# Patient Record
Sex: Female | Born: 1950 | Race: White | Hispanic: No | Marital: Married | Smoking: Former smoker
Health system: Southern US, Community
[De-identification: ages and names within clinical notes are randomized; demographics above are authoritative.]

## PROBLEM LIST (undated history)

## (undated) DIAGNOSIS — R32 Unspecified urinary incontinence: Secondary | ICD-10-CM

## (undated) DIAGNOSIS — E785 Hyperlipidemia, unspecified: Secondary | ICD-10-CM

## (undated) DIAGNOSIS — K5790 Diverticulosis of intestine, part unspecified, without perforation or abscess without bleeding: Secondary | ICD-10-CM

## (undated) DIAGNOSIS — M1990 Unspecified osteoarthritis, unspecified site: Secondary | ICD-10-CM

## (undated) DIAGNOSIS — K759 Inflammatory liver disease, unspecified: Secondary | ICD-10-CM

## (undated) DIAGNOSIS — J301 Allergic rhinitis due to pollen: Secondary | ICD-10-CM

## (undated) DIAGNOSIS — T8859XA Other complications of anesthesia, initial encounter: Secondary | ICD-10-CM

## (undated) DIAGNOSIS — E119 Type 2 diabetes mellitus without complications: Secondary | ICD-10-CM

## (undated) DIAGNOSIS — Z8371 Family history of colonic polyps: Secondary | ICD-10-CM

## (undated) DIAGNOSIS — T7840XA Allergy, unspecified, initial encounter: Secondary | ICD-10-CM

## (undated) DIAGNOSIS — B019 Varicella without complication: Secondary | ICD-10-CM

## (undated) DIAGNOSIS — G43909 Migraine, unspecified, not intractable, without status migrainosus: Secondary | ICD-10-CM

## (undated) DIAGNOSIS — K2270 Barrett's esophagus without dysplasia: Secondary | ICD-10-CM

## (undated) DIAGNOSIS — K5792 Diverticulitis of intestine, part unspecified, without perforation or abscess without bleeding: Secondary | ICD-10-CM

## (undated) DIAGNOSIS — H269 Unspecified cataract: Secondary | ICD-10-CM

## (undated) DIAGNOSIS — K743 Primary biliary cirrhosis: Secondary | ICD-10-CM

## (undated) DIAGNOSIS — Z83719 Family history of colon polyps, unspecified: Secondary | ICD-10-CM

## (undated) DIAGNOSIS — F419 Anxiety disorder, unspecified: Secondary | ICD-10-CM

## (undated) DIAGNOSIS — T4145XA Adverse effect of unspecified anesthetic, initial encounter: Secondary | ICD-10-CM

## (undated) DIAGNOSIS — K219 Gastro-esophageal reflux disease without esophagitis: Secondary | ICD-10-CM

## (undated) HISTORY — DX: Hyperlipidemia, unspecified: E78.5

## (undated) HISTORY — DX: Unspecified urinary incontinence: R32

## (undated) HISTORY — DX: Barrett's esophagus without dysplasia: K22.70

## (undated) HISTORY — DX: Primary biliary cirrhosis: K74.3

## (undated) HISTORY — DX: Family history of colonic polyps: Z83.71

## (undated) HISTORY — PX: VAGINAL HYSTERECTOMY: SUR661

## (undated) HISTORY — DX: Unspecified osteoarthritis, unspecified site: M19.90

## (undated) HISTORY — DX: Varicella without complication: B01.9

## (undated) HISTORY — DX: Family history of colon polyps, unspecified: Z83.719

## (undated) HISTORY — PX: JOINT REPLACEMENT: SHX530

## (undated) HISTORY — DX: Inflammatory liver disease, unspecified: K75.9

## (undated) HISTORY — DX: Diverticulitis of intestine, part unspecified, without perforation or abscess without bleeding: K57.92

## (undated) HISTORY — DX: Migraine, unspecified, not intractable, without status migrainosus: G43.909

## (undated) HISTORY — PX: ROTATOR CUFF REPAIR: SHX139

## (undated) HISTORY — PX: TUBAL LIGATION: SHX77

## (undated) HISTORY — DX: Allergic rhinitis due to pollen: J30.1

## (undated) HISTORY — PX: BLEPHAROPLASTY: SUR158

## (undated) HISTORY — DX: Diverticulosis of intestine, part unspecified, without perforation or abscess without bleeding: K57.90

## (undated) HISTORY — PX: CATARACT EXTRACTION: SUR2

## (undated) HISTORY — DX: Anxiety disorder, unspecified: F41.9

## (undated) HISTORY — DX: Unspecified cataract: H26.9

## (undated) HISTORY — PX: BLADDER SUSPENSION: SHX72

## (undated) HISTORY — DX: Type 2 diabetes mellitus without complications: E11.9

## (undated) HISTORY — DX: Allergy, unspecified, initial encounter: T78.40XA

## (undated) HISTORY — PX: ABDOMINAL HYSTERECTOMY: SHX81

## (undated) HISTORY — PX: TONSILLECTOMY: SUR1361

## (undated) HISTORY — PX: FACIAL COSMETIC SURGERY: SHX629

## (undated) HISTORY — PX: CARPAL TUNNEL RELEASE: SHX101

## (undated) HISTORY — DX: Gastro-esophageal reflux disease without esophagitis: K21.9

## (undated) HISTORY — PX: EYE SURGERY: SHX253

## (undated) HISTORY — PX: REDUCTION MAMMAPLASTY: SUR839

## (undated) HISTORY — PX: KNEE ARTHROSCOPY: SUR90

---
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
## 1982-07-10 HISTORY — PX: BREAST SURGERY: SHX581

## 1997-12-22 ENCOUNTER — Encounter: Admission: RE | Admit: 1997-12-22 | Discharge: 1997-12-22 | Payer: Self-pay | Admitting: Sports Medicine

## 1998-03-22 ENCOUNTER — Ambulatory Visit (HOSPITAL_COMMUNITY): Admission: RE | Admit: 1998-03-22 | Discharge: 1998-03-22 | Payer: Self-pay | Admitting: Gynecology

## 1998-11-22 ENCOUNTER — Ambulatory Visit (HOSPITAL_COMMUNITY): Admission: RE | Admit: 1998-11-22 | Discharge: 1998-11-22 | Payer: Self-pay | Admitting: Specialist

## 1998-11-30 ENCOUNTER — Other Ambulatory Visit: Admission: RE | Admit: 1998-11-30 | Discharge: 1998-11-30 | Payer: Self-pay | Admitting: Specialist

## 1999-01-17 ENCOUNTER — Other Ambulatory Visit: Admission: RE | Admit: 1999-01-17 | Discharge: 1999-01-17 | Payer: Self-pay | Admitting: Gynecology

## 2000-02-03 ENCOUNTER — Other Ambulatory Visit: Admission: RE | Admit: 2000-02-03 | Discharge: 2000-02-03 | Payer: Self-pay | Admitting: Gynecology

## 2000-02-16 ENCOUNTER — Ambulatory Visit (HOSPITAL_COMMUNITY): Admission: RE | Admit: 2000-02-16 | Discharge: 2000-02-16 | Payer: Self-pay | Admitting: Gynecology

## 2000-02-16 ENCOUNTER — Encounter: Payer: Self-pay | Admitting: Gynecology

## 2000-09-19 ENCOUNTER — Encounter: Admission: RE | Admit: 2000-09-19 | Discharge: 2000-09-19 | Payer: Self-pay | Admitting: Internal Medicine

## 2000-09-19 ENCOUNTER — Encounter: Payer: Self-pay | Admitting: Internal Medicine

## 2001-02-04 ENCOUNTER — Other Ambulatory Visit: Admission: RE | Admit: 2001-02-04 | Discharge: 2001-02-04 | Payer: Self-pay | Admitting: Gynecology

## 2001-02-18 ENCOUNTER — Encounter: Payer: Self-pay | Admitting: Gynecology

## 2001-02-18 ENCOUNTER — Ambulatory Visit (HOSPITAL_COMMUNITY): Admission: RE | Admit: 2001-02-18 | Discharge: 2001-02-18 | Payer: Self-pay | Admitting: Gynecology

## 2001-07-09 ENCOUNTER — Ambulatory Visit (HOSPITAL_BASED_OUTPATIENT_CLINIC_OR_DEPARTMENT_OTHER): Admission: RE | Admit: 2001-07-09 | Discharge: 2001-07-09 | Payer: Self-pay | Admitting: Orthopedic Surgery

## 2002-02-05 ENCOUNTER — Other Ambulatory Visit: Admission: RE | Admit: 2002-02-05 | Discharge: 2002-02-05 | Payer: Self-pay | Admitting: Obstetrics and Gynecology

## 2002-02-20 ENCOUNTER — Encounter: Payer: Self-pay | Admitting: Obstetrics and Gynecology

## 2002-02-20 ENCOUNTER — Ambulatory Visit (HOSPITAL_COMMUNITY): Admission: RE | Admit: 2002-02-20 | Discharge: 2002-02-20 | Payer: Self-pay | Admitting: Obstetrics and Gynecology

## 2003-02-11 ENCOUNTER — Other Ambulatory Visit: Admission: RE | Admit: 2003-02-11 | Discharge: 2003-02-11 | Payer: Self-pay | Admitting: Obstetrics and Gynecology

## 2004-02-22 ENCOUNTER — Other Ambulatory Visit: Admission: RE | Admit: 2004-02-22 | Discharge: 2004-02-22 | Payer: Self-pay | Admitting: Obstetrics and Gynecology

## 2004-02-23 ENCOUNTER — Encounter: Admission: RE | Admit: 2004-02-23 | Discharge: 2004-02-23 | Payer: Self-pay | Admitting: Obstetrics and Gynecology

## 2005-03-14 ENCOUNTER — Encounter: Admission: RE | Admit: 2005-03-14 | Discharge: 2005-03-14 | Payer: Self-pay | Admitting: Obstetrics and Gynecology

## 2005-04-04 ENCOUNTER — Other Ambulatory Visit: Admission: RE | Admit: 2005-04-04 | Discharge: 2005-04-04 | Payer: Self-pay | Admitting: Obstetrics and Gynecology

## 2005-07-06 ENCOUNTER — Ambulatory Visit (HOSPITAL_COMMUNITY): Admission: RE | Admit: 2005-07-06 | Discharge: 2005-07-06 | Payer: Self-pay | Admitting: Orthopaedic Surgery

## 2005-07-20 ENCOUNTER — Ambulatory Visit (HOSPITAL_BASED_OUTPATIENT_CLINIC_OR_DEPARTMENT_OTHER): Admission: RE | Admit: 2005-07-20 | Discharge: 2005-07-20 | Payer: Self-pay | Admitting: Orthopaedic Surgery

## 2005-08-14 ENCOUNTER — Encounter: Admission: RE | Admit: 2005-08-14 | Discharge: 2005-11-12 | Payer: Self-pay | Admitting: Orthopaedic Surgery

## 2007-01-27 ENCOUNTER — Emergency Department (HOSPITAL_COMMUNITY): Admission: EM | Admit: 2007-01-27 | Discharge: 2007-01-27 | Payer: Self-pay | Admitting: Family Medicine

## 2007-10-16 ENCOUNTER — Ambulatory Visit (HOSPITAL_COMMUNITY): Admission: RE | Admit: 2007-10-16 | Discharge: 2007-10-16 | Payer: Self-pay | Admitting: Obstetrics and Gynecology

## 2008-07-06 ENCOUNTER — Ambulatory Visit (HOSPITAL_COMMUNITY): Admission: RE | Admit: 2008-07-06 | Discharge: 2008-07-07 | Payer: Self-pay | Admitting: Obstetrics and Gynecology

## 2008-07-06 ENCOUNTER — Encounter (INDEPENDENT_AMBULATORY_CARE_PROVIDER_SITE_OTHER): Payer: Self-pay | Admitting: Obstetrics and Gynecology

## 2008-12-25 ENCOUNTER — Ambulatory Visit (HOSPITAL_COMMUNITY): Admission: RE | Admit: 2008-12-25 | Discharge: 2008-12-25 | Payer: Self-pay | Admitting: Obstetrics and Gynecology

## 2009-07-27 ENCOUNTER — Ambulatory Visit: Payer: Self-pay | Admitting: Family Medicine

## 2009-12-13 ENCOUNTER — Encounter (INDEPENDENT_AMBULATORY_CARE_PROVIDER_SITE_OTHER): Payer: Self-pay | Admitting: *Deleted

## 2010-01-13 ENCOUNTER — Encounter (INDEPENDENT_AMBULATORY_CARE_PROVIDER_SITE_OTHER): Payer: Self-pay | Admitting: *Deleted

## 2010-01-17 ENCOUNTER — Ambulatory Visit: Payer: Self-pay | Admitting: Internal Medicine

## 2010-01-31 ENCOUNTER — Ambulatory Visit: Payer: Self-pay | Admitting: Internal Medicine

## 2010-02-01 ENCOUNTER — Encounter: Payer: Self-pay | Admitting: Internal Medicine

## 2010-08-09 NOTE — Procedures (Signed)
Summary: Colonoscopy  Patient: Natasha Franco Note: All result statuses are Final unless otherwise noted.  Tests: (1) Colonoscopy (COL)   COL Colonoscopy           DONE     [facility]     [address]     [place], [place]  [address]           COLONOSCOPY PROCEDURE REPORT           PATIENT:  [name], [name]  MR#:  [id number]     BIRTHDATE:  08-11-1950, 58 yrs. old  GENDER:  female     ENDOSCOPIST:  Hedwig Morton. Juanda Chance, MD     REF. BY:  R. Robley Fries, M.D.     PROCEDURE DATE:  01/31/2010     PROCEDURE:  Colonoscopy 37106     ASA CLASS:  Class I     INDICATIONS:  Routine Risk Screening     MEDICATIONS:   Versed 7 mg, Fentanyl 75 mcg           DESCRIPTION OF PROCEDURE:   After the risks benefits and     alternatives of the procedure were thoroughly explained, informed     consent was obtained.  Digital rectal exam was performed and     revealed no rectal masses.   The LB CF-[id number] endoscope     was introduced through the anus and advanced to the cecum, which     was identified by both the appendix and ileocecal valve, without     limitations.  The quality of the prep was good, using MiraLax.     The instrument was then slowly withdrawn as the colon was fully     examined.     <<PROCEDUREIMAGES>>           FINDINGS:  Moderate diverticulosis was found in the sigmoid colon     (see image1). large folds and tortuous lumen,  Two polyps were     found in the sigmoid colon. at 20 cm diminutive 2 mm polyps The     polyps were removed using cold biopsy forceps (see image6).     Internal hemorrhoids were found (see image7).  This was otherwise a     normal examination of the colon (see image5, image4, image3, and     image2).   Retroflexed views in the rectum revealed no     abnormalities.    The scope was then withdrawn from the patient     and the procedure completed.           COMPLICATIONS:  None     ENDOSCOPIC IMPRESSION:     1) Moderate diverticulosis in the sigmoid  colon     2) Two polyps in the sigmoid colon     3) Internal hemorrhoids     4) Otherwise normal examination     RECOMMENDATIONS:     1) Await pathology results     2) High fiber diet.     REPEAT EXAM:  In 10 year(s) for.           ______________________________     Hedwig Morton. Juanda Chance, MD           CC:           n.     eSIGNED:   Hedwig Morton. Mustaf Antonacci at 01/31/2010 11:26 AM           Orlando Penner, [id number]  Note: An exclamation mark (!) indicates a result that  was not dispersed into the flowsheet. Document Creation Date: 01/31/2010 11:26 AM _______________________________________________________________________  (1) Order result status: Final Collection or observation date-time: 01/31/2010 11:18 Requested date-time:  Receipt date-time:  Reported date-time:  Referring Physician:   Ordering Physician: Lina Sar [phone]) Specimen Source:  Source: Launa Grill Order Number: [id number] Lab site:   Appended Document: Colonoscopy     Procedures Next Due Date:    Colonoscopy: 02/2020

## 2010-08-09 NOTE — Letter (Signed)
Summary: [facility] Instructions  [facility]  [address] [place], [place] [address]   Phone: [phone]  Fax: [phone]       Natasha Franco    23-May-1951    MRN: [id number]       Procedure Day /Date:  01/31/10  Monday     Arrival Time:  9:30am     Procedure Time: 10:30am     Location of Procedure:                    _ x_  [facility] (4th Floor)    PREPARATION FOR COLONOSCOPY WITH MIRALAX  Starting 5 days prior to your procedure _ 7/20/11_ do not eat nuts, seeds, popcorn, corn, beans, peas,  salads, or any raw vegetables.  Do not take any fiber supplements (e.g. Metamucil, Citrucel, and Benefiber). ____________________________________________________________________________________________________   THE DAY BEFORE YOUR PROCEDURE         DATE:  01/30/10  DAY: SUNDAY  1   Drink clear liquids the entire day-NO SOLID FOOD  2   Do not drink anything colored red or purple.  Avoid juices with pulp.  No orange juice.  3   Drink at least 64 oz. (8 glasses) of fluid/clear liquids during the day to prevent dehydration and help the prep work efficiently.  CLEAR LIQUIDS INCLUDE: Water Jello Ice Popsicles Tea (sugar ok, no milk/cream) Powdered fruit flavored drinks Coffee (sugar ok, no milk/cream) Gatorade Juice: apple, white grape, white cranberry  Lemonade Clear bullion, consomm, broth Carbonated beverages (any kind) Strained chicken noodle soup Hard Candy  4   Mix the entire bottle of Miralax with 64 oz. of Gatorade/Powerade in the morning and put in the refrigerator to chill.  5   At 3:00 pm take 2 Dulcolax/Bisacodyl tablets.  6   At 4:30 pm take one Reglan/Metoclopramide tablet.  7  Starting at 5:00 pm drink one 8 oz glass of the Miralax mixture every 15-20 minutes until you have finished drinking the entire 64 oz.  You should finish drinking prep around 7:30 or 8:00 pm.  8   If you are nauseated, you may take the 2nd Reglan/Metoclopramide  tablet at 6:30 pm.        9    At 8:00 pm take 2 more DULCOLAX/Bisacodyl tablets.     THE DAY OF YOUR PROCEDURE      DATE:   01/31/10  DAY:  Monday  You may drink clear liquids until  8:30am   (2 HOURS BEFORE PROCEDURE).   MEDICATION INSTRUCTIONS  Unless otherwise instructed, you should take regular prescription medications with a small sip of water as early as possible the morning of your procedure.         OTHER INSTRUCTIONS  You will need a responsible adult at least 59 years of age to accompany you and drive you home.   This person must remain in the waiting room during your procedure.  Wear loose fitting clothing that is easily removed.  Leave jewelry and other valuables at home.  However, you may wish to bring a book to read or an iPod/MP3 player to listen to music as you wait for your procedure to start.  Remove all body piercing jewelry and leave at home.  Total time from sign-in until discharge is approximately 2-3 hours.  You should go home directly after your procedure and rest.  You can resume normal activities the day after your procedure.  The day of your procedure you  should not:   Drive   Make legal decisions   Operate machinery   Drink alcohol   Return to work  You will receive specific instructions about eating, activities and medications before you leave.   The above instructions have been reviewed and explained to me by   Wyona Almas RN  January 17, 2010 9:57 AM     I fully understand and can verbalize these instructions _____________________________ Date _______

## 2010-08-09 NOTE — Assessment & Plan Note (Signed)
Summary: WGT MGT CLASS WITH DR Xyla Leisner/[facility]   

## 2010-08-09 NOTE — Letter (Signed)
Summary: Patient Notice- Polyp Results  [facility]  [address] [place], [place] [address]   Phone: [phone]  Fax: [phone]        February 01, 2010 MRN: [id number]    [facility] [address] [place], [place]  [address]    Dear Ms. Canizales,  I am pleased to inform you that the colon polyp(s) removed during your recent colonoscopy was (were) found to be benign (no cancer detected) upon pathologic examination.The polyp was hyperplastic, ( not precancerous)  I recommend you have a repeat colonoscopy examination in 10_ years to look for recurrent polyps, as having colon polyps increases your risk for having recurrent polyps or even colon cancer in the future.  Should you develop new or worsening symptoms of abdominal pain, bowel habit changes or bleeding from the rectum or bowels, please schedule an evaluation with either your primary care physician or with me.  Additional information/recommendations:  _x_ No further action with gastroenterology is needed at this time. Please      follow-up with your primary care physician for your other healthcare      needs.  __ Please call [phone] to schedule a return visit to review your      situation.  __ Please keep your follow-up visit as already scheduled.  __ Continue treatment plan as outlined the day of your exam.  Please call us if you are having persistent problems or have questions about your condition that have not been fully answered at this time.  Sincerely,  Hart Carwin MD  This letter has been electronically signed by your physician.  Appended Document: Patient Notice- Polyp Results letter mailed

## 2010-08-09 NOTE — Miscellaneous (Signed)
Summary: [facility] Previsit/prep  Clinical Lists Changes  Medications: Added new medication of DULCOLAX 5 MG  TBEC (BISACODYL) Day before procedure take 2 at 3pm and 2 at 8pm. - Signed Added new medication of METOCLOPRAMIDE HCL 10 MG  TABS (METOCLOPRAMIDE HCL) As per prep instructions. - Signed Added new medication of MIRALAX   POWD (POLYETHYLENE GLYCOL 3350) As per prep  instructions. - Signed Rx of DULCOLAX 5 MG  TBEC (BISACODYL) Day before procedure take 2 at 3pm and 2 at 8pm.;  #4 x 0;  Signed;  Entered by: Wyona Almas RN;  Authorized by: Hart Carwin MD;  Method used: Electronically to [facility] [address] [place], [place]  [address], Ph: [phone] or [phone], Fax: [phone] Rx of METOCLOPRAMIDE HCL 10 MG  TABS (METOCLOPRAMIDE HCL) As per prep instructions.;  #2 x 0;  Signed;  Entered by: Wyona Almas RN;  Authorized by: Hart Carwin MD;  Method used: Electronically to [facility] [address] [place], [place]  [address], Ph: [phone] or [phone], Fax: [phone] Rx of MIRALAX   POWD (POLYETHYLENE GLYCOL 3350) As per prep  instructions.;  #255gm x 0;  Signed;  Entered by: Wyona Almas RN;  Authorized by: Hart Carwin MD;  Method used: Electronically to [facility] [address], [place], [place]  [address], Ph: [phone] or [phone], Fax: [phone] Allergies: Added new allergy or adverse reaction of WELLBUTRIN Added new allergy or adverse reaction of * TETANUS Added new allergy or adverse reaction of * CODEINE (ALONE) Observations: Added new observation of NKA: F (01/17/2010 9:13)    Prescriptions: MIRALAX   POWD (POLYETHYLENE GLYCOL 3350) As per prep  instructions.  #255gm x 0   Entered by:   Wyona Almas RN   Authorized by:   Hart Carwin MD   Signed by:   Wyona Almas RN on 01/17/2010  Method used:   Electronically to        [facility]* (retail)       [address] [place], [place]  [address]       Ph: [phone] or [phone]       Fax: [phone]   RxID:   [id number] METOCLOPRAMIDE HCL 10 MG  TABS (METOCLOPRAMIDE HCL) As per prep instructions.  #2 x 0   Entered by:   Wyona Almas RN   Authorized by:   Hart Carwin MD   Signed by:   Wyona Almas RN on 01/17/2010   Method used:   Electronically to        [facility]* (retail)       [address] [place], [place]  [address]       Ph: [phone] or [phone]       Fax: [phone]   RxID:   [id number] DULCOLAX 5 MG  TBEC (BISACODYL) Day before procedure take 2 at 3pm and 2 at 8pm.  #4 x 0   Entered by:   Wyona Almas RN   Authorized by:   Hart Carwin MD   Signed by:   Wyona Almas RN on 01/17/2010   Method used:  Electronically to        [facility]* (retail)       [address] [place], [place]  [address]       Ph: [phone] or [phone]       Fax: [phone]   RxID:   [id number]

## 2010-08-09 NOTE — Letter (Signed)
Summary: Previsit letter  [facility]  [address] [place], [place] [address]   Phone: [phone]  Fax: [phone]       12/13/2009 MRN: [id number]  Natasha Franco [address] [place], [place]  [address]  Dear [name],  Welcome to the Gastroenterology Division at [facility].    You are scheduled to see a nurse for your pre-procedure visit on 01/17/2010 at 9:30AM on the 3rd floor at [facility], [address]  We ask that you try to arrive at our office 15 minutes prior to your appointment time to allow for check-in.  Your nurse visit will consist of discussing your medical and surgical history, your immediate family medical history, and your medications.    Please bring a complete list of all your medications or, if you prefer, bring the medication bottles and we will list them.  We will need to be aware of both prescribed and over the counter drugs.  We will need to know exact dosage information as well.  If you are on blood thinners (Coumadin, Plavix, Aggrenox, Ticlid, etc.) please call our office today/prior to your appointment, as we need to consult with your physician about holding your medication.   Please be prepared to read and sign documents such as consent forms, a financial agreement, and acknowledgement forms.  If necessary, and with your consent, a friend or relative is welcome to sit-in on the nurse visit with you.  Please bring your insurance card so that we may make a copy of it.  If your insurance requires a referral to see a specialist, please bring your referral form from your primary care physician.  No co-pay is required for this nurse visit.     If you cannot keep your appointment, please call [phone] to cancel or reschedule prior to your appointment date.  This allows [place] the opportunity to schedule an appointment for another patient in need of care.    Thank you for choosing [facility] for your medical  needs.  We appreciate the opportunity to care for you.  Please visit [place] at our website  to learn more about our practice.                     Sincerely.                                                                                                                   The Gastroenterology Division

## 2010-09-24 LAB — GLUCOSE, CAPILLARY: Glucose-Capillary: 94 mg/dL (ref 70–99)

## 2010-10-17 LAB — CBC
HCT: 38 % (ref 36.0–46.0)
MCHC: 34.4 g/dL (ref 30.0–36.0)
Platelets: 312 10*3/uL (ref 150–400)
RBC: 4.49 MIL/uL (ref 3.87–5.11)

## 2010-11-22 NOTE — Discharge Summary (Signed)
Natasha Franco, Natasha Franco                  ACCOUNT NO.:  [id number]   MEDICAL RECORD NO.:  [id number]          PATIENT TYPE:  OIB   LOCATION:  9318                          FACILITY:  [facility]   PHYSICIAN:  Guy Sandifer. Henderson Cloud, M.D. DATE OF BIRTH:  1951/02/22   DATE OF ADMISSION:  07/06/2008  DATE OF DISCHARGE:  07/07/2008                               DISCHARGE SUMMARY   ADMITTING DIAGNOSES:  Stress urinary continence and pelvic relaxation.   DISCHARGE DIAGNOSES:  Stress urinary continence and pelvic relaxation.   PROCEDURE:  On July 06, 2008, laparoscopically-assisted vaginal  hysterectomy, bilateral salpingo-oophorectomy, anterior colporrhaphy,  colpopexy, insertion of mesh, mid urethral sling, and cystoscopy.   REASON FOR ADMISSION:  This patient is a 60 year old married white  female, G3, P2, with increasingly symptomatic incontinence and pelvic  relaxation.  Details are dictated in the history and physical.  She was  admitted for surgical management.   HOSPITAL COURSE:  The patient was admitted to the hospital to undergo  the above procedure.  Estimated blood loss is 300 mL.  On the evening of  surgery, she is not using any pain medicine.  She feels good.  She is  ambulating in the hallways.  She is tolerating a regular diet, but not  yet passing flatus.  Vital signs are stable, and urine output is clear.  On the day of discharge, vaginal packing has been removed.  She is  tolerating regular diet, passing flatus, and ambulating.  She is voiding  well with residual urine volumes of 7 and 37 mL, respectively.  Vital  signs are stable.  She is afebrile.  Hemoglobin is 10.1.  Pathology is  pending.   CONDITION ON DISCHARGE:  Good.   DIET:  Regular as tolerated.   ACTIVITY:  No lifting, no operation of automobiles, no vaginal entry.   She is to call the office for problems including but not limited to  heavy bleeding, temperature of 101 degrees, persistent nausea, vomiting,  or  increasing pain.   MEDICATIONS:  1. Percocet 5/325 mg #30 one to two p.o. q.6 h. p.r.n.  2. Ibuprofen 600 mg q.6 h. p.r.n.  3. Multivitamin daily.  4. Stool softener daily.   Followup is in the office in 2 weeks.  The patient also had  hemorrhoidectomy with Dr. Daphine Deutscher and will follow up with Dr. Daphine Deutscher as  directed.      Guy Sandifer Henderson Cloud, M.D.  Electronically Signed     JET/MEDQ  D:  07/07/2008  T:  07/07/2008  Job:  [id number]   cc:   [place] Daphine Deutscher, MD  [address], [address]  [place]  [place] [address]

## 2010-11-22 NOTE — Op Note (Signed)
NAMEROXIE, KREEGER                  ACCOUNT NO.:  [id number]   MEDICAL RECORD NO.:  [id number]          PATIENT TYPE:  AMB   LOCATION:  SDC                           F[facility]ILITY:  [facility]   PHYSICIAN:  Guy Sandifer. Henderson Cloud, M.D. DATE OF BIRTH:  04/21/1951   DATE OF PROCEDURE:  12/25/2008  DATE OF DISCHARGE:                               OPERATIVE REPORT   PREOPERATIVE DIAGNOSIS:  Erosion of mid-urethral tape.   POSTOPERATIVE DIAGNOSIS:  Erosion of mid-urethral tape.   PROCEDURE:  Repair of erosion of mid-urethral tape.   SURGEON:  Guy Sandifer. Henderson Cloud, MD   ANESTHESIA:  MAC.   ESTIMATED BLOOD LOSS:  Drops.   INDICATIONS AND CONSENTS:  The patient is a 60 year old married white  female, G3, P2, status post LAVH, BSO, A and P repair with a pole graft  and mid-urethral sling.  She has an erosion of the mid-urethral sling.  Details dictated in history and physical.  Repair of this has been  discussed.  Potential risks and complications have been discussed  preoperatively including but limited to infection, organ damage,  bleeding requiring transfusion of blood products, recurrent erosion,  recurrent stress incontinence, dyspareunia, and pelvic pain.  All  questions have been answered and consent was signed on the chart.   PROCEDURE:  The patient was taken to the operating room where she was  identified, placed in dorsal supine position and given intravenous  sedation.  She was then placed in dorsal lithotomy position where she  was gently prepped, bladder straight catheterized and draped in sterile  fashion.  Time-out undertaken.  Careful inspection revealed  approximately a 2 mm margin of the anterior edge of the mid-urethral  sling to be eroded through for a width of approximately 6 mm.  This was  resected in a simple fashion with Adson pickups and Metzenbaum scissors  which immediately removed all the visible mesh.  Careful digital  inspection failed to identify any further graft.  None could  be palpated  protruding through the mucosa.  The surrounding edges of the mucosa were  injected with approximately 2 mL of 1% plain Xylocaine.  The edge of the  vaginal mucosa along the portion where the erosion had taken place was  released with a scalpel in a superficial fashion that just goes through  the mucosa.  This well cleared the urethra.  These edges were then  reapproximated in a running fashion with a 2-0 Monocryl suture.  Good  hemostasis was noted.  All counts were correct.  The patient was  awakened, taken to recovery room in stable condition.      Guy Sandifer Henderson Cloud, M.D.  Electronically Signed    JET/MEDQ  D:  12/25/2008  T:  12/25/2008  Job:  [id number]

## 2010-11-22 NOTE — Op Note (Signed)
Natasha Franco, Natasha Franco                  ACCOUNT NO.:  [id number]   MEDICAL RECORD NO.:  [id number]          PATIENT TYPE:  OIB   LOCATION:  9318                          FACILITY:  [facility]   PHYSICIAN:  Guy Sandifer. Henderson Cloud, M.D. DATE OF BIRTH:  11-19-1950   DATE OF PROCEDURE:  DATE OF DISCHARGE:                               OPERATIVE REPORT   PREOPERATIVE DIAGNOSES:  Stress urinary continence and pelvic  relaxation.   POSTOPERATIVE DIAGNOSES:  Stress urinary continence and pelvic  relaxation.   PROCEDURE:  Laparoscopically-assisted vaginal hysterectomy, bilateral  salpingo-oophorectomy, anterior colporrhaphy, colpopexy, insertion of  mesh, mid urethral sling (Solyx), and cystoscopy.   SURGEON:  Guy Sandifer. Henderson Cloud, MD   ANESTHESIA:  General with endotracheal intubation.   ASSISTANT:  Duke Salvia. Marcelle Overlie, MD   SPECIMENS:  Uterus, bilateral tubes and ovaries to Pathology.   ESTIMATED BLOOD LOSS:  300 mL.   INDICATIONS AND CONSENT:  This patient is a 60 year old married white  female, G3, P2, postmenopausal with increasingly symptomatic pelvic  relaxation and stress urinary continence.  Details are dictated in the  history and physical.  Preoperatively, laparoscopically-assisted vaginal  hysterectomy, bilateral salpingo-oophorectomy, anterior and posterior  repair with probable insertion of mesh, mid urethral sling was  discussed.  Potential risks and complications have been discussed  preoperatively including, but not limited to infection, organ damage,  bleeding requiring transfusion of blood products with HIV and hepatitis  acquisition, DVT, PE, pneumonia, fistula formation, postoperative pain,  dyspareunia, vaginal narrowing, need for dilators, success and failure  rates of the sling, delayed healing, erosion, possible need to return to  the OR, possible prolonged catheterization, self catheterization,  recurrent stress incontinence, pudendal pain.  All questions have been  answered  and consent was signed on the chart.   FINDINGS:  Upper abdomen is grossly normal.  Uterus is 6 weeks in size.  Tubes and ovaries were normal bilaterally status post ligation.  Anterior and posterior cul-de-sacs were normal.   PROCEDURE:  The patient was taken to the operating room where she was  identified, placed in dorsal supine position, and general anesthesia was  induced via endotracheal intubation.  She was then placed in the dorsal  lithotomy position where she was prepped abdominally and vaginally.  Bladder straight catheterized.  Hulka tenaculum was placed.  The uterus  was then manipulated.  She was draped in a sterile fashion.  The  infraumbilical and suprapubic areas were injected in the midline with  0.5% plain Marcaine.  A small infraumbilical incision was made, and a  disposable Veress needle was placed on the first attempt without  difficulty.  Placement was verified with a drop test.  Two liters of gas  were then insufflated under low pressure with good tympany in the right  upper quadrant.  Veress needle was removed.  A 10/11 Xcel bladeless  disposable trocar sleeve was placed using direct visualization with the  diagnostic laparoscope.  After placement, the operative laparoscope was  placed.  A small suprapubic incision was made, and a 5-mm Xcel bladeless  disposable trocar sleeve was placed under  direct visualization without  difficulty.  The above findings were noted.  Course of the ureters were  seen to be well clear of surgery.  Then using the EnSeal bipolar cautery  cutting product, the right infundibulopelvic ligament was taken down,  taken across the mesosalpinx across the round ligament down the  vesicouterine peritoneum.  A similar procedure was carried out on the  left side.  Good hemostasis was maintained.  Vesicouterine peritoneum  was taken down cephalolaterally.  Suprapubic trocar sleeve was removed.  Instruments were removed, and attention was turned to  the pelvis.  Posterior cul-de-sac was entered sharply, and the cervix circumscribed  with unipolar cautery.  Mucosa was advanced sharply and bluntly.  Then  using the Gyrus bipolar cautery instrument, uterosacral ligaments were  taken down bilaterally.  Anterior cul-de-sac was then entered without  difficulty.  Progressive bites of the bladder pillars, cardinal  ligaments, uterine vessels were taken bilaterally.  Fundus was delivered  posteriorly.  Remaining pedicles were taken down.  The specimen was  delivered.  Uterosacral ligaments were then plicated, vaginal cuff  bilaterally with separate sutures of 0 Monocryl.  All suture will be 0  Monocryl unless otherwise designated.  The uterosacral ligaments were  then plicated in the midline with a third suture.  Cuff was closed with  figure-of-eights.  The anterior vaginal mucosa was then injected with 1%  Marcaine with 1:200,000 epinephrine.  A transverse vaginal incision was  made approximately halfway up on the anterior vaginal wall.  The mucosa  was dissected sharply and bluntly from the bladder.  Blunt dissection  was carried out bilaterally to the sacrospinous ligaments bilaterally.  Then using the Uphold polypropylene mesh graft, the arms were placed  through the sacrospinous ligaments bilaterally using the Capio needle  driver.  This was placed at least 1 fingerbreadth medial to the spines  bilaterally.  The arms were withdrawn bilaterally.  The midline of the  mesh was anchored to the posterior part of the vaginal cuff on its  superior edge with 0 Vicryl suture.  The graft was then seated so that  the midline was well positioned.  The vaginal cuff was elevated, but  there was no excessive tension on the arms.  The graft was lying flat  with no bunching or rolling.  The sheath was then removed intact from  the arms bilaterally as well as the sutures removed intact with the  sheath.  Excess length of the arms were trimmed bilaterally.   The cuff  was then closed with running locking 2-0 Monocryl suture.  Foley  catheter was then placed in the bladder and drained.  Catheter was left  in place.  After injecting the suburethral vaginal mucosa with the same  solution as above, a small linear incision was made below the course of  the urethra.  Dissection was carried out bilaterally with Strully  scissors to the level of the urogenital diaphragm bilaterally.  Then  using the Solyx, polypropylene mesh, mid urethral sling, the right side  of the graft was placed through the urogenital diaphragm.  Then, placed  on the left as well, and when proper tension was noted, the driver was  released out.  The graft was seen to be lying flat with no bunching or  rolling.  It was just touching the urethra, but there was no tension on  the graft.  The mucosa was closed with running locking 2-0 Monocryl  suture.  Foley catheter was then removed, and  a 70-degree cystoscope was  used to perform cystoscopy.  A 360-degree inspection reveals no evidence  of foreign body, no evidence of bladder perforation.  A good puff of  urine was noted from the ureters bilaterally.  The cystoscope was  removed.  Foley catheters were placed and left to straight drain.  A 1-  inch vaginal packing with estrogen cream was then placed in the vagina.  Attention was then turned to the abdomen.  Pneumoperitoneum was  recreated.  The laparoscope was then reinserted, and the suprapubic  trocar sleeve was reinserted under direct visualization.  Careful  inspection and irrigation reveals excellent hemostasis all around.  Excess fluid was removed.  Inspection under reduced pneumoperitoneum  also revealed good hemostasis.  Pneumoperitoneum was reduced.  Suprapubic trocar sleeves were removed.  Skin incisions were closed with  interrupted subcuticular 2-0 Vicryl suture.  Dermabond was placed on all  incisions.  At this point, all counts were correct.  The patient was   stable, and Dr. Daphine Deutscher entered the room to perform hemorrhoidectomy.      Guy Sandifer Henderson Cloud, M.D.  Electronically Signed     JET/MEDQ  D:  07/06/2008  T:  07/06/2008  Job:  [id number]

## 2010-11-22 NOTE — Op Note (Signed)
Natasha Franco, Natasha Franco                  ACCOUNT NO.:  [id number]   MEDICAL RECORD NO.:  [id number]          PATIENT TYPE:  OIB   LOCATION:  9318                          FACILITY:  [facility]   PHYSICIAN:  Matthew B. Daphine Deutscher, MD  DATE OF BIRTH:  05-17-1951   DATE OF PROCEDURE:  DATE OF DISCHARGE:                               OPERATIVE REPORT   PREOPERATIVE DIAGNOSIS:  Large posterior external hemorrhoid.   PROCEDURE:  External hemorrhoidectomy and removal of excrescence.   SURGEON:  Thornton Park. Daphine Deutscher, MD   DESCRIPTION OF PROCEDURES:  At the end of Dr. Huel Coventry case, the  perianal region had been prepped with Betadine and was still draped.  There was a large probably 2-cm skin mass which appeared to be as an  external hemorrhoid, but with some probable warty excrescences on them  that I held out and retracted infiltrating the base of this and excised  it at the base cutting through normal skin.  This was sent in toto to  Pathology.  The remaining portion was cauterized, then closed with  running 3-0 chromic.  No bleeding was noted.  Some bacitracin ointment  was massaged into the incision.  The patient was awakened and then taken  to the recovery room.      Thornton Park Daphine Deutscher, MD  Electronically Signed     MBM/MEDQ  D:  07/06/2008  T:  07/06/2008  Job:  [id number]   cc:   Guy Sandifer. Henderson Cloud, M.D.  Fax: [phone]

## 2010-11-22 NOTE — H&P (Signed)
NAMEESTEPHANIE, HUBBS                  ACCOUNT NO.:  [id number]   MEDICAL RECORD NO.:  [id number]          PATIENT TYPE:  AMB   LOCATION:  SDC                           [facility]ILITY[facility]  [facility]   PHYSICIAN:  Guy Sandifer. Henderson Cloud, M.D. DATE OF BIRTH:  11/11/1950   DATE OF ADMISSION:  DATE OF DISCHARGE:                              HISTORY & PHYSICAL   CHIEF COMPLAINT:  Vaginal erosion.   HISTORY OF PRESENT ILLNESS:  This patient is a 60 year old married white  female G3, P2 status post LAVH, BSO, A and P repair with an uphold graft  and a mid urethral sling.  Postoperatively, she has felt well.  She has  no urinary incontinence.  However, there is a very small edge of the mid  urethral sling, which has eroded through the vaginal mucosa.  She has  been treated with vaginal estrogen cream, which is improved somewhat,  but not completely eliminated.  After discussion of options, she  presented for revision of this erosion.  Potential risks and  complications, potential for recurrence and painful intercourse has been  reviewed preoperatively.   PAST MEDICAL HISTORY:  1. History of migraine.  2. Reflux.   PAST SURGICAL HISTORY:  LAVH, BSO, A and P repair with grafts and TOT as  above, tonsillectomy, hysteroscopy, D and C, breast reduction, left knee  arthroscopy, right rotator cuff repair, right carpal tunnel repair, cyst  of thumb removed.   OBSTETRICAL HISTORY:  Cesarean section x2.   SOCIAL HISTORY:  The patient stopped smoking, has 3 drinks a week,  denies drug abuse.   FAMILY HISTORY:  Positive for heart disease, respiratory disease,  neurological disease, and diabetes.   MEDICATIONS:  Omnaris one spray daily, Chantix daily, vaginal estrogen  cream, Vivelle-Dot 0.05 mg, Topamax daily, ranitidine daily, loratadine  daily.   ALLERGIES:  WELLBUTRIN and TETANUS IMMUNIZATION.   REVIEW OF SYSTEMS:  NEURO:  Headache as above.  CARDIAC:  Denies chest  pain.  PULMONARY:  Denies shortness of  breath.   PHYSICAL EXAMINATION:  VITAL SIGNS:  Height 5 feet 2-1/2 inch, weight  196 pounds, blood pressure 110/70.  LUNGS:  Clear to auscultation.  HEART:  Regular rate and rhythm.  ABDOMEN:  Soft, nontender without masses.  PELVIC:  As above.  EXTREMITIES:  Grossly within normal limits.  NEUROLOGICAL:  Grossly within normal limits.   ASSESSMENT:  Vaginal erosion status post TOT.   PLAN:  Revision of vaginal erosions.      Guy Sandifer Henderson Cloud, M.D.  Electronically Signed     JET/MEDQ  D:  12/08/2008  T:  12/09/2008  Job:  [id number]

## 2010-11-22 NOTE — H&P (Signed)
Natasha Franco, Natasha Franco                  ACCOUNT NO.:  [id number]   MEDICAL RECORD NO.:  [id number]          PATIENT TYPE:  AMB   LOCATION:                                [facility]   PHYSICIAN:  Guy Sandifer. Henderson Cloud, M.D. DATE OF BIRTH:  [birth year]   DATE OF ADMISSION:  07/06/2008  DATE OF DISCHARGE:                              HISTORY & PHYSICAL   CHIEF COMPLAINT:  Pelvic relaxation symptomatic and urinary  incontinence.   HISTORY OF PRESENT ILLNESS:  The patient is a 60 year old married white  female G3, P2 who is postmenopausal who has increasingly symptomatic  leaking of urine with coughing and sneezing.  Urodynamic studies were  consistent with stress urinary continence.  She also has increasingly  symptomatic pelvic relaxation.  After discussion of options, she is  being admitted for laparoscopically-assisted vaginal hysterectomy,  bilateral salpingo-oophorectomy, anterior repair with probable grafts,  mid urethral sling, posterior vaginal repair.  Potential risks and  complications have been discussed preoperatively.   PAST MEDICAL HISTORY:  1. History of migraine headache.  2. Reflux.   PAST SURGICAL HISTORY:  Tonsillectomy, hysteroscopy, D&C, breast  reduction, left knee arthroscopy, right rotator cuff repair, right  carpal tunnel repair, cyst of thumb removed.   OBSTETRICAL HISTORY:  Cesarean section x2.   SOCIAL HISTORY:  Smokes half pack of cigarettes a day, three drinks a  week.  Denies drug abuse.   FAMILY HISTORY:  Positive for heart disease, respiratory disease,  neurologic disease, and diabetes.   MEDICATIONS:  Topamax, Claritin, Zomig p.r.n., and vitamins.   ALLERGIES:  1. WELLBUTRIN leading to itching  2. TETANUS, had fever at age 38 with tetanus immunization.  3. CODEINE, nausea, vomiting.  4. Vicodin and Percocet are okay   REVIEW OF SYSTEMS:  NEUROLOGIC:  Headache as above.  CARDIAC:  Denies  chest pain.  PULMONARY:  Denies shortness of breath.   PHYSICAL EXAMINATION:  VITAL SIGNS:  Height 5 feet 2-1/2 inches, weight  187 pounds, blood pressure 120/70.  HEENT: Without thyromegaly.  LUNGS:  Clear to auscultation.  HEART:  Regular rate and rhythm.  BACK:  No CVA tenderness.  BREASTS:  No mass, traction, discharge, status post reduction  bilaterally.  PELVIC:  Vagina and cervix without lesion.  There is a second-degree  cystocele.  Uterus is normal in size and mobile.  Adnexa nontender  without masses.  RECTOVAGINAL:  Adequate rectal sphincter tone and a rectocele.  EXTREMITIES:  Grossly within normal limits.  NEUROLOGICAL:  Grossly within normal limits.   ASSESSMENT:  1. Stress urinary continence.  2. Symptomatic pelvic relaxation.   PLAN:  Laparoscopically-assisted vaginal hysterectomy, bilateral  salpingo-oophorectomy, anterior repair with probable graft mid urethral  sling posterior repair.      Guy Sandifer Henderson Cloud, M.D.  Electronically Signed     JET/MEDQ  D:  06/25/2008  T:  06/26/2008  Job:  [id number]

## 2010-11-25 NOTE — Op Note (Signed)
Natasha Franco, Natasha Franco                  ACCOUNT NO.:  [id number]   MEDICAL RECORD NO.:  [id number]          PATIENT TYPE:  AMB   LOCATION:  DSC                          FACILITY:  [facility]   PHYSICIAN:  Lubertha Basque. Dalldorf, M.D.DATE OF BIRTH:  [birth year]   DATE OF PROCEDURE:  07/20/2005  DATE OF DISCHARGE:                                 OPERATIVE REPORT   PREOP DIAGNOSIS:  1.  Right shoulder impingement.  2.  Right shoulder AC degeneration.  3.  Right shoulder massive rotator cuff tear.   POSTOPERATIVE DIAGNOSIS:  1.  Right shoulder impingement.  2.  Right shoulder AC degeneration.  3.  Right shoulder massive rotator cuff tear.   PROCEDURES:  1.  Right shoulder arthroscopic acromioplasty.  2.  Right shoulder arthroscopic acromioclavicular resection.  3.  Right shoulder arthroscopic debridement.   ANESTHESIA:  General en bloc.   ATTENDING SURGEON:  Lubertha Basque. Jerl Santos, M.D.   ASSISTANT:  Lindwood Qua, P.A.   INDICATIONS FOR PROCEDURE:  The patient is a 60 year old woman with a long  history of right dominant shoulder pain. This has become acutely worse over  recent months. She is able to bring her arm up overhead, but weakens and has  pain in that position. She has difficulty, resting. She did respond in a  transient fashion to an injection and noticed improved strength and  decreased pain, thereafter. An MRI scan has confirmed a massive rotator cuff  tear which is significantly retracted. There is some edema within the muscle  belly as well. She has pain which limits her ability to rest and use her arm  up overhead and out to the side. She is offered an arthroscopy. Informed  operative consent was obtained after discussion of the possible  complications of, reaction to anesthesia and infection.   DESCRIPTION OF PROCEDURE:  The patient was taken to the operating suite  where general anesthetic was applied without difficulty. She was also given  a block in the preanesthesia area.  She was positioned in beach-chair  position and prepped and draped in normal sterile fashion. After the  administration of IV Kefzol an arthroscopy of the right shoulder was  performed through a total of three portals. Glenohumeral joint showed no  degenerative change; and the biceps tendon was intact throughout. She had a  massive rotator cuff tear which was retracted past the glenoid. She had a  prominent subacromial morphology dressed with an acromioplasty back to a  flat surface. This was done with a bur in the lateral position; followed by  transfer of the bur to posterior position. She also had a prominent spur at  the distal clavicle and narrowing of the [facility] joint.  A formal AC  decompression was done through the anterior portal with a bur. I then tried  to mobilize her rotator cuff into a position for repair. This was fairly  poor tissue and did not mobilize well. I used the shaver and probe to free  up some adhesions. With the arthroscopic grabber I was able to bring the  cuff tissue to perhaps the  top of the humeral head, but never anywhere close  to the greater tuberosity. It was felt best to debride this tissue out of  harm's way. The debridement was taken back, just slightly further over the  glenoid. The shoulder was thoroughly irrigated. Simple sutures of nylon were  used to loosely reapproximate the portals, followed by Adaptic, and a dry  gauze dressing with tape. Estimated blood loss and intraoperative fluids can  be obtained from anesthesia records.   DISPOSITION:  The patient was extubated in the operating room and taken to  recovery in stable condition. The plans were for her to go home in the same  day and to followup in the office in less than a week. I will contact her by  phone tonight.      Lubertha Basque Jerl Santos, M.D.  Electronically Signed     PGD/MEDQ  D:  07/20/2005  T:  07/21/2005  Job:  [id number]

## 2010-11-25 NOTE — H&P (Signed)
[facility]  Patient:    Natasha Franco, Natasha Franco                             MRN: [id number] Adm. Date:  02/17/00 Attending:  Marcial Pacas P. Fontaine, M.D.                         History and Physical  CHIEF COMPLAINT:              Pregnancy at term, with history of prior cesarean section, desires repeat cesarean section.  HISTORY OF PRESENT ILLNESS:   This 60 year old G 2, P 1 female at term gestation, with a history of prior cesarean section, low transverse cervical for a breech presentation, who desires a repeat cesarean section and tubal sterilization.  The risks, benefits, indications, and alternatives for a trial of labor, versus repeat cesarean section were discussed with her, and she elects for a repeat cesarean section and tubal sterilization.  PAST MEDICAL HISTORY:         None.  PAST SURGICAL HISTORY:        Cesarean section.  ALLERGIES:                    No known drug allergies.  CURRENT MEDICATIONS:          Vitamins.  REVIEW OF SYSTEMS:            Noncontributory.  PHYSICAL EXAMINATION:  HEENT:                        Normal.  LUNGS:                        Clear.  CARDIAC:                      A regular rate without rubs, murmurs, or gallops.  ABDOMEN:                      Gravid vertex fetus, positive fetal heart tones, consistent with term gestation.  PELVIC:                       Examination deferred.  ASSESSMENT:                   A 60 year old gravida 2, para 1 female with  a                               history of prior cesarean section for breech                               presentation, who was counseled for a trial                               of labor, versus repeat cesarean section, and                               elects for repeat cesarean section and tubal  sterilization.  The risks, benefits, indications, and alternatives were reviewed with her, to include the risks of bleeding,  transfusion, infection, prolonged antibiotics, wound complications, damage to internal organs, including bowel, bladder, ureters, vessels, and nerves, necessitating major exploratory reparative surgeries in the future.  Reparative surgeries including ostomy formation were all discussed and understood and accepted.  The patient desires a tubal sterilization.  The permanency of the procedure as well as the potential for failure was discussed with her, and she understands and accepts these possibilities, and would accept failure if it occurs.  The patients questions were answered to her satisfaction.  She is ready to proceed with surgery. DD:  02/16/00 TD:  02/16/00 Job: [id number]

## 2010-11-25 NOTE — Op Note (Signed)
[facility]  Patient:    Natasha Franco, Natasha Franco Visit Number: [id number] MRN: [id number]          Service Type: DSU Location: [facility] Attending Physician:  Ronne Binning Dictated by:   Nicki Reaper, M.D. Proc. Date: 07/09/01 Admit Date:  07/09/2001                             Operative Report  PREOPERATIVE DIAGNOSIS:  Carpal tunnel syndrome, right hand.  POSTOPERATIVE DIAGNOSIS:  Carpal tunnel syndrome, right hand.  PROCEDURE:  Decompression of the median nerve.  SURGEON:  Nicki Reaper, M.D.  ASSISTANT:  Joaquin Courts, R.N.  ANESTHESIA:  Forearm-based IV regional.  ANESTHESIOLOGIST:  Janetta Hora. Gelene Mink, M.D.  HISTORY:  The patient is a 60 year old female with a history of carpal tunnel syndrome, EMG/nerve conductions positive, which has not responded to conservative treatment.  DESCRIPTION OF PROCEDURE:  The patient was brought to the operating room, where a forearm-based IV regional anesthetic was carried out without difficulty.  She was prepped and draped using Betadine scrub and solution with the right arm free.  A longitudinal incision was made in the palm and carried down through subcutaneous tissue.  Bleeders were electrocauterized, palmar fascia was split, superficial palmar arch identified, the flexor tendon to the ring and little finger identified to the ulnar side of the median nerve.  The carpal retinaculum was incised with sharp dissection.  A right angle and Sewell retractor were placed between skin and forearm fascia.  The fascia was released for approximately 3 cm proximal to the wrist crease under direct vision.  No further lesions were identified.  The wound was irrigated.  The skin was closed with interrupted 5-0 nylon suture.  A sterile compressive dressing and splint were applied.  The patient tolerated the procedure well and was taken to the recovery room for observation in satisfactory condition. She is discharged home,  to return to the [facility] in one week on Vicodin and Keflex. Dictated by:   Nicki Reaper, M.D. Attending Physician:  Ronne Binning DD:  07/09/01 TD:  07/09/01 Job: [id number]

## 2011-04-14 LAB — COMPREHENSIVE METABOLIC PANEL
AST: 43 U/L — ABNORMAL HIGH (ref 0–37)
Alkaline Phosphatase: 92 U/L (ref 39–117)
CO2: 22 mEq/L (ref 19–32)
GFR calc Af Amer: >60 mL/min (ref >60)
Glucose, Bld: 131 mg/dL — ABNORMAL HIGH (ref 70–99)

## 2011-04-14 LAB — CBC
HCT: 30 % — ABNORMAL LOW (ref 36.0–46.0)
HCT: 38.5 % (ref 36.0–46.0)
MCV: 88.3 fL (ref 78.0–100.0)
Platelets: 285 10*3/uL (ref 150–400)
RBC: 3.37 MIL/uL — ABNORMAL LOW (ref 3.87–5.11)
RBC: 4.36 MIL/uL (ref 3.87–5.11)
RDW: 13.6 % (ref 11.5–15.5)

## 2012-07-10 HISTORY — PX: BREAST BIOPSY: SHX20

## 2012-11-26 DIAGNOSIS — J309 Allergic rhinitis, unspecified: Secondary | ICD-10-CM | POA: Insufficient documentation

## 2013-05-02 ENCOUNTER — Encounter (INDEPENDENT_AMBULATORY_CARE_PROVIDER_SITE_OTHER): Payer: Self-pay

## 2013-05-02 ENCOUNTER — Other Ambulatory Visit (INDEPENDENT_AMBULATORY_CARE_PROVIDER_SITE_OTHER): Payer: Self-pay

## 2013-05-02 ENCOUNTER — Telehealth (INDEPENDENT_AMBULATORY_CARE_PROVIDER_SITE_OTHER): Payer: Self-pay | Admitting: *Deleted

## 2013-05-02 ENCOUNTER — Encounter (INDEPENDENT_AMBULATORY_CARE_PROVIDER_SITE_OTHER): Payer: Self-pay | Admitting: Surgery

## 2013-05-02 ENCOUNTER — Ambulatory Visit (INDEPENDENT_AMBULATORY_CARE_PROVIDER_SITE_OTHER): Payer: BC Managed Care – PPO | Admitting: Surgery

## 2013-05-02 VITALS — BP 114/68 | HR 100 | Resp 16 | Ht 63.0 in | Wt 177.2 lb

## 2013-05-02 DIAGNOSIS — R921 Mammographic calcification found on diagnostic imaging of breast: Secondary | ICD-10-CM

## 2013-05-02 DIAGNOSIS — K745 Biliary cirrhosis, unspecified: Secondary | ICD-10-CM

## 2013-05-02 DIAGNOSIS — K743 Primary biliary cirrhosis: Secondary | ICD-10-CM

## 2013-05-02 DIAGNOSIS — E119 Type 2 diabetes mellitus without complications: Secondary | ICD-10-CM | POA: Insufficient documentation

## 2013-05-02 NOTE — Progress Notes (Signed)
Chief Complaint:  New breast microcalcifications in left breast-lower inner quadrant  History of Present Illness:  Natasha Franco is an 62 y.o. female who presents with her digital mammograms from [facility] in [place] with compression views showing micro calcifications in her left lower inner quadrant  that warrant further investigation.  She had reduction mammoplasties in 1984 by Dr. Louisa Second.  She denies any trauma or new palpable lesions in her breast.     Past Medical History  Diagnosis Date  . Diabetes mellitus without complication     Past Surgical History  Procedure Laterality Date  . Cesarean section    . Joint replacement    . Rotator cuff repair    . Carpal tunnel release    . Breast surgery Bilateral 1984    reduction mammoplasty    Current Outpatient Prescriptions  Medication Sig Dispense Refill  . escitalopram (LEXAPRO) 10 MG tablet Take 10 mg by mouth daily.      Marland Kitchen loratadine (CLARITIN) 10 MG tablet Take 10 mg by mouth daily.      . metFORMIN (GLUCOPHAGE) 500 MG tablet Take 500 mg by mouth 2 (two) times daily with a meal.      . niacin (SLO-NIACIN) 500 MG tablet Take 500 mg by mouth 2 (two) times daily with a meal.      . pantoprazole (PROTONIX) 40 MG tablet Take 40 mg by mouth daily.      . temazepam (RESTORIL) 30 MG capsule Take 30 mg by mouth at bedtime as needed for sleep.      Marland Kitchen topiramate (TOPAMAX) 100 MG tablet Take 100 mg by mouth 2 (two) times daily.      . ursodiol (ACTIGALL) 300 MG capsule Take 300 mg by mouth 2 (two) times daily.      Marland Kitchen aspirin 81 MG tablet Take 81 mg by mouth daily.       No current facility-administered medications for this visit.   Bupropion hcl and Tetanus toxoid Family History  Problem Relation Age of Onset  . COPD Father    Social History:   reports that she has quit smoking. She has never used smokeless tobacco. She reports that she does not drink alcohol or use illicit drugs.   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 rate 16, height 5\' 3"  (1.6 m), weight 177 lb 3.2 oz (80.377 kg). Body mass index is 31.4 kg/(m^2).  Gen:  WDWN WF NAD  Neurological: Alert and oriented to person, place, and time. Motor and sensory function is grossly intact  Head: Normocephalic and atraumatic.  Eyes: Conjunctivae are normal. Pupils are equal, round, and reactive to light. No scleral icterus.  Neck: Normal range of motion. Neck supple. No tracheal deviation or thyromegaly present.  Breasts:  Post bilateral reduction mammoplasties;  No nipple discharges;  No axillary adenopathy.  No palpable masses in either breast specially in the 8 oclock position of the left breast in the area of the microcalcifications Cardiovascular:  SR without murmurs or gallops.  No carotid bruits Respiratory: Effort normal.  No respiratory distress. No chest wall tenderness. Breath sounds normal.  No wheezes, rales or rhonchi.  Abdomen:  nontender GU: Musculoskeletal: Normal range of motion. Extremities are nontender. No cyanosis, edema or clubbing noted Lymphadenopathy: No cervical, preauricular, postauricular or axillary adenopathy is present Skin: Skin is warm and dry. No rash noted. No  diaphoresis. No erythema. No pallor. Pscyh: Normal mood and affect. Behavior is normal. Judgment and thought content normal.   LABORATORY RESULTS: No results found for this or any previous visit (from the past 48 hour(s)).  RADIOLOGY RESULTS: No results found.  Problem List: Patient Active Problem List   Diagnosis Date Noted  . Diabetes 05/02/2013  . 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  Refer to breast center for core biopsy.      Matt B.  Daphine Deutscher, MD, [facility] [phone] beeper [phone]  05/02/2013 10:02 AM

## 2013-05-02 NOTE — Telephone Encounter (Signed)
I called and spoke with pt to inform her of her appt at the breast center on 05/12/13 with an arrival time of 1:30pm.  I instructed pt to take her prior MM images to them prior to her appt to give the radiologist time to look them over.  Pt agreeable.

## 2013-05-02 NOTE — Patient Instructions (Signed)
Thanks for your patience.  If you need further assistance after leaving the office, please call our office and speak with a [facility] nurse.  [phone].  If you want to leave a message for Dr. Layth Cerezo, please call his office phone at [phone]. 

## 2013-05-12 ENCOUNTER — Other Ambulatory Visit (INDEPENDENT_AMBULATORY_CARE_PROVIDER_SITE_OTHER): Payer: Self-pay | Admitting: Surgery

## 2013-05-12 ENCOUNTER — Ambulatory Visit
Admission: RE | Admit: 2013-05-12 | Discharge: 2013-05-12 | Disposition: A | Discharge status: Home / Self Care | Payer: BC Managed Care – PPO | Source: Ambulatory Visit | Attending: Surgery | Admitting: Surgery

## 2013-05-12 DIAGNOSIS — R921 Mammographic calcification found on diagnostic imaging of breast: Secondary | ICD-10-CM

## 2015-04-27 ENCOUNTER — Encounter: Payer: Self-pay | Admitting: Internal Medicine

## 2016-09-01 ENCOUNTER — Ambulatory Visit (INDEPENDENT_AMBULATORY_CARE_PROVIDER_SITE_OTHER): Payer: BC Managed Care – PPO | Admitting: Pulmonary Disease

## 2016-09-01 ENCOUNTER — Encounter: Payer: Self-pay | Admitting: Pulmonary Disease

## 2016-09-01 DIAGNOSIS — F17210 Nicotine dependence, cigarettes, uncomplicated: Secondary | ICD-10-CM

## 2016-09-01 DIAGNOSIS — R918 Other nonspecific abnormal finding of lung field: Secondary | ICD-10-CM | POA: Diagnosis not present

## 2016-09-01 NOTE — Addendum Note (Signed)
Addended by: Len Blalock on: 09/01/2016 03:04 PM   Modules accepted: Orders

## 2016-09-01 NOTE — Patient Instructions (Signed)
We will arrange for a CT scan of the chest in 6 months to follow the pulmonary nodule If you have any symptoms of shortness of breath, cough, coughing up blood, chest pain, or weight loss that is unexplained please let me know prior.

## 2016-09-01 NOTE — Assessment & Plan Note (Signed)
She smoked one pack of cigarettes daily for 25 years and quit in 2007. Based on this she does not qualify for lung cancer screening.

## 2016-09-01 NOTE — Progress Notes (Signed)
Subjective:    Patient ID: Natasha Franco, female    DOB: [birth year], 66 y.o.   MRN: [id number]  HPI Chief Complaint  Patient presents with  . Advice Only    self referral for second opinion on lung nodule.    Natasha Franco is here to see me for evaluation of a pulmonary nodule.    She had a CT scan performed as a routine screening "heart score" CT performed of her heart.    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  She now lives in [place] and [place].  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  She used to smoke 1 ppd for 25 years.  She quit around 2007.  Never had a Diagnosis of a respiratory problem in the past. She currently denies any sort of chest pain, shortness of breath, weight loss, hemoptysis.  No personal history of caner.      Past Medical History:  Diagnosis Date  . Diabetes mellitus without complication ([place])   . Primary biliary cirrhosis    after taking Lipitor     Family History  Problem Relation Age of Onset  . COPD Father      Social History   Social History  . Marital status: Married    Spouse name: N/A  . Number of children: N/A  . Years of education: N/A   Occupational History  . Not on file.   Social History Main Topics  . Smoking status: Former Smoker    Packs/day: 1.00    Years: 25.00    Types: Cigarettes    Quit date: 09/01/2006  . Smokeless tobacco: Never Used  . Alcohol use No  . Drug use: No  . Sexual activity: Not on file   Other Topics Concern  . Not on file   Social History Narrative  . No narrative on file     Allergies  Allergen Reactions  . Bupropion Hcl     REACTION: Rash  . Statins      Outpatient Medications Prior to Visit  Medication Sig Dispense Refill  . aspirin 81 MG tablet Take 81 mg by mouth daily.    Marland Kitchen escitalopram (LEXAPRO) 10 MG tablet Take 10 mg by mouth daily.    . metFORMIN (GLUCOPHAGE) 500 MG tablet Take 500 mg by mouth 2 (two) times daily  with a meal.    . pantoprazole (PROTONIX) 40 MG tablet Take 40 mg by mouth daily.    Marland Kitchen topiramate (TOPAMAX) 100 MG tablet Take 100 mg by mouth 2 (two) times daily.    . ursodiol (ACTIGALL) 300 MG capsule Take 300 mg by mouth 3 (three) times daily.     Marland Kitchen loratadine (CLARITIN) 10 MG tablet Take 10 mg by mouth daily.    . niacin (SLO-NIACIN) 500 MG tablet Take 500 mg by mouth 2 (two) times daily with a meal.    . temazepam (RESTORIL) 30 MG capsule Take 30 mg by mouth at bedtime as needed for sleep.     No facility-administered medications prior to visit.       Review of Systems  Constitutional: Negative for fever and unexpected weight change.  HENT: Positive for sneezing. Negative for congestion, dental problem, ear pain, nosebleeds, postnasal drip, rhinorrhea, sinus pressure, sore throat and trouble swallowing.   Eyes: Negative for redness and itching.  Respiratory: Positive for shortness of breath. Negative for cough, chest tightness and wheezing.  Cardiovascular: Negative for palpitations and leg swelling.  Gastrointestinal: Negative for nausea and vomiting.  Genitourinary: Negative for dysuria.  Musculoskeletal: Negative for joint swelling.  Skin: Negative for rash.  Neurological: Negative for headaches.  Hematological: Does not bruise/bleed easily.  Psychiatric/Behavioral: Negative for dysphoric mood. The patient is not nervous/anxious.        Objective:   Physical Exam Vitals:   09/01/16 1420  BP: 124/66  Pulse: 76  SpO2: 95%  Weight: 198 lb (89.8 kg)  Height: 5\' 3"  (1.6 m)   RA  Gen: well appearing, no acute distress HENT: NCAT, OP clear, neck supple without masses Eyes: PERRL, EOMi Lymph: no cervical lymphadenopathy PULM: CTA B CV: RRR, no mgr, no JVD GI: BS+, soft, nontender, no hsm Derm: no rash or skin breakdown MSK: normal bulk and tone Neuro: A&Ox4, CN II-XII intact, strength 5/5 in all 4 extremities Psyche: normal mood and affect    08/2016 CT chest >  5x3  mmpulmonary nodule in the RML with small er 2-4 mm subpleural pulmonary nodules, images personally reviewed, agree     Assessment & Plan:  Multiple pulmonary nodules She has multiple small pulmonary nodule seen on the CT scan of her chest performed down in [place] earlier this week. I have personally reviewed the images which shows small round pulmonary nodules, the largest of which is 5 mm in size.  Given her smoking history it is reasonable to repeat his CT scan in 6 months.  We will follow these with serial imaging for a total of 2 years unless there is a change.  Follow-up 6 months  Cigarette smoker She smoked one pack of cigarettes daily for 25 years and quit in 2007. Based on this she does not qualify for lung cancer screening.    Current Outpatient Prescriptions:  .  aspirin 81 MG tablet, Take 81 mg by mouth daily., Disp: , Rfl:  .  Dulaglutide (TRULICITY) [id number] SOPN, Inject 0.75 mg into the skin every 7 (seven) days., Disp: , Rfl:  .  escitalopram (LEXAPRO) 10 MG tablet, Take 10 mg by mouth daily., Disp: , Rfl:  .  eszopiclone (LUNESTA) 2 MG TABS tablet, Take 2 mg by mouth at bedtime as needed for sleep. Take immediately before bedtime, Disp: , Rfl:  .  fluticasone (FLONASE) 50 MCG/ACT nasal spray, Place 2 sprays into both nostrils daily. Uses "aller-flo" generic fluticasone, Disp: , Rfl:  .  metFORMIN (GLUCOPHAGE) 500 MG tablet, Take 500 mg by mouth 2 (two) times daily with a meal., Disp: , Rfl:  .  pantoprazole (PROTONIX) 40 MG tablet, Take 40 mg by mouth daily., Disp: , Rfl:  .  topiramate (TOPAMAX) 100 MG tablet, Take 100 mg by mouth 2 (two) times daily., Disp: , Rfl:  .  ursodiol (ACTIGALL) 300 MG capsule, Take 300 mg by mouth 3 (three) times daily. , Disp: , Rfl:

## 2016-09-01 NOTE — Assessment & Plan Note (Signed)
She has multiple small pulmonary nodule seen on the CT scan of her chest performed down in [place] earlier this week. I have personally reviewed the images which shows small round pulmonary nodules, the largest of which is 5 mm in size.  Given her smoking history it is reasonable to repeat his CT scan in 6 months.  We will follow these with serial imaging for a total of 2 years unless there is a change.  Follow-up 6 months

## 2016-10-02 ENCOUNTER — Institutional Professional Consult (permissible substitution): Payer: BC Managed Care – PPO | Admitting: Pulmonary Disease

## 2017-01-12 ENCOUNTER — Ambulatory Visit
Admission: RE | Admit: 2017-01-12 | Discharge: 2017-01-12 | Disposition: A | Discharge status: Home / Self Care | Payer: Self-pay | Source: Ambulatory Visit | Attending: Pulmonary Disease | Admitting: Pulmonary Disease

## 2017-01-12 ENCOUNTER — Other Ambulatory Visit: Payer: Self-pay | Admitting: Pulmonary Disease

## 2017-01-12 DIAGNOSIS — R918 Other nonspecific abnormal finding of lung field: Secondary | ICD-10-CM

## 2017-03-05 ENCOUNTER — Other Ambulatory Visit: Payer: BC Managed Care – PPO

## 2017-03-06 ENCOUNTER — Ambulatory Visit (INDEPENDENT_AMBULATORY_CARE_PROVIDER_SITE_OTHER)
Admission: RE | Admit: 2017-03-06 | Discharge: 2017-03-06 | Disposition: A | Discharge status: Home / Self Care | Payer: BC Managed Care – PPO | Source: Ambulatory Visit | Attending: Pulmonary Disease | Admitting: Pulmonary Disease

## 2017-03-06 DIAGNOSIS — R918 Other nonspecific abnormal finding of lung field: Secondary | ICD-10-CM

## 2017-03-19 ENCOUNTER — Ambulatory Visit (INDEPENDENT_AMBULATORY_CARE_PROVIDER_SITE_OTHER): Payer: BC Managed Care – PPO | Admitting: Pulmonary Disease

## 2017-03-19 ENCOUNTER — Encounter: Payer: Self-pay | Admitting: Pulmonary Disease

## 2017-03-19 VITALS — BP 90/60 | HR 75 | Ht 63.0 in | Wt 193.6 lb

## 2017-03-19 DIAGNOSIS — R918 Other nonspecific abnormal finding of lung field: Secondary | ICD-10-CM | POA: Diagnosis not present

## 2017-03-19 DIAGNOSIS — K760 Fatty (change of) liver, not elsewhere classified: Secondary | ICD-10-CM

## 2017-03-19 NOTE — Progress Notes (Signed)
Subjective:    Patient ID: Natasha Franco, female    DOB: 08-12-1950, 66 y.o.   MRN: [id number]  Synopsis: Self-referral in February 2018 for pulmonary nodules, she previously smoked one pack of cigarettes daily for 25 years, quit in 2007.  HPI Chief Complaint  Patient presents with  . Follow-up    Pt at CT 03/06/17 prior to visit. Concerned about nodules finding in recent CT scan.   She has been feeling fine.  No recent weight loss or dyspnea.  No cough.  She is here for follow up.    Past Medical History:  Diagnosis Date  . Diabetes mellitus without complication ([place])   . Primary biliary cirrhosis ([facility])    after taking Lipitor     Review of Systems     Objective:   Physical Exam Vitals:   03/19/17 1410  BP: 90/60  Pulse: 75  SpO2: 97%  Weight: 193 lb 9.6 oz (87.8 kg)  Height: 5\' 3"  (1.6 m)    Gen: well appearing HENT: OP clear, TM's clear, neck supple PULM: CTA B, normal percussion CV: RRR, no mgr, trace edema GI: BS+, soft, nontender Derm: no cyanosis or rash Psyche: normal mood and affect   Chest imaging: 08/2016 CT chest > 5x3  mmpulmonary nodule in the RML with small er 2-4 mm subpleural pulmonary nodules, images personally reviewed, agree 02/2017 CT chest > Several scattered pulmonary nodules in both lungs, largest average diameter 5 mm in the right upper lobe, images independently reviewed. No change compared to the February 2018 CT. Images independently reviewed with the patient today in clinic.       Assessment & Plan:   Multiple pulmonary nodules  Fatty liver  Discussion:  This has been a stable interval for Natasha Franco. Her CT scan showed that the nodules have not changed. She has steady liver which we discussed at length today. I recommended lifestyle modification and follow-up with her gastroenterologist.  Plan: For your pulmonary nodule: We will get a CT scan in August 2019  For the fatty liver: I recommend that you talk to your  gastroenterologist about this  We will see you back in August 2019    Current Outpatient Prescriptions:  .  aspirin 81 MG tablet, Take 81 mg by mouth daily., Disp: , Rfl:  .  Azelastine-Fluticasone (DYMISTA) 137-50 MCG/ACT SUSP, Place 1 application into the nose 2 (two) times daily at 10 AM and 5 PM., Disp: , Rfl:  .  Citalopram Hydrobromide (CELEXA PO), Take 1 capsule by mouth daily., Disp: , Rfl:  .  Dulaglutide (TRULICITY) 4.17 EY/8.1KG SOPN, Inject 1.5 mg into the skin every 7 (seven) days. , Disp: , Rfl:  .  empagliflozin (JARDIANCE) 25 MG TABS tablet, Take 25 mg by mouth daily., Disp: , Rfl:  .  escitalopram (LEXAPRO) 10 MG tablet, Take 10 mg by mouth daily., Disp: , Rfl:  .  eszopiclone (LUNESTA) 2 MG TABS tablet, Take 2 mg by mouth at bedtime as needed for sleep. Take immediately before bedtime, Disp: , Rfl:  .  levocetirizine (XYZAL) 5 MG tablet, Take 5 mg by mouth every evening., Disp: , Rfl:  .  metFORMIN (GLUCOPHAGE) 500 MG tablet, Take 500 mg by mouth 2 (two) times daily with a meal., Disp: , Rfl:  .  montelukast (SINGULAIR) 10 MG tablet, Take 10 mg by mouth at bedtime., Disp: , Rfl:  .  pantoprazole (PROTONIX) 40 MG tablet, Take 40 mg by mouth daily., Disp: , Rfl:  .  topiramate (TOPAMAX) 100 MG tablet, Take 100 mg by mouth 2 (two) times daily., Disp: , Rfl:  .  ursodiol (ACTIGALL) 300 MG capsule, Take 300 mg by mouth 3 (three) times daily. , Disp: , Rfl:

## 2017-03-19 NOTE — Patient Instructions (Signed)
For your pulmonary nodule: We will get a CT scan in August 2019  For the fatty liver: I recommend that you talk to your gastroenterologist about this  We will see you back in August 2019

## 2017-03-19 NOTE — Addendum Note (Signed)
Addended by: Len Blalock on: 03/19/2017 02:37 PM   Modules accepted: Orders

## 2017-07-06 ENCOUNTER — Other Ambulatory Visit (HOSPITAL_COMMUNITY): Payer: Self-pay | Admitting: Ophthalmology

## 2017-07-06 ENCOUNTER — Ambulatory Visit (HOSPITAL_COMMUNITY)
Admission: RE | Admit: 2017-07-06 | Discharge: 2017-07-06 | Disposition: A | Discharge status: Home / Self Care | Payer: Medicare Other | Source: Ambulatory Visit | Attending: Ophthalmology | Admitting: Ophthalmology

## 2017-07-06 DIAGNOSIS — I7090 Unspecified atherosclerosis: Secondary | ICD-10-CM

## 2017-07-06 DIAGNOSIS — H349 Unspecified retinal vascular occlusion: Secondary | ICD-10-CM | POA: Diagnosis not present

## 2017-07-06 DIAGNOSIS — I6523 Occlusion and stenosis of bilateral carotid arteries: Secondary | ICD-10-CM | POA: Insufficient documentation

## 2017-07-06 NOTE — Progress Notes (Signed)
Bilateral carotid duplex completed. 1% to 39% ICA stenosis lower end of scale. Vertebral artery flow is antegrade. [facility], [place] 07/06/2017, 1:44 PM

## 2017-08-01 ENCOUNTER — Other Ambulatory Visit: Payer: Self-pay | Admitting: Medical

## 2017-08-01 DIAGNOSIS — Z139 Encounter for screening, unspecified: Secondary | ICD-10-CM

## 2017-08-21 ENCOUNTER — Ambulatory Visit
Admission: RE | Admit: 2017-08-21 | Discharge: 2017-08-21 | Disposition: A | Discharge status: Home / Self Care | Payer: 59 | Source: Ambulatory Visit | Attending: Medical | Admitting: Medical

## 2017-08-21 DIAGNOSIS — Z139 Encounter for screening, unspecified: Secondary | ICD-10-CM

## 2017-10-12 ENCOUNTER — Other Ambulatory Visit: Payer: 59

## 2018-02-12 ENCOUNTER — Ambulatory Visit (INDEPENDENT_AMBULATORY_CARE_PROVIDER_SITE_OTHER)
Admission: RE | Admit: 2018-02-12 | Discharge: 2018-02-12 | Disposition: A | Discharge status: Home / Self Care | Payer: Medicare Other | Source: Ambulatory Visit | Attending: Pulmonary Disease | Admitting: Pulmonary Disease

## 2018-02-12 DIAGNOSIS — R918 Other nonspecific abnormal finding of lung field: Secondary | ICD-10-CM | POA: Diagnosis not present

## 2018-02-27 ENCOUNTER — Ambulatory Visit: Payer: 59 | Admitting: Pulmonary Disease

## 2018-07-25 ENCOUNTER — Encounter: Payer: Self-pay | Admitting: Interventional Cardiology

## 2018-08-01 ENCOUNTER — Encounter: Payer: Self-pay | Admitting: Interventional Cardiology

## 2018-08-12 ENCOUNTER — Encounter: Payer: Self-pay | Admitting: Interventional Cardiology

## 2018-08-12 ENCOUNTER — Ambulatory Visit: Payer: Medicare Other | Admitting: Interventional Cardiology

## 2018-08-12 ENCOUNTER — Encounter: Payer: Self-pay | Admitting: Nurse Practitioner

## 2018-08-12 ENCOUNTER — Encounter (INDEPENDENT_AMBULATORY_CARE_PROVIDER_SITE_OTHER): Payer: Self-pay

## 2018-08-12 VITALS — BP 124/68 | HR 89 | Ht 63.0 in | Wt 190.8 lb

## 2018-08-12 DIAGNOSIS — E0822 Diabetes mellitus due to underlying condition with diabetic chronic kidney disease: Secondary | ICD-10-CM | POA: Diagnosis not present

## 2018-08-12 DIAGNOSIS — N181 Chronic kidney disease, stage 1: Secondary | ICD-10-CM

## 2018-08-12 DIAGNOSIS — E782 Mixed hyperlipidemia: Secondary | ICD-10-CM

## 2018-08-12 DIAGNOSIS — F17210 Nicotine dependence, cigarettes, uncomplicated: Secondary | ICD-10-CM

## 2018-08-12 DIAGNOSIS — R079 Chest pain, unspecified: Secondary | ICD-10-CM | POA: Diagnosis not present

## 2018-08-12 DIAGNOSIS — K743 Primary biliary cirrhosis: Secondary | ICD-10-CM

## 2018-08-12 NOTE — Progress Notes (Addendum)
Cardiology Office Note:    Date:  08/12/2018   ID:  Natasha Franco, DOB 12/05/1950, MRN [id number]  PCP:  Benard Halsted, PA-C  Cardiologist:  Sinclair Grooms, MD   Referring MD: No ref. provider found   Chief Complaint  Patient presents with  . Chest Pain    History of Present Illness:    Natasha Franco is a 68 y.o. female with a hx of diabetes mellitus, prior smoking history, hypertension, hyperlipidemia, who is self-referred because of recurring episodes of chest discomfort that awaken her from sleep.  The patient has had 3 episodes of chest pressure associated with bilateral jaw discomfort that awaken her from sleep.  This is happened on 3 separate occasions without any particular other associations.  Each episode is lasted less than 15 minutes.  There is no nausea, vomiting, diaphoresis, or dyspnea.  She has never had similar discomfort with physical activity.  She is not having racing heart/tachycardia.  She does have a history of reflux.  The current complaint is dissimilar to to prior reflux complaints  Past Medical History:  Diagnosis Date  . Arthritis   . Diabetes mellitus without complication ([place])   . GERD (gastroesophageal reflux disease)   . Migraines   . 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 ABDOMINAL HYSTERECTOMY    . BREAST BIOPSY Left 2014  . BREAST SURGERY Bilateral 1984   reduction mammoplasty  . CARPAL TUNNEL RELEASE    . CESAREAN SECTION    . JOINT REPLACEMENT    . REDUCTION MAMMAPLASTY Bilateral   . ROTATOR CUFF REPAIR    . TONSILLECTOMY      Current Medications: Current Meds  Medication Sig  . aspirin 81 MG tablet Take 81 mg by mouth daily.  . Azelastine-Fluticasone (DYMISTA) 137-50 MCG/ACT SUSP Place 1 application into the nose 2 (two) times daily at 10 AM and 5 PM.  . Dulaglutide (TRULICITY) 3.82 NK/5.3ZJ SOPN Inject 1.5 mg into the skin every 7 (seven) days.   .  empagliflozin (JARDIANCE) 25 MG TABS tablet Take 25 mg by mouth daily.  Marland Kitchen escitalopram (LEXAPRO) 10 MG tablet Take 10 mg by mouth daily.  Marland Kitchen levocetirizine (XYZAL) 5 MG tablet Take 5 mg by mouth every evening.  . metFORMIN (GLUCOPHAGE) 500 MG tablet Take 500 mg by mouth. 1 in the AM; 2 in the PM  . montelukast (SINGULAIR) 10 MG tablet Take 10 mg by mouth at bedtime.  . pantoprazole (PROTONIX) 40 MG tablet Take 40 mg by mouth daily.  Marland Kitchen topiramate (TOPAMAX) 100 MG tablet Take 100 mg by mouth 2 (two) times daily.  . ursodiol (ACTIGALL) 300 MG capsule Take 300 mg by mouth 3 (three) times daily.      Allergies:   Bupropion hcl; Invokana [canagliflozin]; Statins; and Zetia [ezetimibe]   Social History   Socioeconomic History  . Marital status: Married    Spouse name: Not on file  . Number of children: 2  . Years of education: Not on file  . Highest education level: Not on file  Occupational History  . Occupation: DOSHER  Social Needs  . Financial resource strain: Not on file  . Food insecurity:    Worry: Not on file    Inability: Not on file  . Transportation needs:    Medical: Not on file    Non-medical: Not on file  Tobacco Use  . Smoking  status: Former Smoker    Packs/day: 1.00    Years: 25.00    Pack years: 25.00    Types: Cigarettes    Last attempt to quit: 09/01/2006    Years since quitting: 11.9  . Smokeless tobacco: Never Used  Substance and Sexual Activity  . Alcohol use: No  . Drug use: No  . Sexual activity: Not on file  Lifestyle  . Physical activity:    Days per week: Not on file    Minutes per session: Not on file  . Stress: Not on file  Relationships  . Social connections:    Talks on phone: Not on file    Gets together: Not on file    Attends religious service: Not on file    Active member of club or organization: Not on file    Attends meetings of clubs or organizations: Not on file    Relationship status: Not on file  Other Topics Concern  . Not on  file  Social History Narrative  . Not on file     Family History: The patient's family history includes COPD in her father; Diabetes Mellitus II in her father.  ROS:   Please see the history of present illness.    Occasional abdominal pain, snoring, difficulty sleeping, history of primary biliary cirrhosis.  Prior history of significant elevation in liver enzymes on statin therapy.  Prior cardiac evaluation in 2019 in [facility] where calcium score was 0.  All other systems reviewed and are negative.  EKGs/Labs/Other Studies Reviewed:    The following studies were reviewed today:  Coronary calcium score 0 February 2018  LDL cholesterol 143 August 2019  Total cholesterol 205 and triglyceride 127 August 2019  Aortic atherosclerosis by CT scan performed 2019.  EKG:  EKG normal sinus rhythm, small inferior Q waves,  Recent Labs: No results found for requested labs within last 8760 hours.  Recent Lipid Panel No results found for: CHOL, TRIG, HDL, CHOLHDL, VLDL, LDLCALC, LDLDIRECT  Physical Exam:    VS:  BP 124/68   Pulse 89   Ht 5\' 3"  (1.6 m)   Wt 190 lb 12.8 oz (86.5 kg)   SpO2 96%   BMI 33.80 kg/m     Wt Readings from Last 3 Encounters:  08/12/18 190 lb 12.8 oz (86.5 kg)  03/19/17 193 lb 9.6 oz (87.8 kg)  09/01/16 198 lb (89.8 kg)     GEN: Obese. No acute distress HEENT: Normal NECK: No JVD. LYMPHATICS: No lymphadenopathy CARDIAC: RRR.  No murmur, no gallop, no edema VASCULAR: 2+ carotid and radial bilateral pulses, no bruits RESPIRATORY:  Clear to auscultation without rales, wheezing or rhonchi  ABDOMEN: Soft, non-tender, non-distended, No pulsatile mass, MUSCULOSKELETAL: No deformity  SKIN: Warm and dry NEUROLOGIC:  Alert and oriented x 3 PSYCHIATRIC:  Normal affect   ASSESSMENT:    1. Chest pain, unspecified type   2. Mixed hyperlipidemia   3. 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 Primary biliary cirrhosis ([place])   5. Cigarette smoker    PLAN:    In order of problems listed above:  1. Chest pressure with bilateral radiation to the neck awakening the patient from sleep, occurring on 3 separate occasions, each episode lasting less than 15 minutes, and with no exertional component.  Stress Myoview to rule out evidence of ischemia.  0 coronary calcium score noted in September  2018.  Sublingual nitroglycerin should be used if discomfort recurs.  Report affect. 2. Lipids are markedly elevated given risk profile (diabetes mellitus type 2) and needs therapy.  She had significant rise in hepatic enzymes with initiation of atorvastatin in the past and is negative about ever being placed on statin therapy again.  She will discuss lipid management agents with her gastroenterologist.  I have recommended that she ezetimibe. 3. Hemoglobin A1c target should be less than 7.  She is currently on strong cardioprotective therapy in the form of empagliflozin and dulaglutide. 4. This problem is being managed by a gastroenterologist.  We need input from gastroenterology before therapy other than as ezetimibe is started. 5. She continues to abstain from cigarette smoking.  Stress Myoview will be performed to rule out evidence of structural disease.  Contemplated coronary CT angiogram but decided against.  Further management will be based upon the exercise tolerance and perfusion imaging.  Needs statin or PCSK9 therapy to help lower lipids further.  PRN follow-up based on findings.   Medication Adjustments/Labs and Tests Ordered: Current medicines are reviewed at length with the patient today.  Concerns regarding medicines are outlined above.  Orders Placed This Encounter  Procedures  . Myocardial Perfusion Imaging  . EKG 12-Lead   No orders of the defined types were placed in this encounter.   Patient Instructions  Medication Instructions:    Your physician recommends that you continue on your current medications as directed. Please refer to the Current Medication list given to you today.  If you need a refill on your cardiac medications before your next appointment, please call your pharmacy.   Lab work: None Ordered    Testing/Procedures: Your physician has requested that you have an exercise stress myoview. For further information please visit [email or web address]. Please follow instruction sheet, as given.    Follow-Up: At [facility], you and your health needs are our priority.  As part of our continuing mission to provide you with exceptional heart care, we have created designated Provider Care Teams.  These Care Teams include your primary Cardiologist (physician) and Advanced Practice Providers (APPs -  Physician Assistants and Nurse Practitioners) who all work together to provide you with the care you need, when you need it. You will need a follow up appointment in  TBD.  You may see Sinclair Grooms, MD or one of the following Advanced Practice Providers on your designated Care Team:   Truitt Merle, NP Cecilie Kicks, NP . Kathyrn Drown, NP       Signed, Sinclair Grooms, MD  08/12/2018 5:59 PM    [place]

## 2018-08-12 NOTE — Patient Instructions (Signed)
Medication Instructions:  Your physician recommends that you continue on your current medications as directed. Please refer to the Current Medication list given to you today.  If you need a refill on your cardiac medications before your next appointment, please call your pharmacy.   Lab work: None Ordered    Testing/Procedures: Your physician has requested that you have an exercise stress myoview. For further information please visit [email or web address]. Please follow instruction sheet, as given.    Follow-Up: At [facility], you and your health needs are our priority.  As part of our continuing mission to provide you with exceptional heart care, we have created designated Provider Care Teams.  These Care Teams include your primary Cardiologist (physician) and Advanced Practice Providers (APPs -  Physician Assistants and Nurse Practitioners) who all work together to provide you with the care you need, when you need it. You will need a follow up appointment in  TBD.  You may see Sinclair Grooms, MD or one of the following Advanced Practice Providers on your designated Care Team:   Truitt Merle, NP Cecilie Kicks, NP . Kathyrn Drown, NP

## 2018-08-19 ENCOUNTER — Telehealth (HOSPITAL_COMMUNITY): Payer: Self-pay | Admitting: *Deleted

## 2018-08-19 NOTE — Telephone Encounter (Signed)
Patient given detailed instructions per Myocardial Perfusion Study Information Sheet for the test on 08/21/18. Patient notified to arrive 15 minutes early and that it is imperative to arrive on time for appointment to keep from having the test rescheduled.  If you need to cancel or reschedule your appointment, please call the office within 24 hours of your appointment. . Patient verbalized understanding. Kirstie Peri

## 2018-08-21 ENCOUNTER — Ambulatory Visit (HOSPITAL_COMMUNITY): Payer: Medicare Other | Attending: Internal Medicine

## 2018-08-21 DIAGNOSIS — F17210 Nicotine dependence, cigarettes, uncomplicated: Secondary | ICD-10-CM | POA: Insufficient documentation

## 2018-08-21 DIAGNOSIS — N181 Chronic kidney disease, stage 1: Secondary | ICD-10-CM | POA: Diagnosis not present

## 2018-08-21 DIAGNOSIS — E0822 Diabetes mellitus due to underlying condition with diabetic chronic kidney disease: Secondary | ICD-10-CM | POA: Diagnosis not present

## 2018-08-21 DIAGNOSIS — R079 Chest pain, unspecified: Secondary | ICD-10-CM | POA: Diagnosis not present

## 2018-08-21 LAB — MYOCARDIAL PERFUSION IMAGING
Estimated workload: 7 METS
Exercise duration (min): 6 min
LV sys vol: 16 mL
LVDIAVOL: 45 mL (ref 46–106)
MPHR: 152 {beats}/min
Peak HR: 157 {beats}/min
Percent HR: 101 %
RPE: 18
Rest HR: 76 {beats}/min
SDS: 3
SRS: 1
SSS: 4
TID: 0.76

## 2018-08-21 MED ORDER — TECHNETIUM TC 99M TETROFOSMIN IV KIT
32.3000 | PACK | Freq: Once | INTRAVENOUS | Status: AC | PRN
  Administered 2018-08-21: 32.3 via INTRAVENOUS
  Filled 2018-08-21: qty 33

## 2018-08-21 MED ORDER — TECHNETIUM TC 99M TETROFOSMIN IV KIT
10.4000 | PACK | Freq: Once | INTRAVENOUS | Status: AC | PRN
  Administered 2018-08-21: 10.4 via INTRAVENOUS
  Filled 2018-08-21: qty 11

## 2018-08-22 ENCOUNTER — Telehealth: Payer: Self-pay | Admitting: *Deleted

## 2018-08-22 DIAGNOSIS — E782 Mixed hyperlipidemia: Secondary | ICD-10-CM

## 2018-08-22 NOTE — Telephone Encounter (Signed)
Spoke with pt and went over results.  Pt in agreement with seeing the Lipid Clinic.  Scheduled pt to come in 09/16/2018.  Pt appreciative for call.

## 2018-08-22 NOTE — Telephone Encounter (Signed)
-----   Message from Belva Crome, MD sent at 08/22/2018  1:19 PM EST ----- Let the patient know that the stress nuclear study was low risk.  Remind her that the CT scan that she had done approximately 1 year ago had aortic atherosclerosis.  Given her risk factor profile, I would suggest that she be seen in our lipid clinic to consider alternative treatment strategies for LDL lowering than statin agents.  Otherwise no further work-up at this time.  Attempt to achieve 150 minutes of moderate activity per week.  Notify me if any recurrent episodes of chest discomfort. A copy will be sent to Gold[place]uss, Sharlette Dense, PA-C

## 2018-09-16 ENCOUNTER — Ambulatory Visit (INDEPENDENT_AMBULATORY_CARE_PROVIDER_SITE_OTHER): Payer: Medicare Other | Admitting: Pharmacist

## 2018-09-16 DIAGNOSIS — E782 Mixed hyperlipidemia: Secondary | ICD-10-CM | POA: Diagnosis not present

## 2018-09-16 NOTE — Progress Notes (Signed)
Patient ID: Natasha Franco                 DOB: [birth year]                    MRN: [id number]     HPI: Natasha Franco is a 68 y.o. female patient referred to lipid clinic by Dr. Tamala Julian. PMH is significant for diabetes mellitus, prior smoking history, hypertension, and hyperlipidemia. She had a bilateral carotid duplex performed (06/2017), 1-39% ICA, with 0 calcium score noted in September 2018 (per Dr. Thompson Caul note 2/3), and a low-risk stress test 08/2018.  She presents today in good spirits. She has a history of intolerance to atorvastatin (diagnosed with biliary cirrhosis, reports LFTs were 10x ULN), and ezetimibe (cough). 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 She reports a relatively health diet, admits to eating eggs/steak once a week. She stays active by walking 30 minutes most days of the week.   Current Medications: none Intolerances: atorvastatin (a couple years ago; elevations in LFTs, 10x UNL/primary biliary cirrhosis), ezetimibe (cough) Risk Factors: DM, 1-39% ICA LDL goal: < 100   Diet: She has been using Noom (1500 calories/day), is doing a 16 hours fast for [employer] (skipping breakfast), lunch is leftovers (jasmine rice, stew;cottage cheese and fruits; salad (cesar or balsalmic vinagerette; chicken (baked), fish/shrimp/salmon/grouper), loves all vegetables (steamed or baked); eats out a couple times a week- chicken or steak (once a week), broccoli or salad  [facility] [employer], [place]); eggs once a week  Exercise: walks 30 minutes 5x/week (restricted by left knee, surgery a few years ago)  Family History: The patient's family history includes COPD in her father; Diabetes Mellitus II in her father.  Social History: (-) tobacco, former smoker (quit 2008), (-) alcohol   Labs: 08/26/18: Tot 192, HDL 34, LDL 130, TG 140; ALT 52, AST 32, Alk Phos 106 02/13/18: Tot 206, HDL 38, LDL 143, TG 127; ALT 44, AST 35, Alk Phos  109 08/22/17: AST 25, ALT 44, Alk Phos 112  Past Medical History:  Diagnosis Date  . Arthritis   . Diabetes mellitus without complication ([place])   . GERD (gastroesophageal reflux disease)   . Migraines   . Primary biliary cirrhosis (HCC)    after taking Lipitor    Current Outpatient Medications on File Prior to Visit  Medication Sig Dispense Refill  . aspirin 81 MG tablet Take 81 mg by mouth daily.    . Azelastine-Fluticasone (DYMISTA) 137-50 MCG/ACT SUSP Place 1 application into the nose 2 (two) times daily at 10 AM and 5 PM.    . Dulaglutide (TRULICITY) 2.99 BZ/1.6RC SOPN Inject 1.5 mg into the skin every 7 (seven) days.     . empagliflozin (JARDIANCE) 25 MG TABS tablet Take 25 mg by mouth daily.    Marland Kitchen escitalopram (LEXAPRO) 10 MG tablet Take 10 mg by mouth daily.    Marland Kitchen levocetirizine (XYZAL) 5 MG tablet Take 5 mg by mouth every evening.    . metFORMIN (GLUCOPHAGE) 500 MG tablet Take 500 mg by mouth. 1 in the AM; 2 in the PM    . montelukast (SINGULAIR) 10 MG tablet Take 10 mg by mouth at bedtime.    . pantoprazole (PROTONIX) 40 MG tablet Take 40 mg by mouth daily.    Marland Kitchen topiramate (TOPAMAX) 100 MG tablet Take 100 mg by mouth 2 (two) times daily.    . ursodiol (ACTIGALL) 300  MG capsule Take 300 mg by mouth 3 (three) times daily.      No current facility-administered medications on file prior to visit.     Allergies  Allergen Reactions  . Bupropion Hcl     REACTION: Rash  . Invokana [Canagliflozin]     VAGINAL YEAST INFECTIONS  . Statins   . Zetia [Ezetimibe] Cough    Assessment/Plan:  1. Hyperlipidemia - Patient's LDL is above goal of < 100 for primary prevention (DM). She has a history of intolerance to atorvastatin (diagnosed with biliary cirrhosis, reports LFTs were 10x ULN), will request records from primary care office (in case needed in future). She also reports intolerance to ezetimibe, reporting cough that developed after she started and resolved after stopping. We  discussed potential future therapies, including reduced dose ezetimibe, lovastatin (would need to discuss with gastroenterologist) or bempedoic acid (once available). She does not wish to retrial a statin. She wishes to continue with lifestyle modifications. Encouraged her to decrease saturated fat/red meat/cheese intake eat more meals at home, and increase her physical activity. Encouraged her to start aquatic exercises at the [facility].  Will check lipid panel in 3 months and see in clinic at that time.   Claiborne Billings, PharmD PGY2 Cardiology Pharmacy Resident 09/16/2018 10:32 PM

## 2018-09-16 NOTE — Patient Instructions (Addendum)
It was nice to meet you today.  Continue working on your diet (decreasing saturated fats, cheese, red meats) and try to increase you activity as your knee allows. You could try water aerobics at the [facility] to take some pressure off your knee.  We will check your lipids in 3 months and see you the next day.

## 2018-09-26 ENCOUNTER — Other Ambulatory Visit: Payer: Self-pay | Admitting: Medical

## 2018-09-26 DIAGNOSIS — Z1231 Encounter for screening mammogram for malignant neoplasm of breast: Secondary | ICD-10-CM

## 2018-11-04 ENCOUNTER — Ambulatory Visit: Payer: Medicare Other

## 2018-11-26 ENCOUNTER — Telehealth (INDEPENDENT_AMBULATORY_CARE_PROVIDER_SITE_OTHER): Payer: Medicare Other | Admitting: Pharmacist

## 2018-11-26 DIAGNOSIS — E782 Mixed hyperlipidemia: Secondary | ICD-10-CM

## 2018-11-26 NOTE — Progress Notes (Signed)
Patient ID: Natasha Franco                 DOB: Sep 06, 1950                    MRN: [id number]     HPI: Natasha Franco is a 68 y.o. female patient referred to lipid clinic by Dr. Tamala Julian. PMH is significant for diabetes mellitus, prior smoking history, hypertension, and hyperlipidemia. She had a bilateral carotid duplex performed (06/2017), 1-39% ICA, with 0 calcium score noted in September 2018 (per Dr. Thompson Caul note 2/3), and a low-risk stress test 08/2018.  She has a history of intolerance to atorvastatin (diagnosed with biliary cirrhosis, reports LFTs were 10x ULN), and ezetimibe (cough). 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   Spoke with patient on 11/26/2018 via telehealth visit due to limiting in office visits because of the Covid-19 pandemic. Patient reports maintaining a relatively healthy diet. Patient also states she would like to continue making diet modifications to bring her LDL down to goal and is very hesitant to starting new therapies after being diagnosed with biliary cirrhosis.   Current Medications: none Intolerances: atorvastatin (a couple years ago; elevations in LFTs, 10x UNL/primary biliary cirrhosis), ezetimibe (cough) Risk Factors: DM, 1-39% ICA LDL goal: < 100   Diet: Reports she has been eating a much healthier diet and limiting red meat to 2x per month as well as limiting egg intake to once a month. Patient reports rarely eating breakfast. For lunch, she states she normally eats either [place], chicken or tuna with a side of vegetables. Dinner normally consists of chicken, fish, or pork with a side of vegetables.    Exercise: walks 30 minutes 5x/week (restricted by left knee, surgery a few years ago)  Family History: The patient's family history includes COPD in her father; Diabetes Mellitus II in her father.  Social History: (-) tobacco, former smoker (quit 2008), (-) alcohol   Labs: 08/26/18:  Tot 192, HDL 34, LDL 130, TG 140; ALT 52, AST 32, Alk Phos 106 02/13/18: Tot 206, HDL 38, LDL 143, TG 127; ALT 44, AST 35, Alk Phos 109 08/22/17: AST 25, ALT 44, Alk Phos 112  Past Medical History:  Diagnosis Date   Arthritis    Diabetes mellitus without complication (HCC)    GERD (gastroesophageal reflux disease)    Migraines    Primary biliary cirrhosis ([place])    after taking Lipitor    Current Outpatient Medications on File Prior to Visit  Medication Sig Dispense Refill   aspirin 81 MG tablet Take 81 mg by mouth daily.     Azelastine-Fluticasone (DYMISTA) 137-50 MCG/ACT SUSP Place 1 application into the nose 2 (two) times daily at 10 AM and 5 PM.     Dulaglutide (TRULICITY) 7.65 YY/5.0PT SOPN Inject 1.5 mg into the skin every 7 (seven) days.      empagliflozin (JARDIANCE) 25 MG TABS tablet Take 25 mg by mouth daily.     escitalopram (LEXAPRO) 10 MG tablet Take 10 mg by mouth daily.     levocetirizine (XYZAL) 5 MG tablet Take 5 mg by mouth every evening.     metFORMIN (GLUCOPHAGE) 500 MG tablet Take 500 mg by mouth. 1 in the AM; 2 in the PM     montelukast (SINGULAIR) 10 MG tablet Take 10 mg by mouth at bedtime.     pantoprazole (PROTONIX) 40 MG tablet Take 40  mg by mouth daily.     topiramate (TOPAMAX) 100 MG tablet Take 100 mg by mouth 2 (two) times daily.     ursodiol (ACTIGALL) 300 MG capsule Take 300 mg by mouth 3 (three) times daily.      No current facility-administered medications on file prior to visit.     Allergies  Allergen Reactions   Bupropion Hcl     REACTION: Rash   Invokana [Canagliflozin]     VAGINAL YEAST INFECTIONS   Statins    Zetia [Ezetimibe] Cough    Assessment/Plan:  1. Hyperlipidemia - Patient's LDL is above goal of < 100 for primary prevention (DM). She has a history of intolerance to atorvastatin (diagnosed with biliary cirrhosis, reports LFTs were 10x ULN). We discussed potential therapies, including statin therapy or bempedoic  acid. Patient is very resistant to starting any medications at this time for cholesterol management. Patient also refuses statin retrial. She wishes to continue with lifestyle modifications at this time and seems to be more open to considering therapy after next lipid panel. Educated patient that dietary changes alone may not be enough to bring her LDL down to goal, however, encouraged patient to continue positive diet changes. Will follow up with lipid panel on June 8th.   Gwenlyn Found, Sherian Rein D PGY1 Pharmacy Resident  Phone [phone] 11/26/2018   10:25 AM

## 2018-12-13 ENCOUNTER — Telehealth: Payer: Self-pay | Admitting: *Deleted

## 2018-12-13 NOTE — Telephone Encounter (Signed)
    COVID-19 Pre-Screening Questions:  . In the past 7 to 10 days have you had a cough,  shortness of breath, headache, congestion, fever (100 or greater) body aches, chills, sore throat, or sudden loss of taste or sense of smell? . Have you been around anyone with known Covid 19. . Have you been around anyone who is awaiting Covid 19 test results in the past 7 to 10 days? . Have you been around anyone who has been exposed to Covid 19, or has mentioned symptoms of Covid 19 within the past 7 to 10 days?  If you have any concerns/questions about symptoms patients report during screening (either on the phone or at threshold). Contact the provider seeing the patient or DOD for further guidance.  If neither are available contact a member of the leadership team.           Contacted patient via telephone call. No to call Covid 19 questions . Understands the importantce of a mask. KB  

## 2018-12-16 ENCOUNTER — Encounter (INDEPENDENT_AMBULATORY_CARE_PROVIDER_SITE_OTHER): Payer: Self-pay

## 2018-12-16 ENCOUNTER — Other Ambulatory Visit: Payer: Self-pay

## 2018-12-16 ENCOUNTER — Other Ambulatory Visit: Payer: Medicare Other | Admitting: *Deleted

## 2018-12-16 DIAGNOSIS — E782 Mixed hyperlipidemia: Secondary | ICD-10-CM

## 2018-12-16 LAB — LIPID PANEL
Chol/HDL Ratio: 5.3 ratio — ABNORMAL HIGH (ref 0.0–4.4)
Cholesterol, Total: 191 mg/dL (ref 100–199)
HDL: 36 mg/dL — ABNORMAL LOW (ref >39)
LDL Calculated: 130 mg/dL — ABNORMAL HIGH (ref 0–99)
Triglycerides: 123 mg/dL (ref 0–149)
VLDL Cholesterol Cal: 25 mg/dL (ref 5–40)

## 2018-12-16 LAB — HEPATIC FUNCTION PANEL
ALT: 31 IU/L (ref 0–32)
AST: 19 IU/L (ref 0–40)
Albumin: 4.2 g/dL (ref 3.8–4.8)
Alkaline Phosphatase: 102 IU/L (ref 39–117)
Bilirubin Total: 0.3 mg/dL (ref 0.0–1.2)
Bilirubin, Direct: 0.11 mg/dL (ref 0.00–0.40)
Total Protein: 7.1 g/dL (ref 6.0–8.5)

## 2018-12-17 ENCOUNTER — Telehealth: Payer: Self-pay | Admitting: Pharmacist

## 2018-12-17 ENCOUNTER — Ambulatory Visit: Payer: Medicare Other

## 2018-12-17 MED ORDER — EVOLOCUMAB 140 MG/ML ~~LOC~~ SOAJ: 1.0000 "pen " | SUBCUTANEOUS | 11 refills | Status: DC

## 2018-12-17 NOTE — Telephone Encounter (Signed)
Called pt to discuss lab results - LDL remains unchanged at 130 despite dietary improvements. Pt had LFT increase > 10x ULN on Lipitor and is intolerant to Zetia. Discussed PCSK9i with patient and she is agreeable to trying this if the copay is affordable. Will submit prior authorization and follow up with pt once copay information is known.

## 2018-12-17 NOTE — Telephone Encounter (Signed)
Prior authorization for Repatha has been approved through 06/18/19. Rx sent to pharmacy, copay $40/month. Pt is ok with this copay and will call once she starts on injections so that we can schedule follow up lab work.

## 2018-12-18 ENCOUNTER — Other Ambulatory Visit: Payer: Self-pay

## 2018-12-18 ENCOUNTER — Ambulatory Visit
Admission: RE | Admit: 2018-12-18 | Discharge: 2018-12-18 | Disposition: A | Discharge status: Home / Self Care | Payer: Medicare Other | Source: Ambulatory Visit | Attending: Medical | Admitting: Medical

## 2018-12-18 DIAGNOSIS — Z1231 Encounter for screening mammogram for malignant neoplasm of breast: Secondary | ICD-10-CM

## 2019-01-28 ENCOUNTER — Telehealth: Payer: Self-pay | Admitting: *Deleted

## 2019-01-28 NOTE — Telephone Encounter (Signed)
Dr. Tamala Julian   Could you comment on this patients ASA therapy? You last saw her 08/2018 for atypical chest pain. She has a hx of diabetes mellitus, prior smoking history, hypertension, hyperlipidemia who self referred to you due to recurring episodes of chest discomfort. She underwent a stress test 08/21/2018 that was low risk. She was recently started on Repatha per Lipid clinic. She seems very low risk, however given her coronary risk factors, I preferred to run it by you.   She is in need of a left knee arthroplasty.    Thank you  Sharee Pimple

## 2019-01-28 NOTE — Telephone Encounter (Signed)
   [facility] Pre-operative Risk Assessment    Request for surgical clearance:  1. What type of surgery is being performed? LEFT KNEE ARTHROPLASTY   2. When is this surgery scheduled? TBD  3. What type of clearance is required (medical clearance vs. Pharmacy clearance to hold med vs. Both)? MEDICAL  4. Are there any medications that need to be held prior to surgery and how long? ASA   5. Practice name and name of physician performing surgery? [facility]; DR. Collier Salina DALLDORF  6. What is your office phone number [phone]   7.   What is your office fax number [phone]  8.   Anesthesia type (None, local, MAC, general) ? SPINAL   Julaine Hua 01/28/2019, 2:48 PM  _________________________________________________________________   (provider comments below)

## 2019-01-29 NOTE — Telephone Encounter (Signed)
   Primary Cardiologist: Sinclair Grooms, MD  Chart reviewed as part of pre-operative protocol coverage. Patient was contacted 01/29/2019 in reference to pre-operative risk assessment for pending surgery as outlined below.  FLORENCE YEUNG was last seen on 08/12/2018 by Dr. Tamala Julian.  Since that day, LUCKY ALVERSON has done well from a cardiac perspective. She denies recurrent chest pain since that time.  Per Dr. Tamala Julian, okay to hold ASA as needed for procedure and resume when cleared by surgical team.  Therefore, based on ACC/AHA guidelines, the patient would be at acceptable risk for the planned procedure without further cardiovascular testing.   I will route this recommendation to the requesting party via Epic fax function and remove from pre-op pool.  Please call with questions.  Kathyrn Drown, NP 01/29/2019, 12:04 PM

## 2019-01-29 NOTE — Telephone Encounter (Signed)
Okay to hold ASA and proceed with surgery.

## 2019-02-04 ENCOUNTER — Telehealth: Payer: Self-pay | Admitting: Pulmonary Disease

## 2019-02-04 NOTE — Telephone Encounter (Signed)
Received surgical clearance request from [place] procedure is Left Hip Arthroplasty with Spinal Anesthesia  Patient has not been seen in the office since 2018 with BQ  Last CT was 02/2018 and result note states that further imaging is not needed  Called spoke with patient to discuss who reported that Dr Lake Bells 'released her' and follow up is not needed.  Patient is asking if Dr. Lake Bells can "just authorize the surgery" without seeing her again.  Explained to patient that BQ is not in the office and is working in the hospital but she is adamant about asking this of BQ.    Dr. Lake Bells please advise, thank you.   **NOTE: when speaking with patient, she also stated that it is NOT hip surgery but is in fact KNEE surgery.  Have LMOM TCB x1 for Marlin Canary Surgery Coordinator to inquire about clarification.

## 2019-02-05 NOTE — Telephone Encounter (Signed)
She never followed up in 2019 like we had planned  Needs OV

## 2019-02-06 NOTE — Telephone Encounter (Signed)
Spoke with pt  She is aware of BQ's response  She states she spoke with ortho yesterday and pulm clearance is not even needed  Will call if needed

## 2019-02-24 ENCOUNTER — Other Ambulatory Visit: Payer: Self-pay | Admitting: Orthopaedic Surgery

## 2019-03-18 NOTE — Patient Instructions (Addendum)
DUE TO COVID-19 ONLY ONE VISITOR IS ALLOWED TO COME WITH YOU AND STAY IN THE WAITING ROOM ONLY DURING PRE OP AND PROCEDURE DAY OF SURGERY.  THE 1 VISITOR MAY VISIT WITH YOU AFTER SURGERY IN YOUR PRIVATE ROOM DURING VISITING HOURS ONLY!  YOU NEED TO HAVE A COVID 19 TEST ON__Friday 9/11_____ @_11 :00______, THIS TEST MUST BE DONE BEFORE SURGERY, COME  [address] [place] , [address].  ([place])  ONCE YOUR COVID TEST IS COMPLETED, PLEASE BEGIN THE QUARANTINE INSTRUCTIONS AS OUTLINED IN YOUR HANDOUT.                Natasha Franco   Your procedure is scheduled on: Tuesday 03/25/19   Report to [facility] Main  Entrance   Report to Short Stay at   5:30 AM     Call this number if you have problems the morning of surgery [place], NO CHEWING GUM [place].    Do not eat food After Midnight.   YOU MAY HAVE CLEAR LIQUIDS FROM MIDNIGHT UNTIL 4:30AM.  At 4:30AM Please finish the prescribed Pre-Surgery Gatorade drink.  Nothing by mouth after you finish the Gatorade drink !    Take these medicines the morning of surgery with A SIP OF WATER:  Protonix  How to Manage Your Diabetes Before and After Surgery  Why is it important to control my blood sugar before and after surgery? . Improving blood sugar levels before and after surgery helps healing and can limit problems. . A way of improving blood sugar control is eating a healthy diet by: o  Eating less sugar and carbohydrates o  Increasing activity/exercise o  Talking with your doctor about reaching your blood sugar goals . High blood sugars (greater than 180 mg/dL) can raise your risk of infections and slow your recovery, so you will need to focus on controlling your diabetes during the weeks before surgery. . Make sure that the doctor who takes care of your diabetes knows about your planned surgery including the date and  location.  How do I manage my blood sugar before surgery? . Check your blood sugar at least 4 times a day, starting 2 days before surgery, to make sure that the level is not too high or low. o Check your blood sugar the morning of your surgery when you wake up and every 2 hours until you get to the Short Stay unit. . If your blood sugar is less than 70 mg/dL, you will need to treat for low blood sugar: o Do not take insulin. o Treat a low blood sugar (less than 70 mg/dL) with  cup of clear juice (cranberry or apple), 4 glucose tablets, OR glucose gel. o Recheck blood sugar in 15 minutes after treatment (to make sure it is greater than 70 mg/dL). If your blood sugar is not greater than 70 mg/dL on recheck, call [phone] for further instructions. . Report your blood sugar to the short stay nurse when you get to Short Stay.  . If you are admitted to the hospital after surgery: o Your blood sugar will be checked by the staff and you will probably be given insulin after surgery (instead of oral diabetes medicines) to make sure you have good blood sugar levels. o The goal for blood sugar control after surgery is 80-180 mg/dL.   WHAT DO I DO ABOUT MY DIABETES MEDICATION?  Marland Kitchen  Do not take oral diabetes medicines (pills) the morning of surgery. .  .    The day of surgery, do not take other diabetes injectables, including Byetta (exenatide), Bydureon (exenatide ER), Victoza (liraglutide), or Trulicity (dulaglutide). .                                  You may not have any metal on your body including hair pins and              piercings             Do not wear jewelry, make-up, lotions, powders or perfumes, deodorant             Do not wear nail polish.  Do not shave  48 hours prior to surgery.     Do not bring valuables to the hospital. [place].  Contacts, dentures or bridgework may not be worn into surgery.      Patients discharged the  day of surgery will not be allowed to drive home.  IF YOU ARE HAVING SURGERY AND GOING HOME THE SAME DAY, YOU MUST HAVE AN ADULT TO DRIVE YOU HOME AND BE WITH YOU FOR 24 HOURS.  YOU MAY GO HOME BY TAXI OR UBER OR ORTHERWISE, BUT AN ADULT MUST ACCOMPANY YOU HOME AND STAY WITH YOU FOR 24 HOURS.  Name and phone number of your driver:  Special Instructions: N/A              Please read over the following fact sheets you were given: _____________________________________________________________________             [facility] - Preparing for Surgery  Before surgery, you can play an important role.   Because skin is not sterile, your skin needs to be as free of germs as possible.   You can reduce the number of germs on your skin by washing with CHG (chlorahexidine gluconate) soap before surgery.   CHG is an antiseptic cleaner which kills germs and bonds with the skin to continue killing germs even after washing. Please DO NOT use if you have an allergy to CHG or antibacterial soaps.   If your skin becomes reddened/irritated stop using the CHG and inform your nurse when you arrive at Short Stay. Do not shave (including legs and underarms) for at least 48 hours prior to the first CHG shower.  . Please follow these instructions carefully:  1.  Shower with CHG Soap the night before surgery and the  morning of Surgery.  2.  If you choose to wash your hair, wash your hair first as usual with your  normal  shampoo.  3.  After you shampoo, rinse your hair and body thoroughly to remove the  shampoo.                                        4.  Use CHG as you would any other liquid soap.  You can apply chg directly  to the skin and wash                       Gently with a scrungie or clean washcloth.  5.  Apply the CHG  Soap to your body ONLY FROM THE NECK DOWN.   Do not use on face/ open                           Wound or open sores. Avoid contact with eyes, ears mouth and genitals (private parts).                        Wash face,  Genitals (private parts) with your normal soap.             6.  Wash thoroughly, paying special attention to the area where your surgery  will be performed.  7.  Thoroughly rinse your body with warm water from the neck down.  8.  DO NOT shower/wash with your normal soap after using and rinsing off  the CHG Soap.             9.  Pat yourself dry with a clean towel.            10.  Wear clean pajamas.            11.  Place clean sheets on your bed the night of your first shower and do not  sleep with pets.  Day of Surgery : Do not apply any lotions/deodorants the morning of surgery.  Please wear clean clothes to the hospital/surgery center.   FAILURE TO FOLLOW THESE INSTRUCTIONS MAY RESULT IN THE CANCELLATION OF YOUR SURGERY PATIENT SIGNATURE_________________________________  NURSE SIGNATURE__________________________________  ________________________________________________________________________   Adam Phenix  An incentive spirometer is a tool that can help keep your lungs clear and active. This tool measures how well you are filling your lungs with each breath. Taking long deep breaths may help reverse or decrease the chance of developing breathing (pulmonary) problems (especially infection) following:  A long period of time when you are unable to move or be active. BEFORE THE PROCEDURE   If the spirometer includes an indicator to show your best effort, your nurse or respiratory therapist will set it to a desired goal.  If possible, sit up straight or lean slightly forward. Try not to slouch.  Hold the incentive spirometer in an upright position. INSTRUCTIONS FOR USE  1. Sit on the edge of your bed if possible, or sit up as far as you can in bed or on a chair. 2. Hold the incentive spirometer in an upright position. 3. Breathe out normally. 4. Place the mouthpiece in your mouth and seal your lips tightly around it. 5. Breathe in slowly and as  deeply as possible, raising the piston or the ball toward the top of the column. 6. Hold your breath for 3-5 seconds or for as long as possible. Allow the piston or ball to fall to the bottom of the column. 7. Remove the mouthpiece from your mouth and breathe out normally. 8. Rest for a few seconds and repeat Steps 1 through 7 at least 10 times every 1-2 hours when you are awake. Take your time and take a few normal breaths between deep breaths. 9. The spirometer may include an indicator to show your best effort. Use the indicator as a goal to work toward during each repetition. 10. After each set of 10 deep breaths, practice coughing to be sure your lungs are clear. If you have an incision (the cut made at the time of surgery), support your incision when coughing by placing a pillow or rolled up towels  firmly against it. Once you are able to get out of bed, walk around indoors and cough well. You may stop using the incentive spirometer when instructed by your caregiver.  RISKS AND COMPLICATIONS  Take your time so you do not get dizzy or light-headed.  If you are in pain, you may need to take or ask for pain medication before doing incentive spirometry. It is harder to take a deep breath if you are having pain. AFTER USE  Rest and breathe slowly and easily.  It can be helpful to keep track of a log of your progress. Your caregiver can provide you with a simple table to help with this. If you are using the spirometer at home, follow these instructions: [place] IF:   You are having difficultly using the spirometer.  You have trouble using the spirometer as often as instructed.  Your pain medication is not giving enough relief while using the spirometer.  You develop fever of 100.5 F (38.1 C) or higher. SEEK IMMEDIATE MEDICAL CARE IF:   You cough up bloody sputum that had not been present before.  You develop fever of 102 F (38.9 C) or greater.  You develop worsening pain  at or near the incision site. MAKE SURE YOU:   Understand these instructions.  Will watch your condition.  Will get help right away if you are not doing well or get worse. Document Released: 11/06/2006 Document Revised: 09/18/2011 Document Reviewed: 01/07/2007 [facility] Patient Information 2014 [place], [place].   ________________________________________________________________________

## 2019-03-19 ENCOUNTER — Encounter (HOSPITAL_COMMUNITY)
Admission: RE | Admit: 2019-03-19 | Discharge: 2019-03-19 | Disposition: A | Discharge status: Home / Self Care | Payer: Medicare Other | Source: Ambulatory Visit | Attending: Orthopaedic Surgery | Admitting: Orthopaedic Surgery

## 2019-03-19 ENCOUNTER — Ambulatory Visit (HOSPITAL_COMMUNITY)
Admission: RE | Admit: 2019-03-19 | Discharge: 2019-03-19 | Disposition: A | Discharge status: Home / Self Care | Payer: Medicare Other | Source: Ambulatory Visit | Attending: Orthopaedic Surgery | Admitting: Orthopaedic Surgery

## 2019-03-19 ENCOUNTER — Encounter (HOSPITAL_COMMUNITY): Payer: Self-pay

## 2019-03-19 ENCOUNTER — Other Ambulatory Visit: Payer: Self-pay

## 2019-03-19 DIAGNOSIS — K219 Gastro-esophageal reflux disease without esophagitis: Secondary | ICD-10-CM | POA: Diagnosis not present

## 2019-03-19 DIAGNOSIS — Z87891 Personal history of nicotine dependence: Secondary | ICD-10-CM | POA: Diagnosis not present

## 2019-03-19 DIAGNOSIS — Z01818 Encounter for other preprocedural examination: Secondary | ICD-10-CM | POA: Diagnosis not present

## 2019-03-19 DIAGNOSIS — E119 Type 2 diabetes mellitus without complications: Secondary | ICD-10-CM | POA: Insufficient documentation

## 2019-03-19 DIAGNOSIS — Z79899 Other long term (current) drug therapy: Secondary | ICD-10-CM | POA: Diagnosis not present

## 2019-03-19 DIAGNOSIS — M1712 Unilateral primary osteoarthritis, left knee: Secondary | ICD-10-CM | POA: Diagnosis not present

## 2019-03-19 DIAGNOSIS — Z7982 Long term (current) use of aspirin: Secondary | ICD-10-CM | POA: Diagnosis not present

## 2019-03-19 DIAGNOSIS — Z7984 Long term (current) use of oral hypoglycemic drugs: Secondary | ICD-10-CM | POA: Diagnosis not present

## 2019-03-19 HISTORY — DX: Other complications of anesthesia, initial encounter: T88.59XA

## 2019-03-19 LAB — CBC WITH DIFFERENTIAL/PLATELET
Abs Immature Granulocytes: 0.02 10*3/uL (ref 0.00–0.07)
Basophils Absolute: 0.1 10*3/uL (ref 0.0–0.1)
Basophils Relative: 1 %
Eosinophils Absolute: 0.2 10*3/uL (ref 0.0–0.5)
Eosinophils Relative: 2 %
HCT: 41.9 % (ref 36.0–46.0)
Hemoglobin: 12.9 g/dL (ref 12.0–15.0)
Immature Granulocytes: 0 %
Lymphocytes Relative: 34 %
Lymphs Abs: 2.8 10*3/uL (ref 0.7–4.0)
MCH: 26.3 pg (ref 26.0–34.0)
MCHC: 30.8 g/dL (ref 30.0–36.0)
MCV: 85.5 fL (ref 80.0–100.0)
Monocytes Absolute: 0.7 10*3/uL (ref 0.1–1.0)
Monocytes Relative: 8 %
Neutro Abs: 4.7 10*3/uL (ref 1.7–7.7)
Neutrophils Relative %: 55 %
Platelets: 339 10*3/uL (ref 150–400)
RBC: 4.9 MIL/uL (ref 3.87–5.11)
RDW: 15.3 % (ref 11.5–15.5)
WBC: 8.4 10*3/uL (ref 4.0–10.5)
nRBC: 0 % (ref 0.0–0.2)

## 2019-03-19 LAB — URINALYSIS, ROUTINE W REFLEX MICROSCOPIC
Bacteria, UA: NONE SEEN
Bilirubin Urine: NEGATIVE
Glucose, UA: >=500 mg/dL — AB
Hgb urine dipstick: NEGATIVE
Ketones, ur: NEGATIVE mg/dL
Leukocytes,Ua: NEGATIVE
Nitrite: NEGATIVE
Protein, ur: NEGATIVE mg/dL
Specific Gravity, Urine: 1.03 (ref 1.005–1.030)
pH: 5 (ref 5.0–8.0)

## 2019-03-19 LAB — PROTIME-INR
INR: 1 (ref 0.8–1.2)
Prothrombin Time: 13.1 seconds (ref 11.4–15.2)

## 2019-03-19 LAB — SURGICAL PCR SCREEN
MRSA, PCR: NEGATIVE
Staphylococcus aureus: NEGATIVE

## 2019-03-19 LAB — BASIC METABOLIC PANEL
Anion gap: 7 (ref 5–15)
BUN: 22 mg/dL (ref 8–23)
CO2: 24 mmol/L (ref 22–32)
Calcium: 8.6 mg/dL — ABNORMAL LOW (ref 8.9–10.3)
Chloride: 107 mmol/L (ref 98–111)
Creatinine, Ser: 0.73 mg/dL (ref 0.44–1.00)
GFR calc Af Amer: >60 mL/min (ref >60)
GFR calc non Af Amer: >60 mL/min (ref >60)
Glucose, Bld: 138 mg/dL — ABNORMAL HIGH (ref 70–99)
Potassium: 4.1 mmol/L (ref 3.5–5.1)
Sodium: 138 mmol/L (ref 135–145)

## 2019-03-19 LAB — APTT: aPTT: 32 seconds (ref 24–36)

## 2019-03-19 LAB — ABO/RH: ABO/RH(D): A POS

## 2019-03-19 NOTE — Progress Notes (Signed)
PCP - heather Goldfuss Cardiologist - Dr. Linard Millers  Chest x-ray -  EKG - 02/19/19 Stress Test - yes 2/20 ECHO - no Cardiac Cath - no  Sleep Study - no CPAP -   Fasting Blood Sugar - 130's Checks Blood Sugar "rare"_____ times a day  Blood Thinner Instructions:ASA managed by Dr. Farrel Gobble Aspirin Instructions:stop 5 days before surgery. Last Dose"I stopped it 03/13/19"  Anesthesia review:   Patient denies shortness of breath, fever, cough and chest pain at PAT appointment yes  Patient verbalized understanding of instructions that were given to them at the PAT appointment. Patient was also instructed that they will need to review over the PAT instructions again at home before surgery.  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  She has a history of a high spinal with one of her children and is a little apprehensive but still wants a spinal. She states that her blood pressure runs low with sedation but has not been a problem.

## 2019-03-20 LAB — HEMOGLOBIN A1C
Hgb A1c MFr Bld: 6.9 % — ABNORMAL HIGH (ref 4.8–5.6)
Mean Plasma Glucose: 151 mg/dL

## 2019-03-21 ENCOUNTER — Other Ambulatory Visit (HOSPITAL_COMMUNITY)
Admission: RE | Admit: 2019-03-21 | Discharge: 2019-03-21 | Disposition: A | Discharge status: Home / Self Care | Payer: Medicare Other | Source: Ambulatory Visit | Attending: Orthopaedic Surgery | Admitting: Orthopaedic Surgery

## 2019-03-21 DIAGNOSIS — Z01812 Encounter for preprocedural laboratory examination: Secondary | ICD-10-CM | POA: Insufficient documentation

## 2019-03-21 DIAGNOSIS — Z20828 Contact with and (suspected) exposure to other viral communicable diseases: Secondary | ICD-10-CM | POA: Diagnosis not present

## 2019-03-22 LAB — NOVEL CORONAVIRUS, NAA (HOSP ORDER, SEND-OUT TO REF LAB; TAT 18-24 HRS): SARS-CoV-2, NAA: NOT DETECTED

## 2019-03-24 MED ORDER — BUPIVACAINE LIPOSOME 1.3 % IJ SUSP
20.0000 mL | Freq: Once | INTRAMUSCULAR | Status: DC
  Filled 2019-03-24: qty 20

## 2019-03-24 MED ORDER — TRANEXAMIC ACID 1000 MG/10ML IV SOLN
2000.0000 mg | INTRAVENOUS | Status: DC
  Filled 2019-03-24: qty 20

## 2019-03-24 NOTE — Anesthesia Preprocedure Evaluation (Addendum)
Anesthesia Evaluation  Patient identified by MRN, date of birth, ID band Patient awake    Reviewed: Allergy & Precautions, NPO status , Patient's Chart, lab work & pertinent test results  Airway Mallampati: II  TM Distance: >3 FB Neck ROM: Full    Dental no notable dental hx. (+) Teeth Intact, Dental Advisory Given   Pulmonary former smoker,    Pulmonary exam normal breath sounds clear to auscultation       Cardiovascular negative cardio ROS Normal cardiovascular exam Rhythm:Regular Rate:Normal  Stress Test 2020 normal   Neuro/Psych  Headaches, negative psych ROS   GI/Hepatic Neg liver ROS, GERD  ,  Endo/Other  diabetes, Oral Hypoglycemic Agents  Renal/GU negative Renal ROS  negative genitourinary   Musculoskeletal  (+) Arthritis ,   Abdominal   Peds  Hematology negative hematology ROS (+)   Anesthesia Other Findings   Reproductive/Obstetrics                          Anesthesia Physical Anesthesia Plan  ASA: II  Anesthesia Plan: Spinal and Regional   Post-op Pain Management:  Regional for Post-op pain   Induction:   PONV Risk Score and Plan: 2 and Treatment may vary due to age or medical condition and Propofol infusion  Airway Management Planned: Natural Airway  Additional Equipment:   Intra-op Plan:   Post-operative Plan:   Informed Consent: I have reviewed the patients History and Physical, chart, labs and discussed the procedure including the risks, benefits and alternatives for the proposed anesthesia with the patient or authorized representative who has indicated his/her understanding and acceptance.     Dental advisory given  Plan Discussed with: CRNA  Anesthesia Plan Comments:        Anesthesia Quick Evaluation

## 2019-03-24 NOTE — Progress Notes (Signed)
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 former smoker (25 pack years, quit 09/01/06) with h/o DM II, GERD, migraines, left knee DJD scheduled for above procedure 03/25/2019 with Dr. Melrose Nakayama.   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  She has a history of a high spinal with one of her children and is a little apprehensive but still wants a spinal. She states that her blood pressure runs low with sedation but has not been a problem."  Cardiac clearance received 01/29/2019.  Per Kathyrn Drown, NP, "Natasha Franco was last seen on 08/12/2018 by Dr. Tamala Julian.  Since that day, Natasha Franco has done well from a cardiac perspective. She denies recurrent chest pain since that time.  Per Dr. Tamala Julian, okay to hold ASA as needed for procedure and resume when cleared by surgical team. Therefore, based on ACC/AHA guidelines, the patient would be at acceptable risk for the planned procedure without further cardiovascular testing."  Anticipate pt can proceed with planned procedure barring acute status change.   VS: BP (!) 121/52   Pulse 73   Temp 37.2 C (Oral)   Resp 16   Ht 5\' 3"  (1.6 m)   Wt 85.3 kg   SpO2 98%   BMI 33.30 kg/m   PROVIDERS: Goldfuss, Sharlette Dense, PA-C is PCP   Daneen Schick, MD is Cardiologist  LABS: Labs reviewed: Acceptable for surgery. (all labs ordered are listed, but only abnormal results are displayed)  Labs Reviewed  BASIC METABOLIC PANEL - Abnormal; Notable for the following components:      Result Value   Glucose, Bld 138 (*)    Calcium 8.6 (*)    All other components within normal limits  URINALYSIS, ROUTINE W REFLEX MICROSCOPIC - Abnormal; Notable for the following components:   Glucose, UA  >=500 (*)    All other components within normal limits  HEMOGLOBIN A1C - Abnormal; Notable for the following components:   Hgb A1c MFr Bld 6.9 (*)    All other components within normal limits  SURGICAL PCR SCREEN  APTT  CBC WITH DIFFERENTIAL/PLATELET  PROTIME-INR  TYPE AND SCREEN  ABO/RH     IMAGES: Chest Xray 03/19/2019 FINDINGS: The heart size and mediastinal contours are within normal limits. Both lungs are clear. The visualized skeletal structures are unremarkable.  IMPRESSION: Normal exam.   EKG: 08/12/2018 Rate 89 bpm Normal sinus rhythm  Cannot rule out inferior infarct, age undetermined Cannot rule out Anterior infarct, age undetermined  CV: Stress Test 08/21/2018  The left ventricular ejection fraction is normal (55-65%).  Nuclear stress EF: 65%.  Blood pressure demonstrated a blunted response to exercise.  Horizontal ST segment depression ST segment depression was noted during stress in the II and III leads, beginning at 5 minutes of stress, and returning to baseline after 5-9 minutes of recovery.  The study is normal.  This is a low risk study.   Ischemic ECG changes with normal perfusion images. This suggests no significant ischemia.  Past Medical History:  Diagnosis Date  . Arthritis   . Complication of anesthesia   . Diabetes mellitus without complication ([place])   . GERD (gastroesophageal reflux disease)   . Migraines   .  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 ABDOMINAL HYSTERECTOMY    . BREAST BIOPSY Left 2014  . BREAST SURGERY Bilateral 1984   reduction mammoplasty  . CARPAL TUNNEL RELEASE    . CESAREAN SECTION    . JOINT REPLACEMENT    . REDUCTION MAMMAPLASTY Bilateral   . ROTATOR CUFF REPAIR    . TONSILLECTOMY      MEDICATIONS: . aspirin 81 MG tablet  . azelastine (ASTELIN) 0.1 % nasal spray  . Biotin 1 MG CAPS  . Dulaglutide (TRULICITY) 1.5 0000000  SOPN  . empagliflozin (JARDIANCE) 25 MG TABS tablet  . escitalopram (LEXAPRO) 20 MG tablet  . Evolocumab (REPATHA SURECLICK) [id number] MG/ML SOAJ  . fluticasone (FLONASE) 50 MCG/ACT nasal spray  . ibuprofen (ADVIL) 800 MG tablet  . levocetirizine (XYZAL) 5 MG tablet  . metFORMIN (GLUCOPHAGE) 500 MG tablet  . montelukast (SINGULAIR) 10 MG tablet  . pantoprazole (PROTONIX) 40 MG tablet  . topiramate (TOPAMAX) 100 MG tablet  . ursodiol (ACTIGALL) 300 MG capsule   No current facility-administered medications for this encounter.    Derrill Memo ON 03/25/2019] bupivacaine liposome (EXPAREL) 1.3 % injection 266 mg  . [START ON 03/25/2019] tranexamic acid (CYKLOKAPRON) 2,000 mg in sodium chloride 0.9 % 50 mL Topical Application    Maia Plan [facility] Pre-Surgical Testing [phone] 03/24/19 10:45 AM

## 2019-03-24 NOTE — H&P (Signed)
TOTAL KNEE ADMISSION H&P  Patient is being admitted for left total knee arthroplasty.  Subjective:  Chief Complaint:left knee pain.  HPI: Natasha Franco, 68 y.o. female, has a history of pain and functional disability in the left knee due to arthritis and has failed non-surgical conservative treatments for greater than 12 weeks to includeNSAID's and/or analgesics, corticosteriod injections, flexibility and strengthening excercises, supervised PT with diminished ADL's post treatment, use of assistive devices, weight reduction as appropriate and activity modification.  Onset of symptoms was gradual, starting 5 years ago with gradually worsening course since that time. The patient noted prior procedures on the knee to include  arthroscopy on the left knee(s).  Patient currently rates pain in the left knee(s) at 10 out of 10 with activity. Patient has night pain, worsening of pain with activity and weight bearing, pain that interferes with activities of daily living, crepitus and joint swelling.  Patient has evidence of subchondral cysts, subchondral sclerosis, periarticular osteophytes and joint space narrowing by imaging studies. There is no active infection.  Patient Active Problem List   Diagnosis Date Noted  . Multiple pulmonary nodules 09/01/2016  . Cigarette smoker 09/01/2016  . Diabetes ([place]) 05/02/2013  . 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 Arthritis   . Complication of anesthesia   . Diabetes mellitus without complication ([place])   . GERD (gastroesophageal reflux disease)   . Migraines   . 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 ABDOMINAL HYSTERECTOMY    . BREAST BIOPSY Left 2014  . BREAST SURGERY Bilateral 1984   reduction mammoplasty  . CARPAL TUNNEL RELEASE    . CESAREAN SECTION    . JOINT REPLACEMENT    . REDUCTION MAMMAPLASTY Bilateral   . ROTATOR CUFF  REPAIR    . TONSILLECTOMY      Current Facility-Administered Medications  Medication Dose Route Frequency Provider Last Rate Last Dose  . [START ON 03/25/2019] bupivacaine liposome (EXPAREL) 1.3 % injection 266 mg  20 mL Other Once Melrose Nakayama, MD      . Derrill Memo ON 03/25/2019] tranexamic acid (CYKLOKAPRON) 2,000 mg in sodium chloride 0.9 % 50 mL Topical Application  123XX123 mg Topical To OR Melrose Nakayama, MD       Current Outpatient Medications  Medication Sig Dispense Refill Last Dose  . aspirin 81 MG tablet Take 81 mg by mouth daily.     Marland Kitchen azelastine (ASTELIN) 0.1 % nasal spray Place 2 sprays into both nostrils 2 (two) times daily. Use in each nostril as directed     . Biotin 1 MG CAPS Take 1 mg by mouth daily.     . Dulaglutide (TRULICITY) 1.5 0000000 SOPN Inject 1.5 mg into the skin every Thursday.      . empagliflozin (JARDIANCE) 25 MG TABS tablet Take 25 mg by mouth daily.     Marland Kitchen escitalopram (LEXAPRO) 20 MG tablet Take 20 mg by mouth at bedtime.      . Evolocumab (REPATHA SURECLICK) [id number] MG/ML SOAJ Inject 1 pen into the skin every 14 (fourteen) days. 2 pen 11   . fluticasone (FLONASE) 50 MCG/ACT nasal spray Place 2 sprays into both nostrils 2 (two) times daily.     Marland Kitchen ibuprofen (ADVIL) 800 MG tablet Take 800 mg by mouth 3 (three) times daily.     Marland Kitchen levocetirizine (XYZAL) 5 MG tablet Take 5 mg by  mouth every evening.     . metFORMIN (GLUCOPHAGE) 500 MG tablet Take 1,000 mg by mouth daily with supper.      . montelukast (SINGULAIR) 10 MG tablet Take 10 mg by mouth at bedtime.     . pantoprazole (PROTONIX) 40 MG tablet Take 40 mg by mouth 2 (two) times daily.      Marland Kitchen topiramate (TOPAMAX) 100 MG tablet Take 100 mg by mouth at bedtime.      . ursodiol (ACTIGALL) 300 MG capsule Take 300 mg by mouth 3 (three) times daily.       Allergies  Allergen Reactions  . Invokana [Canagliflozin]     VAGINAL YEAST INFECTIONS  . Statins   . Versed [Midazolam]     "Makes my mouth run"  . Zetia  [Ezetimibe] Cough  . Bupropion Hcl Rash    Social History   Tobacco Use  . Smoking status: Former Smoker    Packs/day: 1.00    Years: 25.00    Pack years: 25.00    Types: Cigarettes    Quit date: 09/01/2006    Years since quitting: 12.5  . Smokeless tobacco: Never Used  Substance Use Topics  . Alcohol use: No    Family History  Problem Relation Age of Onset  . COPD Father   . Diabetes Mellitus II Father      Review of Systems  Musculoskeletal: Positive for joint pain.       Left knee  All other systems reviewed and are negative.   Objective:  Physical Exam  Constitutional: She is oriented to person, place, and time. She appears well-developed and well-nourished.  HENT:  Head: Normocephalic and atraumatic.  Eyes: Pupils are equal, round, and reactive to light.  Neck: Normal range of motion.  Cardiovascular: Normal rate and regular rhythm.  Respiratory: Effort normal.  GI: Soft.  Musculoskeletal:     Comments: Left knee motion is about 0-120.  She has medial joint line pain and a mild varus deformity.  There is crepitation with no effusion.  She has a longitudinal medial scar consistent with cyst excision and some scope portal scars.  Hip motion is full and straight leg raise is negative.  Sensation and motor function are intact in her feet with palpable pulses on both sides.    Neurological: She is alert and oriented to person, place, and time.  Skin: Skin is warm and dry.  Psychiatric: She has a normal mood and affect. Her behavior is normal. Judgment and thought content normal.    Vital signs in last 24 hours:    Labs:   Estimated body mass index is 33.3 kg/m as calculated from the following:   Height as of 03/19/19: 5\' 3"  (1.6 m).   Weight as of 03/19/19: 85.3 kg.   Imaging Review Plain radiographs demonstrate severe degenerative joint disease of the left knee(s). The overall alignment isneutral. The bone quality appears to be good for age and reported  activity level.      Assessment/Plan:  End stage primary arthritis, left knee   The patient history, physical examination, clinical judgment of the provider and imaging studies are consistent with end stage degenerative joint disease of the left knee(s) and total knee arthroplasty is deemed medically necessary. The treatment options including medical management, injection therapy arthroscopy and arthroplasty were discussed at length. The risks and benefits of total knee arthroplasty were presented and reviewed. The risks due to aseptic loosening, infection, stiffness, patella tracking problems, thromboembolic complications and other  imponderables were discussed. The patient acknowledged the explanation, agreed to proceed with the plan and consent was signed. Patient is being admitted for inpatient treatment for surgery, pain control, PT, OT, prophylactic antibiotics, VTE prophylaxis, progressive ambulation and ADL's and discharge planning. The patient is planning to be discharged home with home health services     Patient's anticipated LOS is less than 2 midnights, meeting these requirements: - Younger than 37 - Lives within 1 hour of care - Has a competent adult at home to recover with post-op recover - NO history of  - Chronic pain requiring opiods  - Diabetes  - Coronary Artery Disease  - Heart failure  - Heart attack  - Stroke  - DVT/VTE  - Cardiac arrhythmia  - Respiratory Failure/COPD  - Renal failure  - Anemia  - Advanced Liver disease

## 2019-03-25 ENCOUNTER — Ambulatory Visit (HOSPITAL_COMMUNITY): Payer: Medicare Other | Admitting: Physician Assistant

## 2019-03-25 ENCOUNTER — Encounter (HOSPITAL_COMMUNITY): Payer: Self-pay

## 2019-03-25 ENCOUNTER — Observation Stay (HOSPITAL_COMMUNITY)
Admission: RE | Admit: 2019-03-25 | Discharge: 2019-03-26 | Disposition: A | Discharge status: Home / Self Care | Payer: Medicare Other | Source: Other Acute Inpatient Hospital | Attending: Orthopaedic Surgery | Admitting: Orthopaedic Surgery

## 2019-03-25 ENCOUNTER — Other Ambulatory Visit: Payer: Self-pay

## 2019-03-25 ENCOUNTER — Encounter (HOSPITAL_COMMUNITY)
Admission: RE | Disposition: A | Discharge status: Home / Self Care | Payer: Self-pay | Source: Other Acute Inpatient Hospital | Attending: Orthopaedic Surgery

## 2019-03-25 ENCOUNTER — Ambulatory Visit (HOSPITAL_COMMUNITY): Payer: Medicare Other | Admitting: Anesthesiology

## 2019-03-25 DIAGNOSIS — Z79899 Other long term (current) drug therapy: Secondary | ICD-10-CM | POA: Insufficient documentation

## 2019-03-25 DIAGNOSIS — K219 Gastro-esophageal reflux disease without esophagitis: Secondary | ICD-10-CM | POA: Insufficient documentation

## 2019-03-25 DIAGNOSIS — Z7982 Long term (current) use of aspirin: Secondary | ICD-10-CM | POA: Diagnosis not present

## 2019-03-25 DIAGNOSIS — Z87891 Personal history of nicotine dependence: Secondary | ICD-10-CM | POA: Diagnosis not present

## 2019-03-25 DIAGNOSIS — Z7984 Long term (current) use of oral hypoglycemic drugs: Secondary | ICD-10-CM | POA: Diagnosis not present

## 2019-03-25 DIAGNOSIS — M1712 Unilateral primary osteoarthritis, left knee: Principal | ICD-10-CM | POA: Insufficient documentation

## 2019-03-25 DIAGNOSIS — M25562 Pain in left knee: Secondary | ICD-10-CM | POA: Diagnosis present

## 2019-03-25 DIAGNOSIS — G43909 Migraine, unspecified, not intractable, without status migrainosus: Secondary | ICD-10-CM | POA: Diagnosis not present

## 2019-03-25 DIAGNOSIS — E119 Type 2 diabetes mellitus without complications: Secondary | ICD-10-CM | POA: Insufficient documentation

## 2019-03-25 HISTORY — PX: TOTAL KNEE ARTHROPLASTY: SHX125

## 2019-03-25 LAB — GLUCOSE, CAPILLARY
Glucose-Capillary: 137 mg/dL — ABNORMAL HIGH (ref 70–99)
Glucose-Capillary: 146 mg/dL — ABNORMAL HIGH (ref 70–99)
Glucose-Capillary: 231 mg/dL — ABNORMAL HIGH (ref 70–99)
Glucose-Capillary: 143 mg/dL — ABNORMAL HIGH (ref 70–99)

## 2019-03-25 LAB — TYPE AND SCREEN
ABO/RH(D): A POS
Antibody Screen: NEGATIVE

## 2019-03-25 SURGERY — ARTHROPLASTY, KNEE, TOTAL
Anesthesia: Regional | Site: Knee | Laterality: Left

## 2019-03-25 MED ORDER — TRANEXAMIC ACID-NACL 1000-0.7 MG/100ML-% IV SOLN
1000.0000 mg | INTRAVENOUS | Status: AC
  Administered 2019-03-25: 1000 mg via INTRAVENOUS

## 2019-03-25 MED ORDER — ACETAMINOPHEN 500 MG PO TABS
1000.0000 mg | ORAL_TABLET | Freq: Once | ORAL | Status: AC
  Administered 2019-03-25: 07:00:00 1000 mg via ORAL

## 2019-03-25 MED ORDER — PHENYLEPHRINE 40 MCG/ML (10ML) SYRINGE FOR IV PUSH (FOR BLOOD PRESSURE SUPPORT)
PREFILLED_SYRINGE | INTRAVENOUS | Status: DC | PRN
  Administered 2019-03-25 (×4): 40 ug via INTRAVENOUS

## 2019-03-25 MED ORDER — AZELASTINE HCL 0.1 % NA SOLN
2.0000 | Freq: Two times a day (BID) | NASAL | Status: DC
  Administered 2019-03-25 – 2019-03-26 (×2): 2 via NASAL
  Filled 2019-03-25: qty 30

## 2019-03-25 MED ORDER — ALUM & MAG HYDROXIDE-SIMETH 200-200-20 MG/5ML PO SUSP: 30.0000 mL | ORAL | Status: DC | PRN

## 2019-03-25 MED ORDER — TRANEXAMIC ACID 1000 MG/10ML IV SOLN
INTRAVENOUS | Status: DC | PRN
  Administered 2019-03-25: 2000 mg via TOPICAL

## 2019-03-25 MED ORDER — METHOCARBAMOL 500 MG IVPB - SIMPLE MED
500.0000 mg | Freq: Four times a day (QID) | INTRAVENOUS | Status: DC | PRN
  Administered 2019-03-25: 11:00:00 500 mg via INTRAVENOUS
  Filled 2019-03-25: qty 50

## 2019-03-25 MED ORDER — ASPIRIN 81 MG PO CHEW
81.0000 mg | CHEWABLE_TABLET | Freq: Two times a day (BID) | ORAL | Status: DC
  Administered 2019-03-26: 10:00:00 81 mg via ORAL
  Filled 2019-03-25: qty 1

## 2019-03-25 MED ORDER — ONDANSETRON HCL 4 MG/2ML IJ SOLN: 4.0000 mg | Freq: Four times a day (QID) | INTRAMUSCULAR | Status: DC | PRN

## 2019-03-25 MED ORDER — HYDROCODONE-ACETAMINOPHEN 7.5-325 MG PO TABS
1.0000 | ORAL_TABLET | ORAL | Status: DC | PRN
  Administered 2019-03-25: 13:00:00 1 via ORAL

## 2019-03-25 MED ORDER — ACETAMINOPHEN 325 MG PO TABS: 325.0000 mg | ORAL_TABLET | Freq: Four times a day (QID) | ORAL | Status: DC | PRN

## 2019-03-25 MED ORDER — BUPIVACAINE IN DEXTROSE 0.75-8.25 % IT SOLN
INTRATHECAL | Status: DC | PRN
  Administered 2019-03-25: 1.4 mL via INTRATHECAL

## 2019-03-25 MED ORDER — HYDROCODONE-ACETAMINOPHEN 5-325 MG PO TABS
ORAL_TABLET | ORAL | Status: AC
  Filled 2019-03-25: qty 1

## 2019-03-25 MED ORDER — SODIUM CHLORIDE (PF) 0.9 % IJ SOLN
INTRAMUSCULAR | Status: AC
  Filled 2019-03-25: qty 10

## 2019-03-25 MED ORDER — PROPOFOL 10 MG/ML IV BOLUS
INTRAVENOUS | Status: DC | PRN
  Administered 2019-03-25 (×3): 20 mg via INTRAVENOUS

## 2019-03-25 MED ORDER — PANTOPRAZOLE SODIUM 40 MG PO TBEC
40.0000 mg | DELAYED_RELEASE_TABLET | Freq: Two times a day (BID) | ORAL | Status: DC
  Administered 2019-03-25 – 2019-03-26 (×2): 40 mg via ORAL
  Filled 2019-03-25 (×2): qty 1

## 2019-03-25 MED ORDER — ESCITALOPRAM OXALATE 20 MG PO TABS
20.0000 mg | ORAL_TABLET | Freq: Every day | ORAL | Status: DC
  Administered 2019-03-25: 21:00:00 20 mg via ORAL
  Filled 2019-03-25: qty 1

## 2019-03-25 MED ORDER — LACTATED RINGERS IV SOLN
INTRAVENOUS | Status: DC
  Administered 2019-03-25 (×2): via INTRAVENOUS

## 2019-03-25 MED ORDER — FENTANYL CITRATE (PF) 100 MCG/2ML IJ SOLN
INTRAMUSCULAR | Status: DC | PRN
  Administered 2019-03-25 (×4): 50 ug via INTRAVENOUS

## 2019-03-25 MED ORDER — ACETAMINOPHEN 500 MG PO TABS
500.0000 mg | ORAL_TABLET | Freq: Four times a day (QID) | ORAL | Status: DC
  Administered 2019-03-25: 15:00:00 500 mg via ORAL
  Filled 2019-03-25: qty 1

## 2019-03-25 MED ORDER — LORATADINE 10 MG PO TABS
10.0000 mg | ORAL_TABLET | Freq: Every day | ORAL | Status: DC
  Administered 2019-03-26: 10 mg via ORAL
  Filled 2019-03-25: qty 1

## 2019-03-25 MED ORDER — BUPIVACAINE LIPOSOME 1.3 % IJ SUSP
INTRAMUSCULAR | Status: DC | PRN
  Administered 2019-03-25: 20 mL

## 2019-03-25 MED ORDER — FENTANYL CITRATE (PF) 100 MCG/2ML IJ SOLN
25.0000 ug | INTRAMUSCULAR | Status: DC | PRN
  Administered 2019-03-25: 25 ug via INTRAVENOUS
  Administered 2019-03-25: 11:00:00 50 ug via INTRAVENOUS

## 2019-03-25 MED ORDER — ACETAMINOPHEN 500 MG PO TABS
ORAL_TABLET | ORAL | Status: AC
  Administered 2019-03-25: 07:00:00 1000 mg via ORAL
  Filled 2019-03-25: qty 2

## 2019-03-25 MED ORDER — FENTANYL CITRATE (PF) 100 MCG/2ML IJ SOLN
INTRAMUSCULAR | Status: AC
  Filled 2019-03-25: qty 2

## 2019-03-25 MED ORDER — METHOCARBAMOL 500 MG IVPB - SIMPLE MED
INTRAVENOUS | Status: AC
  Administered 2019-03-25: 11:00:00 500 mg via INTRAVENOUS
  Filled 2019-03-25: qty 50

## 2019-03-25 MED ORDER — ROPIVACAINE HCL 5 MG/ML IJ SOLN
INTRAMUSCULAR | Status: DC | PRN
  Administered 2019-03-25: 20 mL via PERINEURAL

## 2019-03-25 MED ORDER — PROPOFOL 500 MG/50ML IV EMUL
INTRAVENOUS | Status: DC | PRN
  Administered 2019-03-25: 100 ug/kg/min via INTRAVENOUS

## 2019-03-25 MED ORDER — MIDAZOLAM HCL 2 MG/2ML IJ SOLN
INTRAMUSCULAR | Status: AC
  Filled 2019-03-25: qty 2

## 2019-03-25 MED ORDER — CEFAZOLIN SODIUM-DEXTROSE 2-4 GM/100ML-% IV SOLN
2.0000 g | INTRAVENOUS | Status: AC
  Administered 2019-03-25: 08:00:00 2 g via INTRAVENOUS

## 2019-03-25 MED ORDER — SODIUM CHLORIDE 0.9 % IV SOLN
INTRAVENOUS | Status: DC | PRN
  Administered 2019-03-25: 40 ug/min via INTRAVENOUS

## 2019-03-25 MED ORDER — METOCLOPRAMIDE HCL 5 MG PO TABS: 5.0000 mg | ORAL_TABLET | Freq: Three times a day (TID) | ORAL | Status: DC | PRN

## 2019-03-25 MED ORDER — LACTATED RINGERS IV SOLN
INTRAVENOUS | Status: DC
  Administered 2019-03-25 (×3): via INTRAVENOUS

## 2019-03-25 MED ORDER — BUPIVACAINE-EPINEPHRINE 0.5% -1:200000 IJ SOLN
INTRAMUSCULAR | Status: AC
  Filled 2019-03-25: qty 1

## 2019-03-25 MED ORDER — KETOROLAC TROMETHAMINE 15 MG/ML IJ SOLN
7.5000 mg | Freq: Four times a day (QID) | INTRAMUSCULAR | Status: AC
  Administered 2019-03-25 – 2019-03-26 (×4): 7.5 mg via INTRAVENOUS
  Filled 2019-03-25 (×3): qty 1

## 2019-03-25 MED ORDER — PROPOFOL 10 MG/ML IV BOLUS
INTRAVENOUS | Status: AC
  Filled 2019-03-25: qty 60

## 2019-03-25 MED ORDER — DOCUSATE SODIUM 100 MG PO CAPS
100.0000 mg | ORAL_CAPSULE | Freq: Two times a day (BID) | ORAL | Status: DC
  Administered 2019-03-25 – 2019-03-26 (×2): 100 mg via ORAL
  Filled 2019-03-25 (×2): qty 1

## 2019-03-25 MED ORDER — MENTHOL 3 MG MT LOZG: 1.0000 | LOZENGE | OROMUCOSAL | Status: DC | PRN

## 2019-03-25 MED ORDER — ONDANSETRON HCL 4 MG/2ML IJ SOLN
INTRAMUSCULAR | Status: AC
  Filled 2019-03-25: qty 2

## 2019-03-25 MED ORDER — DEXAMETHASONE SODIUM PHOSPHATE 10 MG/ML IJ SOLN
INTRAMUSCULAR | Status: DC | PRN
  Administered 2019-03-25: 5 mg
  Administered 2019-03-25: 10 mg via INTRAVENOUS

## 2019-03-25 MED ORDER — CEFAZOLIN SODIUM-DEXTROSE 2-4 GM/100ML-% IV SOLN
INTRAVENOUS | Status: AC
  Filled 2019-03-25: qty 100

## 2019-03-25 MED ORDER — FENTANYL CITRATE (PF) 100 MCG/2ML IJ SOLN
INTRAMUSCULAR | Status: AC
  Administered 2019-03-25: 50 ug via INTRAVENOUS
  Filled 2019-03-25: qty 4

## 2019-03-25 MED ORDER — METOCLOPRAMIDE HCL 5 MG/ML IJ SOLN: 5.0000 mg | Freq: Three times a day (TID) | INTRAMUSCULAR | Status: DC | PRN

## 2019-03-25 MED ORDER — POVIDONE-IODINE 10 % EX SWAB
2.0000 "application " | Freq: Once | CUTANEOUS | Status: AC
  Administered 2019-03-25: 2 via TOPICAL

## 2019-03-25 MED ORDER — METHOCARBAMOL 500 MG PO TABS
500.0000 mg | ORAL_TABLET | Freq: Four times a day (QID) | ORAL | Status: DC | PRN
  Administered 2019-03-25: 500 mg via ORAL
  Filled 2019-03-25: qty 1

## 2019-03-25 MED ORDER — LEVOCETIRIZINE DIHYDROCHLORIDE 5 MG PO TABS: 5.0000 mg | ORAL_TABLET | Freq: Every evening | ORAL | Status: DC

## 2019-03-25 MED ORDER — MORPHINE SULFATE (PF) 4 MG/ML IV SOLN: 0.5000 mg | INTRAVENOUS | Status: DC | PRN

## 2019-03-25 MED ORDER — PHENYLEPHRINE HCL (PRESSORS) 10 MG/ML IV SOLN
INTRAVENOUS | Status: AC
  Filled 2019-03-25: qty 1

## 2019-03-25 MED ORDER — URSODIOL 300 MG PO CAPS
300.0000 mg | ORAL_CAPSULE | Freq: Three times a day (TID) | ORAL | Status: DC
  Administered 2019-03-25 – 2019-03-26 (×2): 300 mg via ORAL
  Filled 2019-03-25 (×2): qty 1

## 2019-03-25 MED ORDER — HYDROCODONE-ACETAMINOPHEN 5-325 MG PO TABS
1.0000 | ORAL_TABLET | ORAL | Status: DC | PRN
  Administered 2019-03-25: 16:00:00 1 via ORAL
  Administered 2019-03-25: 21:00:00 2 via ORAL
  Administered 2019-03-25: 12:00:00 1 via ORAL
  Administered 2019-03-26 (×3): 2 via ORAL
  Filled 2019-03-25: qty 2
  Filled 2019-03-25: qty 1
  Filled 2019-03-25 (×3): qty 2

## 2019-03-25 MED ORDER — ONDANSETRON HCL 4 MG PO TABS: 4.0000 mg | ORAL_TABLET | Freq: Four times a day (QID) | ORAL | Status: DC | PRN

## 2019-03-25 MED ORDER — PHENOL 1.4 % MT LIQD: 1.0000 | OROMUCOSAL | Status: DC | PRN

## 2019-03-25 MED ORDER — KETOROLAC TROMETHAMINE 15 MG/ML IJ SOLN
INTRAMUSCULAR | Status: AC
  Administered 2019-03-25: 13:00:00 7.5 mg via INTRAVENOUS
  Filled 2019-03-25: qty 1

## 2019-03-25 MED ORDER — STERILE WATER FOR IRRIGATION IR SOLN
Status: DC | PRN
  Administered 2019-03-25: 2000 mL

## 2019-03-25 MED ORDER — TRANEXAMIC ACID-NACL 1000-0.7 MG/100ML-% IV SOLN
1000.0000 mg | Freq: Once | INTRAVENOUS | Status: AC
  Administered 2019-03-25: 1000 mg via INTRAVENOUS
  Filled 2019-03-25: qty 100

## 2019-03-25 MED ORDER — SODIUM CHLORIDE 0.9 % IJ SOLN
INTRAMUSCULAR | Status: DC | PRN
  Administered 2019-03-25: 60 mL

## 2019-03-25 MED ORDER — CHLORHEXIDINE GLUCONATE 4 % EX LIQD: 60.0000 mL | Freq: Once | CUTANEOUS | Status: DC

## 2019-03-25 MED ORDER — BISACODYL 5 MG PO TBEC: 5.0000 mg | DELAYED_RELEASE_TABLET | Freq: Every day | ORAL | Status: DC | PRN

## 2019-03-25 MED ORDER — MONTELUKAST SODIUM 10 MG PO TABS
10.0000 mg | ORAL_TABLET | Freq: Every day | ORAL | Status: DC
  Administered 2019-03-25: 10 mg via ORAL
  Filled 2019-03-25: qty 1

## 2019-03-25 MED ORDER — FLUTICASONE PROPIONATE 50 MCG/ACT NA SUSP
2.0000 | Freq: Two times a day (BID) | NASAL | Status: DC
  Administered 2019-03-25 – 2019-03-26 (×2): 2 via NASAL
  Filled 2019-03-25: qty 16

## 2019-03-25 MED ORDER — DULAGLUTIDE 1.5 MG/0.5ML ~~LOC~~ SOAJ: 1.5000 mg | SUBCUTANEOUS | Status: DC

## 2019-03-25 MED ORDER — METFORMIN HCL 500 MG PO TABS
1000.0000 mg | ORAL_TABLET | Freq: Every day | ORAL | Status: DC
  Administered 2019-03-25: 1000 mg via ORAL
  Filled 2019-03-25: qty 2

## 2019-03-25 MED ORDER — TOPIRAMATE 100 MG PO TABS
100.0000 mg | ORAL_TABLET | Freq: Every day | ORAL | Status: DC
  Administered 2019-03-25: 21:00:00 100 mg via ORAL
  Filled 2019-03-25: qty 1

## 2019-03-25 MED ORDER — CEFAZOLIN SODIUM-DEXTROSE 2-4 GM/100ML-% IV SOLN
2.0000 g | Freq: Four times a day (QID) | INTRAVENOUS | Status: AC
  Administered 2019-03-25 (×2): 2 g via INTRAVENOUS
  Filled 2019-03-25 (×2): qty 100

## 2019-03-25 MED ORDER — DIPHENHYDRAMINE HCL 12.5 MG/5ML PO ELIX: 12.5000 mg | ORAL_SOLUTION | ORAL | Status: DC | PRN

## 2019-03-25 MED ORDER — ONDANSETRON HCL 4 MG/2ML IJ SOLN
INTRAMUSCULAR | Status: DC | PRN
  Administered 2019-03-25 (×2): 2 mg via INTRAVENOUS

## 2019-03-25 MED ORDER — BUPIVACAINE-EPINEPHRINE (PF) 0.5% -1:200000 IJ SOLN
INTRAMUSCULAR | Status: DC | PRN
  Administered 2019-03-25: 30 mL

## 2019-03-25 MED ORDER — EMPAGLIFLOZIN 25 MG PO TABS: 25.0000 mg | ORAL_TABLET | Freq: Every day | ORAL | Status: DC

## 2019-03-25 MED ORDER — NON FORMULARY: 25.0000 mg | Freq: Every day | Status: DC

## 2019-03-25 MED ORDER — PROPOFOL 10 MG/ML IV BOLUS
INTRAVENOUS | Status: AC
  Filled 2019-03-25: qty 20

## 2019-03-25 MED ORDER — SODIUM CHLORIDE (PF) 0.9 % IJ SOLN
INTRAMUSCULAR | Status: AC
  Filled 2019-03-25: qty 50

## 2019-03-25 MED ORDER — INSULIN ASPART 100 UNIT/ML ~~LOC~~ SOLN
0.0000 [IU] | Freq: Three times a day (TID) | SUBCUTANEOUS | Status: DC
  Administered 2019-03-25: 17:00:00 5 [IU] via SUBCUTANEOUS

## 2019-03-25 MED ORDER — TRANEXAMIC ACID-NACL 1000-0.7 MG/100ML-% IV SOLN
INTRAVENOUS | Status: AC
  Filled 2019-03-25: qty 100

## 2019-03-25 MED ORDER — SODIUM CHLORIDE 0.9 % IR SOLN
Status: DC | PRN
  Administered 2019-03-25: 1000 mL

## 2019-03-25 MED ORDER — HYDROCODONE-ACETAMINOPHEN 7.5-325 MG PO TABS
ORAL_TABLET | ORAL | Status: AC
  Administered 2019-03-25: 1 via ORAL
  Filled 2019-03-25: qty 1

## 2019-03-25 MED ORDER — PHENYLEPHRINE 40 MCG/ML (10ML) SYRINGE FOR IV PUSH (FOR BLOOD PRESSURE SUPPORT)
PREFILLED_SYRINGE | INTRAVENOUS | Status: AC
  Filled 2019-03-25: qty 10

## 2019-03-25 MED ORDER — DEXAMETHASONE SODIUM PHOSPHATE 10 MG/ML IJ SOLN
INTRAMUSCULAR | Status: AC
  Filled 2019-03-25: qty 1

## 2019-03-25 SURGICAL SUPPLY — 52 items
ATTUNE PS FEM LT SZ 4 CEM KNEE (Femur) ×1 IMPLANT
ATTUNE PSRP INSR SZ4 5 KNEE (Insert) ×1 IMPLANT
BAG DECANTER FOR FLEXI CONT (MISCELLANEOUS) ×2 IMPLANT
BAG SPEC THK2 15X12 ZIP CLS (MISCELLANEOUS) ×1
BAG ZIPLOCK 12X15 (MISCELLANEOUS) ×2 IMPLANT
BASEPLATE TIBIAL ROTATING SZ 4 (Knees) ×1 IMPLANT
BLADE SAGITTAL 25.0X1.19X90 (BLADE) ×2 IMPLANT
BLADE SAW SGTL 13.0X1.19X90.0M (BLADE) ×2 IMPLANT
BNDG CMPR MED 10X6 ELC LF (GAUZE/BANDAGES/DRESSINGS) ×1
BNDG ELASTIC 6X10 VLCR STRL LF (GAUZE/BANDAGES/DRESSINGS) ×1 IMPLANT
BNDG ELASTIC 6X5.8 VLCR STR LF (GAUZE/BANDAGES/DRESSINGS) ×2 IMPLANT
BOOTIES KNEE HIGH SLOAN (MISCELLANEOUS) ×2 IMPLANT
BOWL SMART MIX CTS (DISPOSABLE) ×2 IMPLANT
BSPLAT TIB 4 CMNT ROT PLAT STR (Knees) ×1 IMPLANT
CEMENT HV SMART SET (Cement) ×4 IMPLANT
COVER SURGICAL LIGHT HANDLE (MISCELLANEOUS) ×2 IMPLANT
COVER WAND RF STERILE (DRAPES) IMPLANT
CUFF TOURN SGL QUICK 34 (TOURNIQUET CUFF) ×2
CUFF TRNQT CYL 34X4.125X (TOURNIQUET CUFF) ×1 IMPLANT
DECANTER SPIKE VIAL GLASS SM (MISCELLANEOUS) ×4 IMPLANT
DRAPE SHEET LG 3/4 BI-LAMINATE (DRAPES) ×2 IMPLANT
DRAPE TOP 10253 STERILE (DRAPES) ×2 IMPLANT
DRAPE U-SHAPE 47X51 STRL (DRAPES) ×2 IMPLANT
DRSG AQUACEL AG ADV 3.5X10 (GAUZE/BANDAGES/DRESSINGS) ×2 IMPLANT
DURAPREP 26ML APPLICATOR (WOUND CARE) ×4 IMPLANT
ELECT REM PT RETURN 15FT ADLT (MISCELLANEOUS) ×2 IMPLANT
GLOVE BIO SURGEON STRL SZ8 (GLOVE) ×4 IMPLANT
GLOVE BIOGEL PI IND STRL 8 (GLOVE) ×2 IMPLANT
GLOVE BIOGEL PI INDICATOR 8 (GLOVE) ×2
GOWN STRL REUS W/TWL XL LVL3 (GOWN DISPOSABLE) ×4 IMPLANT
HANDPIECE INTERPULSE COAX TIP (DISPOSABLE) ×2
HOLDER FOLEY CATH W/STRAP (MISCELLANEOUS) IMPLANT
HOOD PEEL AWAY FLYTE STAYCOOL (MISCELLANEOUS) ×6 IMPLANT
KIT TURNOVER KIT A (KITS) IMPLANT
MANIFOLD NEPTUNE II (INSTRUMENTS) ×2 IMPLANT
NS IRRIG 1000ML POUR BTL (IV SOLUTION) ×2 IMPLANT
PACK TOTAL KNEE CUSTOM (KITS) ×2 IMPLANT
PAD ARMBOARD 7.5X6 YLW CONV (MISCELLANEOUS) ×2 IMPLANT
PATELLA MEDIAL ATTUN 35MM KNEE (Knees) ×1 IMPLANT
PIN DRILL FIX HALF THREAD (BIT) ×1 IMPLANT
PIN STEINMAN FIXATION KNEE (PIN) ×1 IMPLANT
PROTECTOR NERVE ULNAR (MISCELLANEOUS) ×2 IMPLANT
SET HNDPC FAN SPRY TIP SCT (DISPOSABLE) ×1 IMPLANT
SUT ETHIBOND NAB CT1 #1 30IN (SUTURE) ×2 IMPLANT
SUT VIC AB 0 CT1 36 (SUTURE) ×2 IMPLANT
SUT VIC AB 2-0 CT1 27 (SUTURE) ×2
SUT VIC AB 2-0 CT1 TAPERPNT 27 (SUTURE) ×1 IMPLANT
SUT VIC AB 3-0 CT1 27 (SUTURE) ×2
SUT VIC AB 3-0 CT1 TAPERPNT 27 (SUTURE) ×1 IMPLANT
TRAY FOLEY MTR SLVR 16FR STAT (SET/KITS/TRAYS/PACK) IMPLANT
WATER STERILE IRR 1000ML POUR (IV SOLUTION) ×2 IMPLANT
WRAP KNEE MAXI GEL POST OP (GAUZE/BANDAGES/DRESSINGS) ×2 IMPLANT

## 2019-03-25 NOTE — Anesthesia Procedure Notes (Signed)
Anesthesia Regional Block: Adductor canal block   Pre-Anesthetic Checklist: ,, timeout performed, Correct Patient, Correct Site, Correct Laterality, Correct Procedure, Correct Position, site marked, Risks and benefits discussed,  Surgical consent,  Pre-op evaluation,  At surgeon's request and post-op pain management  Laterality: Left  Prep: Maximum Sterile Barrier Precautions used, chloraprep       Needles:  Injection technique: Single-shot  Needle Type: Echogenic Stimulator Needle     Needle Length: 9cm  Needle Gauge: 22     Additional Needles:   Procedures:,,,, ultrasound used (permanent image in chart),,,,  Narrative:  Start time: 03/25/2019 7:04 AM End time: 03/25/2019 7:14 AM Injection made incrementally with aspirations every 5 mL.  Performed by: Personally  Anesthesiologist: Freddrick March, MD  Additional Notes: Monitors applied. No increased pain on injection. No increased resistance to injection. Injection made in 5cc increments. Good needle visualization. Patient tolerated procedure well.

## 2019-03-25 NOTE — Anesthesia Postprocedure Evaluation (Signed)
Anesthesia Post Note  Patient: Natasha Franco  Procedure(s) Performed: Left Knee Arthroplasty (Left Knee)     Patient location during evaluation: PACU Anesthesia Type: Regional and Spinal Level of consciousness: oriented and awake and alert Pain management: pain level controlled Vital Signs Assessment: post-procedure vital signs reviewed and stable Respiratory status: spontaneous breathing, respiratory function stable and patient connected to nasal cannula oxygen Cardiovascular status: blood pressure returned to baseline and stable Postop Assessment: no headache, no backache and no apparent nausea or vomiting Anesthetic complications: no    Last Vitals:  Vitals:   03/25/19 1030 03/25/19 1045  BP: 103/62 (!) 113/59  Pulse: 76 70  Resp: 13 13  Temp:  36.8 C  SpO2: 94% 98%    Last Pain:  Vitals:   03/25/19 1045  TempSrc:   PainSc: 4     LLE Motor Response: Purposeful movement (03/25/19 1045) LLE Sensation: Decreased (03/25/19 1045) RLE Motor Response: Purposeful movement (03/25/19 1045) RLE Sensation: Decreased (03/25/19 1045) L Sensory Level: S2-Posterior medial thigh and lower leg (03/25/19 1045) R Sensory Level: S2-Posterior medial thigh and lower leg (03/25/19 1045)  Tarsha Blando L Cheng Dec

## 2019-03-25 NOTE — Evaluation (Signed)
Physical Therapy Evaluation Patient Details Name: Natasha Franco MRN: [id number] DOB: [birth year] Today's Date: 03/25/2019   History of Present Illness  Patient is a 68 y.o. female s/p Lt TKA on 03/25/19. She has PMH significant for HTN, GERD, and rotator cuff repair.  Clinical Impression  Natasha Franco is a 68 y.o. female POD 0 s/p Lt TKA with above PMH. She reports independence at baseline with no assistive device to mobilize and currently is limited by functional impairments (see PT Problem List below) and requires supervision for transfers and gait today with RW. She required min cues for safe hand placement with RW and improve with practice. She was educated on initial HEP for ROM and circulation as well. Patient will benefit from additional skilled PT interventions for a follow up to address impairments and progress to PLOF. Acute PT will follow to progress mobility and provide stair training in preparation of discharge home.    Follow Up Recommendations Follow surgeon's recommendation for DC plan and follow-up therapies    Equipment Recommendations  None recommended by PT    Recommendations for Other Services       Precautions / Restrictions Precautions Precautions: Fall Restrictions Weight Bearing Restrictions: No      Mobility  Bed Mobility Overal bed mobility: Needs Assistance Bed Mobility: Supine to Sit     Supine to sit: HOB elevated;Supervision     General bed mobility comments: HOB slight elevation, pt using bed rails  Transfers Overall transfer level: Needs assistance Equipment used: Rolling walker (2 wheeled) Transfers: Sit to/from Stand Sit to Stand: Supervision         General transfer comment: cues for safe and placement, pt able to initaite power up and steady herself in standing  Ambulation/Gait Ambulation/Gait assistance: Supervision Gait Distance (Feet): 50 Feet Assistive device: Rolling walker (2 wheeled) Gait Pattern/deviations: Step-through  pattern;Decreased stride length;Decreased stance time - left;Antalgic Gait velocity: decreased   General Gait Details: pt with mild antalgia during gait, min cues needed for hand placement at start of ambulation  Stairs            Wheelchair Mobility    Modified Rankin (Stroke Patients Only)       Balance Overall balance assessment: Needs assistance Sitting-balance support: Feet supported;No upper extremity supported Sitting balance-Leahy Scale: Good     Standing balance support: Bilateral upper extremity supported;During functional activity Standing balance-Leahy Scale: Fair Standing balance comment: pt able to perform static standing withou UE support to don face mask, UE support required for gait and dynamic activitites          Pertinent Vitals/Pain Pain Assessment: 0-10 Pain Score: 4  Pain Location: Lt knee Pain Descriptors / Indicators: Aching;Sore Pain Intervention(s): Limited activity within patient's tolerance;Monitored during session;Ice applied    Home Living Family/patient expects to be discharged to:: Private residence Living Arrangements: Spouse/significant other Available Help at Discharge: Family Type of Home: House Home Access: Stairs to enter Entrance Stairs-Rails: None Entrance Stairs-Number of Steps: 2 or 3 notes Home Layout: One level Home Equipment: Environmental consultant - 2 wheels;Cane - single point;Shower seat - built in;Bedside commode Additional Comments: pt reports her bathroom has a tub shower without a seat, her husbands has a walk in shower with a built in seat and she plans to use this when returning home    Prior Function Level of Independence: Independent               Hand Dominance  Extremity/Trunk Assessment   Upper Extremity Assessment Upper Extremity Assessment: Overall WFL for tasks assessed    Lower Extremity Assessment Lower Extremity Assessment: Overall WFL for tasks assessed;LLE deficits/detail LLE Deficits /  Details: pt able to perform good quad set and SLR wiht no extensor lag, 3+/5 or greater throughout, no bukcling during WB    Cervical / Trunk Assessment Cervical / Trunk Assessment: Normal  Communication   Communication: No difficulties  Cognition Arousal/Alertness: Awake/alert Behavior During Therapy: WFL for tasks assessed/performed Overall Cognitive Status: Within Functional Limits for tasks assessed           General Comments      Exercises Total Joint Exercises Ankle Circles/Pumps: AROM;Seated;10 reps;Both Quad Sets: AROM;5 reps;Left(2 sets) Heel Slides: AROM;5 reps;Seated(LE elevated on leg rest) Straight Leg Raises: AROM;5 reps;Left;Supine Long Arc Quad: AROM;5 reps;Seated;Left   Assessment/Plan    PT Assessment Patient needs continued PT services  PT Problem List Decreased strength;Decreased balance;Pain;Decreased mobility;Decreased range of motion;Decreased activity tolerance;Decreased knowledge of use of DME       PT Treatment Interventions DME instruction;Functional mobility training;Balance training;Patient/family education;Modalities;Therapeutic activities;Gait training;Therapeutic exercise;Stair training;Manual techniques    PT Goals (Current goals can be found in the Care Plan section)  Acute Rehab PT Goals Patient Stated Goal: to return home, looking forward to walking again PT Goal Formulation: With patient Time For Goal Achievement: 04/01/19 Potential to Achieve Goals: Good    Frequency 7X/week    AM-PAC PT "6 Clicks" Mobility  Outcome Measure Help needed turning from your back to your side while in a flat bed without using bedrails?: A Little Help needed moving from lying on your back to sitting on the side of a flat bed without using bedrails?: A Little Help needed moving to and from a bed to a chair (including a wheelchair)?: A Little Help needed standing up from a chair using your arms (e.g., wheelchair or bedside chair)?: A Little Help needed  to walk in hospital room?: A Little Help needed climbing 3-5 steps with a railing? : A Little 6 Click Score: 18    End of Session Equipment Utilized During Treatment: Gait belt Activity Tolerance: Patient tolerated treatment well Patient left: in chair;with call bell/phone within reach;with family/visitor present;with chair alarm set Nurse Communication: Mobility status PT Visit Diagnosis: Unsteadiness on feet (R26.81);Muscle weakness (generalized) (M62.81);Other abnormalities of gait and mobility (R26.89);Difficulty in walking, not elsewhere classified (R26.2);Pain Pain - Right/Left: Left Pain - part of body: Knee    Time: [id number] PT Time Calculation (min) (ACUTE ONLY): 25 min   Charges:   PT Evaluation $PT Eval Low Complexity: 1 Low PT Treatments $Therapeutic Exercise: 8-22 mins        Kipp Brood, PT, DPT, [facility] Physical Therapist with [facility]  03/25/2019 5:08 PM

## 2019-03-25 NOTE — Anesthesia Procedure Notes (Signed)
Procedure Name: MAC Date/Time: 03/25/2019 7:05 AM Performed by: Deliah Boston, CRNA Pre-anesthesia Checklist: Patient identified, Emergency Drugs available, Suction available and Patient being monitored Patient Re-evaluated:Patient Re-evaluated prior to induction Oxygen Delivery Method: Nasal cannula Preoxygenation: Pre-oxygenation with 100% oxygen Placement Confirmation: positive ETCO2 and breath sounds checked- equal and bilateral

## 2019-03-25 NOTE — Op Note (Signed)
PREOP DIAGNOSIS: DJD LEFT KNEE POSTOP DIAGNOSIS:  same PROCEDURE: LEFT TKR ANESTHESIA: Spinal and MAC ATTENDING SURGEON: Hessie Dibble ASSISTANT: Loni Dolly PA  INDICATIONS FOR PROCEDURE: Natasha Franco is a 68 y.o. female who has struggled for a long time with pain due to degenerative arthritis of the left knee.  The patient has failed many conservative non-operative measures and at this point has pain which limits the ability to sleep and walk.  The patient is offered total knee replacement.  Informed operative consent was obtained after discussion of possible risks of anesthesia, infection, neurovascular injury, DVT, and death.  The importance of the post-operative rehabilitation protocol to optimize result was stressed extensively with the patient.  SUMMARY OF FINDINGS AND PROCEDURE:  Natasha Franco was taken to the operative suite where under the above anesthesia a left knee replacement was performed.  There were advanced degenerative changes and the bone quality was excellent.  We used the DePuyAttune system and placed size 4 femur, 4 tibia, 35 mm all polyethylene patella, and a size 5 mm spacer.  Loni Dolly PA-C assisted throughout and was invaluable to the completion of the case in that he helped retract and maintain exposure while I placed the components.  He also helped close thereby minimizing OR time.  The patient was admitted for appropriate post-op care to include perioperative antibiotics and mechanical and pharmacologic measures for DVT prophylaxis.  DESCRIPTION OF PROCEDURE:  Natasha Franco was taken to the operative suite where the above anesthesia was applied.  The patient was positioned supine and prepped and draped in normal sterile fashion.  An appropriate time out was performed.  After the administration of kefzol pre-op antibiotic the leg was elevated and exsanguinated and a tourniquet inflated.  A standard longitudinal incision was made on the anterior knee.  Dissection was carried  down to the extensor mechanism.  All appropriate anti-infective measures were used including the pre-operative antibiotic, betadine impregnated drape, and closed hooded exhaust systems for each member of the surgical team.  A medial parapatellar incision was made in the extensor mechanism and the knee cap flipped and the knee flexed.  Some residual meniscal tissues were removed along with any remaining ACL/PCL tissue.  A guide was placed on the tibia and a flat cut was made on it's superior surface.  An intramedullary guide was placed in the femur and was utilized to make anterior and posterior cuts creating an appropriate flexion gap.  A second intramedullary guide was placed in the femur to make a distal cut properly balancing the knee with an extension gap equal to the flexion gap.  The three bones sized to the above mentioned sizes and the appropriate guides were placed and utilized.  A trial reduction was done and the knee easily came to full extension and the patella tracked well on flexion.  The trial components were removed and all bones were cleaned with pulsatile lavage and then dried thoroughly.  Cement was mixed and was pressurized onto the bones followed by placement of the aforementioned components.  Excess cement was trimmed and pressure was held on the components until the cement had hardened.  The tourniquet was deflated and a small amount of bleeding was controlled with cautery and pressure.  The knee was irrigated thoroughly.  The extensor mechanism was re-approximated with #1 ethibond in interrupted fashion.  The knee was flexed and the repair was solid.  The subcutaneous tissues were re-approximated with #0 and #2-0 vicryl and the skin  closed with a subcuticular stitch and steristrips.  A sterile dressing was applied.  Intraoperative fluids, EBL, and tourniquet time can be obtained from anesthesia records.  DISPOSITION:  The patient was taken to recovery room in stable condition and admitted  for appropriate post-op care to include peri-operative antibiotic and DVT prophylaxis with mechanical and pharmacologic measures.  Hessie Dibble 03/25/2019, 9:19 AM

## 2019-03-25 NOTE — Anesthesia Procedure Notes (Signed)
Spinal  Patient location during procedure: OR Start time: 03/25/2019 7:34 AM End time: 03/25/2019 7:44 AM Staffing Anesthesiologist: Freddrick March, MD Performed: anesthesiologist  Preanesthetic Checklist Completed: patient identified, surgical consent, pre-op evaluation, timeout performed, IV checked, risks and benefits discussed and monitors and equipment checked Spinal Block Patient position: sitting Prep: site prepped and draped and DuraPrep Patient monitoring: cardiac monitor, continuous pulse ox and blood pressure Approach: midline Location: L3-4 Injection technique: single-shot Needle Needle type: Tuohy and Sprotte  Needle gauge: 24 G Needle length: 9 cm Assessment Sensory level: T6 Additional Notes Functioning IV was confirmed and monitors were applied. Sterile prep and drape, including hand hygiene and sterile gloves were used. The patient was positioned and the spine was prepped. The skin was anesthetized with lidocaine.  Free flow of clear CSF was obtained prior to injecting local anesthetic into the CSF.  The spinal needle aspirated freely following injection.  The needle was carefully withdrawn.  The patient tolerated the procedure well.

## 2019-03-25 NOTE — Interval H&P Note (Signed)
History and Physical Interval Note:  03/25/2019 7:28 AM  Natasha Franco  has presented today for surgery, with the diagnosis of Left Knee Degenerative Joint Disease.  The various methods of treatment have been discussed with the patient and family. After consideration of risks, benefits and other options for treatment, the patient has consented to  Procedure(s): Left Knee Arthroplasty (Left) as a surgical intervention.  The patient's history has been reviewed, patient examined, no change in status, stable for surgery.  I have reviewed the patient's chart and labs.  Questions were answered to the patient's satisfaction.     Hessie Dibble

## 2019-03-25 NOTE — Transfer of Care (Signed)
Immediate Anesthesia Transfer of Care Note  Patient: Natasha Franco  Procedure(s) Performed: Procedure(s): Left Knee Arthroplasty (Left)  Patient Location: PACU  Anesthesia Type:Regional and Spinal  Level of Consciousness: Patient easily awoken, sedated, comfortable, cooperative, following commands, responds to stimulation.   Airway & Oxygen Therapy: Patient spontaneously breathing, ventilating well, oxygen via simple oxygen mask.  Post-op Assessment: Report given to PACU RN, vital signs reviewed and stable, moving all extremities.   Post vital signs: Reviewed and stable.  Complications: No apparent anesthesia complications  Last Vitals:  Vitals Value Taken Time  BP 104/52 03/25/19 0947  Temp    Pulse 80 03/25/19 0949  Resp 11 03/25/19 0949  SpO2 98 % 03/25/19 0949  Vitals shown include unvalidated device data.  Last Pain:  Vitals:   03/25/19 0610  TempSrc: Oral         Complications: No apparent anesthesia complications

## 2019-03-25 NOTE — Plan of Care (Signed)
Plan of care 

## 2019-03-25 NOTE — Anesthesia Procedure Notes (Signed)
Procedure Name: MAC Date/Time: 03/25/2019 8:14 AM Performed by: Deliah Boston, CRNA Pre-anesthesia Checklist: Patient identified, Emergency Drugs available, Suction available and Patient being monitored Patient Re-evaluated:Patient Re-evaluated prior to induction Oxygen Delivery Method: Simple face mask Airway Equipment and Method: Oral airway Placement Confirmation: positive ETCO2 and breath sounds checked- equal and bilateral Dental Injury: Teeth and Oropharynx as per pre-operative assessment

## 2019-03-26 ENCOUNTER — Encounter (HOSPITAL_COMMUNITY): Payer: Self-pay | Admitting: Orthopaedic Surgery

## 2019-03-26 DIAGNOSIS — M1712 Unilateral primary osteoarthritis, left knee: Secondary | ICD-10-CM | POA: Diagnosis not present

## 2019-03-26 LAB — GLUCOSE, CAPILLARY
Glucose-Capillary: 122 mg/dL — ABNORMAL HIGH (ref 70–99)
Glucose-Capillary: 138 mg/dL — ABNORMAL HIGH (ref 70–99)

## 2019-03-26 MED ORDER — ASPIRIN 81 MG PO CHEW: 81.0000 mg | CHEWABLE_TABLET | Freq: Two times a day (BID) | ORAL | 0 refills | Status: DC

## 2019-03-26 MED ORDER — HYDROCODONE-ACETAMINOPHEN 5-325 MG PO TABS: 1.0000 | ORAL_TABLET | Freq: Four times a day (QID) | ORAL | 0 refills | Status: DC | PRN

## 2019-03-26 MED ORDER — TIZANIDINE HCL 4 MG PO TABS: 4.0000 mg | ORAL_TABLET | Freq: Four times a day (QID) | ORAL | 1 refills | Status: AC | PRN

## 2019-03-26 NOTE — Progress Notes (Signed)
Subjective: 1 Day Post-Op Procedure(s) (LRB): Left Knee Arthroplasty (Left)   Patient doing well and is looking forward to going home.  Activity level:  wbat Diet tolerance:  ok Voiding:  Foley out this morning Patient reports pain as mild.    Objective: Vital signs in last 24 hours: Temp:  [97.2 F (36.2 C)-98.3 F (36.8 C)] 98 F (36.7 C) (09/16 0520) Pulse Rate:  [65-82] 65 (09/16 0520) Resp:  [12-17] 16 (09/16 0520) BP: (99-123)/(46-79) 113/56 (09/16 0520) SpO2:  [94 %-99 %] 97 % (09/16 0520) Weight:  [85 kg] 85 kg (09/15 1333)  Labs: No results for input(s): HGB in the last 72 hours. No results for input(s): WBC, RBC, HCT, PLT in the last 72 hours. No results for input(s): NA, K, CL, CO2, BUN, CREATININE, GLUCOSE, CALCIUM in the last 72 hours. No results for input(s): LABPT, INR in the last 72 hours.  Physical Exam:  Neurologically intact ABD soft Neurovascular intact Sensation intact distally Intact pulses distally Dorsiflexion/Plantar flexion intact Incision: dressing C/D/I and no drainage No cellulitis present Compartment soft  Assessment/Plan:  1 Day Post-Op Procedure(s) (LRB): Left Knee Arthroplasty (Left) Advance diet Up with therapy D/C IV fluids Discharge home with home health today after PT. Continue on 81mg  ASA bid x 2 weeks post op. Follow up in office 2 weeks post op.  Larwance Sachs Dwan Fennel 03/26/2019, 8:07 AM

## 2019-03-26 NOTE — Progress Notes (Signed)
Physical Therapy Treatment Patient Details Name: Natasha Franco MRN: [id number] DOB: [birth year] Today's Date: 03/26/2019    History of Present Illness Patient is a 68 y.o. female s/p Lt TKA on 03/25/19. She has PMH significant for HTN, GERD, and rotator cuff repair.    PT Comments    POD # 1 am session Assisted to bathroom, amb in hallway, practiced one step. Pt will need another PT session to address HEP.   Follow Up Recommendations  Follow surgeon's recommendation for DC plan and follow-up therapies     Equipment Recommendations  None recommended by PT    Recommendations for Other Services       Precautions / Restrictions Precautions Precautions: Fall Restrictions Weight Bearing Restrictions: No Other Position/Activity Restrictions: WBAT    Mobility  Bed Mobility               General bed mobility comments: OOB in recliner  Transfers Overall transfer level: Needs assistance Equipment used: Rolling walker (2 wheeled) Transfers: Sit to/from Stand Sit to Stand: Supervision         General transfer comment: 25% VC's on safety with turns  Ambulation/Gait Ambulation/Gait assistance: Supervision Gait Distance (Feet): 75 Feet Assistive device: Rolling walker (2 wheeled) Gait Pattern/deviations: Step-through pattern;Decreased stride length;Decreased stance time - left;Antalgic Gait velocity: decreased   General Gait Details: 25% VC's on safety with turns.  Tolerated an increased distance   Stairs Stairs: Yes Stairs assistance: Supervision;Min guard Stair Management: No rails;Step to pattern;Forwards;With walker Number of Stairs: 1 General stair comments: 25% on proper walker placement and sequencing/safety   Wheelchair Mobility    Modified Rankin (Stroke Patients Only)       Balance                                            Cognition Arousal/Alertness: Awake/alert Behavior During Therapy: WFL for tasks  assessed/performed Overall Cognitive Status: Within Functional Limits for tasks assessed                                        Exercises      General Comments        Pertinent Vitals/Pain Pain Assessment: 0-10 Pain Score: 5  Pain Location: Lt knee Pain Descriptors / Indicators: Aching;Sore;Tightness Pain Intervention(s): Monitored during session;Premedicated before session;Repositioned;Ice applied    Home Living                      Prior Function            PT Goals (current goals can now be found in the care plan section) Progress towards PT goals: Progressing toward goals    Frequency    7X/week      PT Plan Current plan remains appropriate    Co-evaluation              AM-PAC PT "6 Clicks" Mobility   Outcome Measure  Help needed turning from your back to your side while in a flat bed without using bedrails?: A Little Help needed moving from lying on your back to sitting on the side of a flat bed without using bedrails?: A Little Help needed moving to and from a bed to a chair (including a wheelchair)?: A Little Help needed standing up from  a chair using your arms (e.g., wheelchair or bedside chair)?: A Little Help needed to walk in hospital room?: A Little Help needed climbing 3-5 steps with a railing? : A Little 6 Click Score: 18    End of Session Equipment Utilized During Treatment: Gait belt Activity Tolerance: Patient tolerated treatment well Patient left: in chair;with call bell/phone within reach;with family/visitor present;with chair alarm set Nurse Communication: Mobility status PT Visit Diagnosis: Unsteadiness on feet (R26.81);Muscle weakness (generalized) (M62.81);Other abnormalities of gait and mobility (R26.89);Difficulty in walking, not elsewhere classified (R26.2);Pain     Time: [id number] PT Time Calculation (min) (ACUTE ONLY): 25 min  Charges:  $Gait Training: 8-22 mins $Therapeutic Activity: 8-22  mins                     Rica Koyanagi  PTA Acute  Rehabilitation Services Pager      [phone] Office      [phone]

## 2019-03-26 NOTE — Discharge Summary (Signed)
Patient ID: Natasha Franco MRN: [id number] DOB/AGE: Sep 15, 1950 68 y.o.  Admit date: 03/25/2019 Discharge date: 03/26/2019  Admission Diagnoses:  Principal Problem:   Primary osteoarthritis of left knee   Discharge Diagnoses:  Same  Past Medical History:  Diagnosis Date  . Arthritis   . Complication of anesthesia   . Diabetes mellitus without complication ([place])   . GERD (gastroesophageal reflux disease)   . Migraines   . Primary biliary cirrhosis ([place])    after taking Lipitor    Surgeries: Procedure(s): Left Knee Arthroplasty on 03/25/2019   Consultants:   Discharged Condition: Improved  Hospital Course: Natasha Franco is an 68 y.o. female who was admitted 03/25/2019 for operative treatment ofPrimary osteoarthritis of left knee. Patient has severe unremitting pain that affects sleep, daily activities, and work/hobbies. After pre-op clearance the patient was taken to the operating room on 03/25/2019 and underwent  Procedure(s): Left Knee Arthroplasty.    Patient was given perioperative antibiotics:  Anti-infectives (From admission, onward)   Start     Dose/Rate Route Frequency Ordered Stop   03/25/19 1400  ceFAZolin (ANCEF) IVPB 2g/100 mL premix     2 g 200 mL/hr over 30 Minutes Intravenous Every 6 hours 03/25/19 1329 03/25/19 2103   03/25/19 0616  ceFAZolin (ANCEF) 2-4 GM/100ML-% IVPB    Note to Pharmacy: Karsten Ro   : cabinet override      03/25/19 0616 03/25/19 0746   03/25/19 0615  ceFAZolin (ANCEF) IVPB 2g/100 mL premix     2 g 200 mL/hr over 30 Minutes Intravenous On call to O.R. 03/25/19 [id number] 03/25/19 0746       Patient was given sequential compression devices, early ambulation, and chemoprophylaxis to prevent DVT.  Patient benefited maximally from hospital stay and there were no complications.    Recent vital signs:  Patient Vitals for the past 24 hrs:  BP Temp Temp src Pulse Resp SpO2 Height Weight  03/26/19 0520 (!) 113/56 98 F (36.7 C) Oral 65 16 97 %  - -  03/26/19 0051 (!) 104/57 98 F (36.7 C) Oral 65 14 96 % - -  03/25/19 2014 (!) 117/51 97.7 F (36.5 C) Oral 66 16 95 % - -  03/25/19 1626 (!) 112/46 (!) 97.5 F (36.4 C) - 68 16 96 % - -  03/25/19 1547 (!) 115/55 (!) 97.2 F (36.2 C) - 69 16 98 % - -  03/25/19 1443 (!) 103/49 - - 72 17 95 % - -  03/25/19 1333 123/61 (!) 97.3 F (36.3 C) Oral 66 16 98 % 5\' 3"  (1.6 m) 85 kg  03/25/19 1300 99/71 97.9 F (36.6 C) - 67 14 96 % - -  03/25/19 1200 (!) 113/55 - - 69 15 97 % - -  03/25/19 1145 109/64 97.9 F (36.6 C) - 80 14 99 % - -  03/25/19 1130 103/60 - - 70 13 98 % - -  03/25/19 1115 (!) 113/58 - - 79 14 96 % - -  03/25/19 1100 108/79 - - 81 14 96 % - -  03/25/19 1045 (!) 113/59 98.3 F (36.8 C) - 70 13 98 % - -  03/25/19 1030 103/62 - - 76 13 94 % - -  03/25/19 1015 (!) 108/56 - - 82 15 95 % - -  03/25/19 1000 (!) 110/58 - - 75 13 95 % - -  03/25/19 0947 (!) 104/52 98.2 F (36.8 C) - 81 12 98 % - -  Recent laboratory studies: No results for input(s): WBC, HGB, HCT, PLT, NA, K, CL, CO2, BUN, CREATININE, GLUCOSE, INR, CALCIUM in the last 72 hours.  Invalid input(s): PT, 2   Discharge Medications:   Allergies as of 03/26/2019      Reactions   Invokana [canagliflozin]    VAGINAL YEAST INFECTIONS   Statins    Versed [midazolam]    "Makes my mouth run"   Zetia [ezetimibe] Cough   Bupropion Hcl Rash      Medication List    STOP taking these medications   aspirin 81 MG tablet Replaced by: aspirin 81 MG chewable tablet     TAKE these medications   aspirin 81 MG chewable tablet Chew 1 tablet (81 mg total) by mouth 2 (two) times daily. Replaces: aspirin 81 MG tablet   azelastine 0.1 % nasal spray Commonly known as: ASTELIN Place 2 sprays into both nostrils 2 (two) times daily. Use in each nostril as directed   Biotin 1 MG Caps Take 1 mg by mouth daily.   escitalopram 20 MG tablet Commonly known as: LEXAPRO Take 20 mg by mouth at bedtime.   Evolocumab 140  MG/ML Soaj Commonly known as: Repatha SureClick Inject 1 pen into the skin every 14 (fourteen) days.   fluticasone 50 MCG/ACT nasal spray Commonly known as: FLONASE Place 2 sprays into both nostrils 2 (two) times daily.   HYDROcodone-acetaminophen 5-325 MG tablet Commonly known as: NORCO/VICODIN Take 1-2 tablets by mouth every 6 (six) hours as needed for moderate pain (pain score 4-6).   ibuprofen 800 MG tablet Commonly known as: ADVIL Take 800 mg by mouth 3 (three) times daily.   Jardiance 25 MG Tabs tablet Generic drug: empagliflozin Take 25 mg by mouth daily.   levocetirizine 5 MG tablet Commonly known as: XYZAL Take 5 mg by mouth every evening.   metFORMIN 500 MG tablet Commonly known as: GLUCOPHAGE Take 1,000 mg by mouth daily with supper.   montelukast 10 MG tablet Commonly known as: SINGULAIR Take 10 mg by mouth at bedtime.   pantoprazole 40 MG tablet Commonly known as: PROTONIX Take 40 mg by mouth 2 (two) times daily.   tiZANidine 4 MG tablet Commonly known as: Zanaflex Take 1 tablet (4 mg total) by mouth every 6 (six) hours as needed.   topiramate 100 MG tablet Commonly known as: TOPAMAX Take 100 mg by mouth at bedtime.   Trulicity 1.5 0000000 Sopn Generic drug: Dulaglutide Inject 1.5 mg into the skin every Thursday.   ursodiol 300 MG capsule Commonly known as: ACTIGALL Take 300 mg by mouth 3 (three) times daily.            Durable Medical Equipment  (From admission, onward)         Start     Ordered   03/25/19 1329  DME Walker rolling  Once    Question:  Patient needs a walker to treat with the following condition  Answer:  Primary osteoarthritis of left knee   03/25/19 1329   03/25/19 1329  DME 3 n 1  Once     03/25/19 1329   03/25/19 1329  DME Bedside commode  Once    Question:  Patient needs a bedside commode to treat with the following condition  Answer:  Primary osteoarthritis of left knee   03/25/19 1329          Diagnostic  Studies: Dg Chest 2 View  Result Date: 03/19/2019 CLINICAL DATA:  Pre operative respiratory exam. EXAM:  CHEST - 2 VIEW COMPARISON:  Chest CT dated 02/12/2018 FINDINGS: The heart size and mediastinal contours are within normal limits. Both lungs are clear. The visualized skeletal structures are unremarkable. IMPRESSION: Normal exam. Electronically Signed   By: Lorriane Shire M.D.   On: 03/19/2019 15:57    Disposition: Discharge disposition: 01-Home or Self Care       Discharge Instructions    Call MD / Call 911   Complete by: As directed    If you experience chest pain or shortness of breath, CALL 911 and be transported to the hospital emergency room.  If you develope a fever above 101 F, pus (white drainage) or increased drainage or redness at the wound, or calf pain, call your surgeon's office.   Constipation Prevention   Complete by: As directed    Drink plenty of fluids.  Prune juice may be helpful.  You may use a stool softener, such as Colace (over the counter) 100 mg twice a day.  Use MiraLax (over the counter) for constipation as needed.   Diet - low sodium heart healthy   Complete by: As directed    Discharge instructions   Complete by: As directed    INSTRUCTIONS AFTER JOINT REPLACEMENT   Remove items at home which could result in a fall. This includes throw rugs or furniture in walking pathways ICE to the affected joint every three hours while awake for 30 minutes at a time, for at least the first 3-5 days, and then as needed for pain and swelling.  Continue to use ice for pain and swelling. You may notice swelling that will progress down to the foot and ankle.  This is normal after surgery.  Elevate your leg when you are not up walking on it.   Continue to use the breathing machine you got in the hospital (incentive spirometer) which will help keep your temperature down.  It is common for your temperature to cycle up and down following surgery, especially at night when you are  not up moving around and exerting yourself.  The breathing machine keeps your lungs expanded and your temperature down.   DIET:  As you were doing prior to hospitalization, we recommend a well-balanced diet.  DRESSING / WOUND CARE / SHOWERING  You may shower 3 days after surgery, but keep the wounds dry during showering.  You may use an occlusive plastic wrap (Press'n Seal for example), NO SOAKING/SUBMERGING IN THE BATHTUB.  If the bandage gets wet, change with a clean dry gauze.  If the incision gets wet, pat the wound dry with a clean towel.  ACTIVITY  Increase activity slowly as tolerated, but follow the weight bearing instructions below.   No driving for 6 weeks or until further direction given by your physician.  You cannot drive while taking narcotics.  No lifting or carrying greater than 10 lbs. until further directed by your surgeon. Avoid periods of inactivity such as sitting longer than an hour when not asleep. This helps prevent blood clots.  You may return to work once you are authorized by your doctor.     WEIGHT BEARING   Weight bearing as tolerated with assist device (walker, cane, etc) as directed, use it as long as suggested by your surgeon or therapist, typically at least 4-6 weeks.   EXERCISES  Results after joint replacement surgery are often greatly improved when you follow the exercise, range of motion and muscle strengthening exercises prescribed by your doctor. Safety measures are also  important to protect the joint from further injury. Any time any of these exercises cause you to have increased pain or swelling, decrease what you are doing until you are comfortable again and then slowly increase them. If you have problems or questions, call your caregiver or physical therapist for advice.   Rehabilitation is important following a joint replacement. After just a few days of immobilization, the muscles of the leg can become weakened and shrink (atrophy).  These  exercises are designed to build up the tone and strength of the thigh and leg muscles and to improve motion. Often times heat used for twenty to thirty minutes before working out will loosen up your tissues and help with improving the range of motion but do not use heat for the first two weeks following surgery (sometimes heat can increase post-operative swelling).   These exercises can be done on a training (exercise) mat, on the floor, on a table or on a bed. Use whatever works the best and is most comfortable for you.    Use music or television while you are exercising so that the exercises are a pleasant break in your day. This will make your life better with the exercises acting as a break in your routine that you can look forward to.   Perform all exercises about fifteen times, three times per day or as directed.  You should exercise both the operative leg and the other leg as well.   Exercises include:   Quad Sets - Tighten up the muscle on the front of the thigh (Quad) and hold for 5-10 seconds.   Straight Leg Raises - With your knee straight (if you were given a brace, keep it on), lift the leg to 60 degrees, hold for 3 seconds, and slowly lower the leg.  Perform this exercise against resistance later as your leg gets stronger.  Leg Slides: Lying on your back, slowly slide your foot toward your buttocks, bending your knee up off the floor (only go as far as is comfortable). Then slowly slide your foot back down until your leg is flat on the floor again.  Angel Wings: Lying on your back spread your legs to the side as far apart as you can without causing discomfort.  Hamstring Strength:  Lying on your back, push your heel against the floor with your leg straight by tightening up the muscles of your buttocks.  Repeat, but this time bend your knee to a comfortable angle, and push your heel against the floor.  You may put a pillow under the heel to make it more comfortable if necessary.   A  rehabilitation program following joint replacement surgery can speed recovery and prevent re-injury in the future due to weakened muscles. Contact your doctor or a physical therapist for more information on knee rehabilitation.    CONSTIPATION  Constipation is defined medically as fewer than three stools per week and severe constipation as less than one stool per week.  Even if you have a regular bowel pattern at home, your normal regimen is likely to be disrupted due to multiple reasons following surgery.  Combination of anesthesia, postoperative narcotics, change in appetite and fluid intake all can affect your bowels.   YOU MUST use at least one of the following options; they are listed in order of increasing strength to get the job done.  They are all available over the counter, and you may need to use some, POSSIBLY even all of these options:  Drink plenty of fluids (prune juice may be helpful) and high fiber foods Colace 100 mg by mouth twice a day  Senokot for constipation as directed and as needed Dulcolax (bisacodyl), take with full glass of water  Miralax (polyethylene glycol) once or twice a day as needed.  If you have tried all these things and are unable to have a bowel movement in the first 3-4 days after surgery call either your surgeon or your primary doctor.    If you experience loose stools or diarrhea, hold the medications until you stool forms back up.  If your symptoms do not get better within 1 week or if they get worse, check with your doctor.  If you experience "the worst abdominal pain ever" or develop nausea or vomiting, please contact the office immediately for further recommendations for treatment.   ITCHING:  If you experience itching with your medications, try taking only a single pain pill, or even half a pain pill at a time.  You can also use Benadryl over the counter for itching or also to help with sleep.   TED HOSE STOCKINGS:  Use stockings on both legs until  for at least 2 weeks or as directed by physician office. They may be removed at night for sleeping.  MEDICATIONS:  See your medication summary on the "After Visit Summary" that nursing will review with you.  You may have some home medications which will be placed on hold until you complete the course of blood thinner medication.  It is important for you to complete the blood thinner medication as prescribed.  PRECAUTIONS:  If you experience chest pain or shortness of breath - call 911 immediately for transfer to the hospital emergency department.   If you develop a fever greater that 101 F, purulent drainage from wound, increased redness or drainage from wound, foul odor from the wound/dressing, or calf pain - CONTACT YOUR SURGEON.                                                   FOLLOW-UP APPOINTMENTS:  If you do not already have a post-op appointment, please call the office for an appointment to be seen by your surgeon.  Guidelines for how soon to be seen are listed in your "After Visit Summary", but are typically between 1-4 weeks after surgery.  OTHER INSTRUCTIONS:   Knee Replacement:  Do not place pillow under knee, focus on keeping the knee straight while resting. CPM instructions: 0-90 degrees, 2 hours in the morning, 2 hours in the afternoon, and 2 hours in the evening. Place foam block, curve side up under heel at all times except when in CPM or when walking.  DO NOT modify, tear, cut, or change the foam block in any way.  MAKE SURE YOU:  Understand these instructions.  Get help right away if you are not doing well or get worse.    Thank you for letting us be a part of your medical care team.  It is a privilege we respect greatly.  We hope these instructions will help you stay on track for a fast and full recovery!   Increase activity slowly as tolerated   Complete by: As directed       Follow-up Information    Melrose Nakayama, MD. Schedule an appointment as soon as possible for a  visit in 2 weeks.   Specialty: Orthopedic Surgery Contact information: [place] [address] [phone]            Signed: Larwance Sachs Tameah Mihalko 03/26/2019, 8:11 AM

## 2019-03-26 NOTE — Progress Notes (Signed)
Physical Therapy Treatment Patient Details Name: Natasha Franco MRN: [id number] DOB: Nov 23, 1950 Today's Date: 03/26/2019    History of Present Illness Patient is a 68 y.o. female s/p Lt TKA on 03/25/19. She has PMH significant for HTN, GERD, and rotator cuff repair.    PT Comments    POD # 1 pm session Spouse present during second session. Perform some TE's following HEP handout.  Instructed on proper tech, freq as well as use of ICE.  Addressed all mobility questions, discussed appropriate activity, educated on use of ICE.  Pt ready for D/C to home.    Follow Up Recommendations  Follow surgeon's recommendation for DC plan and follow-up therapies     Equipment Recommendations  None recommended by PT    Recommendations for Other Services       Precautions / Restrictions Precautions Precautions: Fall Restrictions Weight Bearing Restrictions: No Other Position/Activity Restrictions: WBAT    Mobility  Bed Mobility               General bed mobility comments: OOB in recliner  Transfers Overall transfer level: Needs assistance Equipment used: Rolling walker (2 wheeled) Transfers: Sit to/from Stand Sit to Stand: Supervision         General transfer comment: 25% VC's on safety with turns  Ambulation/Gait Ambulation/Gait assistance: Supervision Gait Distance (Feet): 75 Feet Assistive device: Rolling walker (2 wheeled) Gait Pattern/deviations: Step-through pattern;Decreased stride length;Decreased stance time - left;Antalgic Gait velocity: decreased   General Gait Details: 25% VC's on safety with turns.  Tolerated an increased distance   Chief Executive Officer Rankin (Stroke Patients Only)       Balance                                            Cognition Arousal/Alertness: Awake/alert Behavior During Therapy: WFL for tasks assessed/performed Overall Cognitive Status: Within Functional Limits for tasks assessed                                         Exercises   Total Knee Replacement TE's 10 reps B LE ankle pumps 10 reps towel squeezes 10 reps knee presses 10 reps heel slides  10 reps SAQ's 10 reps SLR's 10 reps ABD Followed by ICE     General Comments        Pertinent Vitals/Pain Pain Assessment: 0-10 Pain Score: 5  Pain Location: Lt knee Pain Descriptors / Indicators: Aching;Sore;Tightness Pain Intervention(s): Monitored during session;Premedicated before session;Repositioned;Ice applied    Home Living                      Prior Function            PT Goals (current goals can now be found in the care plan section) Progress towards PT goals: Progressing toward goals    Frequency    7X/week      PT Plan Current plan remains appropriate    Co-evaluation              AM-PAC PT "6 Clicks" Mobility   Outcome Measure  Help needed turning from your back to your side while in a flat bed without using bedrails?: A Little Help needed moving from lying on your back to sitting  on the side of a flat bed without using bedrails?: A Little Help needed moving to and from a bed to a chair (including a wheelchair)?: A Little Help needed standing up from a chair using your arms (e.g., wheelchair or bedside chair)?: A Little Help needed to walk in hospital room?: A Little Help needed climbing 3-5 steps with a railing? : A Little 6 Click Score: 18    End of Session Equipment Utilized During Treatment: Gait belt Activity Tolerance: Patient tolerated treatment well Patient left: in chair;with call bell/phone within reach;with family/visitor present;with chair alarm set Nurse Communication: Mobility status PT Visit Diagnosis: Unsteadiness on feet (R26.81);Muscle weakness (generalized) (M62.81);Other abnormalities of gait and mobility (R26.89);Difficulty in walking, not elsewhere classified (R26.2);Pain     Time: 1016-1040 PT Time Calculation (min)  (ACUTE ONLY): 24 min  Charges:   $Therapeutic Exercise: 8-22 mins $Self Care/Home Management: [place]  PTA Acute  Rehabilitation Services Pager      [phone] Office      [phone]

## 2019-03-26 NOTE — Plan of Care (Signed)
  Problem: Education: Goal: Knowledge of General Education information will improve Description: Including pain rating scale, medication(s)/side effects and non-pharmacologic comfort measures Outcome: Progressing   Problem: Coping: Goal: Level of anxiety will decrease Outcome: Progressing   Problem: Nutrition: Goal: Adequate nutrition will be maintained Outcome: Progressing   Problem: Pain Managment: Goal: General experience of comfort will improve Outcome: Progressing   

## 2019-03-26 NOTE — Plan of Care (Signed)
done

## 2019-04-01 ENCOUNTER — Telehealth: Payer: Self-pay | Admitting: Pharmacist

## 2019-04-01 NOTE — Telephone Encounter (Signed)
Received lipid panel results from PCP office. LDL at goal, LDL was 48. Reviewed with patient. No issues with Repatha. Will continue Repatha 140mg  q 14 days

## 2019-04-15 ENCOUNTER — Other Ambulatory Visit: Payer: Self-pay

## 2019-04-15 ENCOUNTER — Ambulatory Visit: Payer: Medicare Other | Attending: Orthopaedic Surgery

## 2019-04-15 DIAGNOSIS — M25562 Pain in left knee: Secondary | ICD-10-CM | POA: Diagnosis not present

## 2019-04-15 NOTE — Patient Instructions (Signed)
Access Code: [email or web address]  URL: [email or web address]  Date: 04/15/2019  Prepared by: Roxana Hires   Exercises Supine Knee Extension Stretch on Towel Roll - 2x daily - 7x weekly Supine Heel Slide with Strap - 2x daily - 7x weekly Hamstring Stretch with Strap - 3 Reps - 30 seconds Hold - 2x daily - 7x weekly Supine Active Straight Leg Raise - 10 reps - 2 sets - 1x daily - 7x weekly Mini Squat - 10 Reps - 2 Sets - 2x daily - 7x weekly Sit to Stand without Arm Support - 10 reps - 2 sets - 2x daily - 7x weekly

## 2019-04-15 NOTE — Therapy (Signed)
[place] MAIN [facility] SERVICES [address] [place], [place], [address] Phone: [phone]   Fax:  [phone]  Physical Therapy Evaluation  Patient Details  Name: Natasha Franco MRN: [id number] Date of Birth: 20-Dec-1950 Referring Provider (PT): Dr. Rhona Raider   Encounter Date: 04/15/2019  PT End of Session - 04/15/19 0806    Visit Number  1    Number of Visits  25    Date for PT Re-Evaluation  07/08/19    Authorization Type  eval: 04/15/19    PT Start Time  0808    PT Stop Time  0900    PT Time Calculation (min)  52 min    Equipment Utilized During Treatment  Gait belt    Activity Tolerance  Patient tolerated treatment well    Behavior During Therapy  [facility] for tasks assessed/performed       Past Medical History:  Diagnosis Date  . Arthritis   . Complication of anesthesia   . Diabetes mellitus without complication ([place])   . GERD (gastroesophageal reflux disease)   . Migraines   . 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 ABDOMINAL HYSTERECTOMY    . BREAST BIOPSY Left 2014  . BREAST SURGERY Bilateral 1984   reduction mammoplasty  . CARPAL TUNNEL RELEASE    . CESAREAN SECTION    . JOINT REPLACEMENT    . REDUCTION MAMMAPLASTY Bilateral   . ROTATOR CUFF REPAIR    . TONSILLECTOMY    . TOTAL KNEE ARTHROPLASTY Left 03/25/2019   Procedure: Left Knee Arthroplasty;  Surgeon: Melrose Nakayama, MD;  Location: [facility];  Service: Orthopedics;  Laterality: Left;    There were no vitals filed for this visit.   Subjective Assessment - 04/15/19 0804    Subjective  L TKR    Pertinent History  Pt underwent L TKR 03/25/19 without any reported post-op complications. She had a previous surgery to L knee many years prior for a meniscal cyst. Pt reports no issues with R knee. Pt had her 10 day follow-up without with orthopedics without any issues. She had 5 visits of [facility] PT before  coming to outpatient. She has a CPM machine at home and has been able to increase the range of motion for flexion to 105 degrees. Next orthopedic follow-up appointment is 04/23/19. She has been using her pneumatic compression/ice machine at home as well.    Limitations  Walking    How long can you stand comfortably?  30 minutes    Patient Stated Goals  Decrease pain and improve knee strength    Currently in Pain?  Yes    Pain Score  3    Worst: 8/10, Best: 0/10   Pain Location  Knee    Pain Orientation  Left    Pain Type  Surgical pain    Pain Radiating Towards  None    Pain Onset  1 to 4 weeks ago    Pain Frequency  Intermittent    Aggravating Factors   Extended standing (30 minutes)    Pain Relieving Factors  meds, rest, compression, ice    Multiple Pain Sites  No         [facility] PT Assessment - 04/15/19 0805      Assessment   Medical Diagnosis  L TKR    Referring Provider (PT)  Dr. Rhona Raider    Onset Date/Surgical Date  03/25/19  Hand Dominance  Right    Next MD Visit  04/23/19    Prior Therapy  [facility] PT      Precautions   Precautions  None      Restrictions   Weight Bearing Restrictions  No      Balance Screen   Has the patient fallen in the past 6 months  No    Has the patient had a decrease in activity level because of a fear of falling?   Yes    Is the patient reluctant to leave their home because of a fear of falling?   No      Home Environment   Living Environment  Private residence    Living Arrangements  Spouse/significant other    Available Help at Discharge  Family      Prior Function   Level of Independence  Independent    Vocation  Part time employment    Occupational psychologist    Leisure  Reading, walking      Cognition   Overall Cognitive Status  Within Functional Limits for tasks assessed        SUBJECTIVE Chief complaint: Pt underwent L TKR 03/25/19 without any reported post-op complications. She had a previous surgery to L knee many  years prior for a meniscal cyst. Pt reports no issues with R knee. Pt had her 10 day follow-up without with orthopedics without any issues. She had 5 visits of [facility] PT before coming to outpatient. She has a CPM machine at home and has been able to increase the range of motion for flexion to 105 degrees. Next orthopedic follow-up appointment is 04/23/19. She has been using her pneumatic compression/ice machine at home as well. Follow-up appt with MD: 04/23/19 Pain: 3/10 Present, 0/10 Best, 8/10 Worst Aggravating factors: Extended standing (30 minutes); Easing factors: meds, rest, compression, ice 24 hour pain behavior: Worse at end of day Knee surgery: Yes Pain quality: pain quality: aching, fullness, clicking Radiating symptoms: No  Numbness/Tingling: No Dominant hand: right   OBJECTIVE  MUSCULOSKELETAL: Tremor: Absent Bulk: Normal Tone: Normal, no spasticity, rigidity, or clonus No trophic changes noted to lower extremities. No ecchymosis, erythema, or edema noted around knee. No gross knee deformity noted. No swelling, redness, or bruising around L knee. No signs of infection.  Posture No gross abnormalities noted in standing or seated posture  Lumbar/Hip AROM: WFL and painless with overpressure in all planes  Gait Mildly antalgic gait noted with decreased stance time on the L and decreased R step length;  Palpation No pain to palpation along medial and lateral joint line of knee. No pain with palpation to quadriceps or hamstrings.  Strength R/L 5/5 Hip flexion 4+/4 Hip external rotation 5/4+ Hip internal rotation 4-/4- Hip extension  5/4- Hip abduction 4/4- Hip adduction 5/5 Knee extension 5/5 Knee flexion 5/4+ Ankle Dorsiflexion 5/5 Ankle Plantarflexion 5/5 Ankle Inversion 5/4+ Ankle Eversion *indicates pain  AROM Knee R/L Flexion: 135/108  Extension: -5 (hyperextension)/*5 degrees *indicates pain  Hip IR/ER is approximately 40-45 degrees  bilaterally Hamstrings 80 degrees bilaterally HS: 6 inches between heel and gluts on RLE, significantly more limited on LLE at around 90 degrees of flexion;   NEUROLOGICAL:  Mental Status Patient is oriented to person, place and time.  Recent memory is intact.  Remote memory is intact.  Attention span and concentration are intact.  Expressive speech is intact.  Patient's fund of knowledge is within normal limits for educational level.  Sensation Grossly intact  to light touch bilateral LEs as determined by testing dermatomes L2-S2 Proprioception and hot/cold testing deferred on this date  Reflexes Deferred  VASCULAR Dorsalis pedis and posterior tibial pulses are palpable  LEFS: 31/80 21m: 9.6s = 1.04 m/s, 7.8s = 1.28 m/s 5TSTS: 10.9s       Objective measurements completed on examination: See above findings.              PT Education - 04/15/19 1017    Education Details  Plan of care and HEP    Person(s) Educated  Patient    Methods  Explanation    Comprehension  Verbalized understanding       PT Short Term Goals - 04/15/19 0929      PT SHORT TERM GOAL #1   Title  Pt will be independent with HEP in order to decrease kne pain and increase strength in order to improve pain-free function at home.    Time  4    Period  Weeks    Status  New    Target Date  05/13/19        PT Long Term Goals - 04/15/19 0929      PT LONG TERM GOAL #1   Title  Pt will increase LEFS by at least 9 points in order to demonstrate significant improvement in lower extremity function.    Baseline  04/15/19: 31/80    Time  12    Period  Weeks    Status  New    Target Date  07/08/19      PT LONG TERM GOAL #2   Title  Pt will decrease worst pain as reported on NPRS by at least 3 points in order to demonstrate clinically significant reduction in ankle/foot pain.    Baseline  04/15/19: 8/10    Time  12    Period  Weeks    Status  New    Target Date  07/08/19      PT LONG  TERM GOAL #3   Title  Pt will increase strength of L hip abduction and adduction by at least 1/2 MMT grade in order to demonstrate improvement in strength and function    Baseline  04/15/19: 4-/5 for both L hip abduction and adduction    Time  12    Period  Weeks    Status  New    Target Date  07/08/19      PT LONG TERM GOAL #4   Title  Pt will be able to stand for at least 50 minutes in order to be able to cook in the kitchen without needing to sit and rest    Baseline  04/15/19: 30 minutes max standing time    Time  12    Status  New    Target Date  07/08/19             Plan - 04/15/19 [id number]    Clinical Impression Statement  Pt is a pleasant 68 year-old female referred s/p L TKR on 03/25/19. She has already made great progress since surgery. The scar is healing well without any signs of infection. Her L knee AAROM is -5 to 108 degrees with very gentle overpressure. She has a mildly antalgic gait but has already progressed to ambulating without an assistive device. She has some proximal weakness in L hip abductors, adductors, and extensors. Gait speed and 5TSTS are WNL but LEFS of 31/80 indicates significant limitations in function as related to LLE. She will benefit  from PT services to address deficits in strength, mobility, and pain in order to return to full function at home with less knee pain.    Personal Factors and Comorbidities  Age;Comorbidity 1    Comorbidities  DM    Examination-Activity Limitations  Locomotion Level;Squat;Stairs    Examination-Participation Restrictions  Laundry;Shop;Volunteer;Community Activity    Stability/Clinical Decision Making  Stable/Uncomplicated    Clinical Decision Making  Low    Rehab Potential  Excellent    PT Frequency  2x / week    PT Duration  12 weeks    PT Treatment/Interventions  ADLs/Self Care Home Management;Aquatic Therapy;Canalith Repostioning;Cryotherapy;Electrical Stimulation;Iontophoresis 4mg /ml Dexamethasone;Traction;Moist  Heat;Ultrasound;DME Instruction;Gait training;Functional mobility training;Stair training;Therapeutic activities;Therapeutic exercise;Balance training;Neuromuscular re-education;Patient/family education;Manual techniques;Passive range of motion;Dry needling;Splinting;Vestibular;Joint Manipulations    PT Next Visit Plan  NMES to L quad with quad sets, review HEP, evaluate stairs, gentle stretching, advance LLE strengthening    PT Home Exercise Plan  Access Code: [email or web address]    Consulted and Agree with Plan of Care  Patient       Patient will benefit from skilled therapeutic intervention in order to improve the following deficits and impairments:  Abnormal gait, Difficulty walking, Decreased strength, Pain  Visit Diagnosis: Acute pain of left knee     Problem List Patient Active Problem List   Diagnosis Date Noted  . Primary osteoarthritis of left knee 03/25/2019  . Multiple pulmonary nodules 09/01/2016  . Cigarette smoker 09/01/2016  . Diabetes ([place]) 05/02/2013  . Primary biliary cirrhosis ([place]) 05/02/2013   Phillips Grout PT, DPT, GCS  , 04/15/2019, 10:18 AM  [place] [facility]IN [facility] SERVICES [address] [place], [place], [address] Phone: [phone]   Fax:  [phone]  Name: Natasha Franco MRN: [id number] Date of Birth: 01-Aug-1950

## 2019-04-18 ENCOUNTER — Ambulatory Visit: Payer: Medicare Other

## 2019-04-18 ENCOUNTER — Other Ambulatory Visit: Payer: Self-pay

## 2019-04-18 DIAGNOSIS — M25562 Pain in left knee: Secondary | ICD-10-CM

## 2019-04-18 NOTE — Therapy (Signed)
[place] MAIN [facility] SERVICES [address] [place], [place], [address] Phone: [phone]   Fax:  [phone]  Physical Therapy Treatment  Patient Details  Name: Natasha Franco MRN: [id number] Date of Birth: 1951-06-05 Referring Provider (PT): Dr. Rhona Raider   Encounter Date: 04/18/2019  PT End of Session - 04/18/19 0806    Visit Number  2    Number of Visits  25    Date for PT Re-Evaluation  07/08/19    Authorization Type  eval: 04/15/19    PT Start Time  0759    PT Stop Time  0845    PT Time Calculation (min)  46 min    Equipment Utilized During Treatment  Gait belt    Activity Tolerance  Patient tolerated treatment well    Behavior During Therapy  [facility] for tasks assessed/performed       Past Medical History:  Diagnosis Date  . Arthritis   . Complication of anesthesia   . Diabetes mellitus without complication ([place])   . GERD (gastroesophageal reflux disease)   . Migraines   . 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 ABDOMINAL HYSTERECTOMY    . BREAST BIOPSY Left 2014  . BREAST SURGERY Bilateral 1984   reduction mammoplasty  . CARPAL TUNNEL RELEASE    . CESAREAN SECTION    . JOINT REPLACEMENT    . REDUCTION MAMMAPLASTY Bilateral   . ROTATOR CUFF REPAIR    . TONSILLECTOMY    . TOTAL KNEE ARTHROPLASTY Left 03/25/2019   Procedure: Left Knee Arthroplasty;  Surgeon: Melrose Nakayama, MD;  Location: [facility];  Service: Orthopedics;  Laterality: Left;    There were no vitals filed for this visit.  Subjective Assessment - 04/18/19 0804    Subjective  Patient reports she was sore after evaluation. Reports no "pain" feels very stiff/full, and tight and heavy. Not walking with any AD.    Pertinent History  Pt underwent L TKR 03/25/19 without any reported post-op complications. She had a previous surgery to L knee many years prior for a meniscal cyst. Pt reports no  issues with R knee. Pt had her 10 day follow-up without with orthopedics without any issues. She had 5 visits of [facility] PT before coming to outpatient. She has a CPM machine at home and has been able to increase the range of motion for flexion to 105 degrees. Next orthopedic follow-up appointment is 04/23/19. She has been using her pneumatic compression/ice machine at home as well.    Limitations  Walking    How long can you stand comfortably?  30 minutes    Patient Stated Goals  Decrease pain and improve knee strength    Currently in Pain?  Yes    Pain Score  2     Pain Location  Knee    Pain Orientation  Left    Pain Descriptors / Indicators  Tightness;Heaviness    Pain Type  Surgical pain    Pain Onset  1 to 4 weeks ago    Pain Frequency  Intermittent        Treatment:  Nustep L2, 3 minutes RPM>50 for knee mobility and muscle tissue lengthening for optimal performance during session.    Supine:  -contract relax against PT shoulder into flexion/extension with 10 second holds 15x with increasing arc of motion with repetition -flexion PROM of L knee 8x 20 second holds, use  of relaxation and distraction coversation for increasing flexion with hold/repetition -scar tissue massage, patient educated on proper pressure, direction, duration and purpose x 6 minutes -patella mobilization: inferior, superior, medial, lateral: limited medially due to swelling/tenderness/fluid. 5 seconds x 8 each direction -extension PROM holds for 20 seconds x 8 with use of relaxation and distraction conversation for increased arc of motion -ice cup massage (end of session) 3 minutes: education on performance at home if desired  Seated: LAQ with increasing hold times to 3 second holds, cueing for neutral alignment and muscular activation LLE 15x  Standing: Weight shifts in front of mirror with focus on acceptance onto LLE x2 minutes; use of flexing/extending  Knees for shift "booty bump" for home performance Weight  shifts in front of mirror with leg left for slow march with focus on single limb stability/weight acceptance on LLE   Measurements after interventions for respective flexing/extending:performed in supine: Passively performed with PT overpressure  Flexion 115   Extension -4     Pt educated throughout session about proper posture and technique with exercises. Improved exercise technique, movement at target joints, use of target muscles after min to mod verbal, visual, tactile cues.                   PT Education - 04/18/19 0806    Education Details  exercise technique, body mechanics    Person(s) Educated  Patient    Methods  Explanation;Demonstration;Tactile cues;Verbal cues    Comprehension  Verbalized understanding;Returned demonstration;Verbal cues required       PT Short Term Goals - 04/15/19 0929      PT SHORT TERM GOAL #1   Title  Pt will be independent with HEP in order to decrease kne pain and increase strength in order to improve pain-free function at home.    Time  4    Period  Weeks    Status  New    Target Date  05/13/19        PT Long Term Goals - 04/15/19 0929      PT LONG TERM GOAL #1   Title  Pt will increase LEFS by at least 9 points in order to demonstrate significant improvement in lower extremity function.    Baseline  04/15/19: 31/80    Time  12    Period  Weeks    Status  New    Target Date  07/08/19      PT LONG TERM GOAL #2   Title  Pt will decrease worst pain as reported on NPRS by at least 3 points in order to demonstrate clinically significant reduction in ankle/foot pain.    Baseline  04/15/19: 8/10    Time  12    Period  Weeks    Status  New    Target Date  07/08/19      PT LONG TERM GOAL #3   Title  Pt will increase strength of L hip abduction and adduction by at least 1/2 MMT grade in order to demonstrate improvement in strength and function    Baseline  04/15/19: 4-/5 for both L hip abduction and adduction    Time  12     Period  Weeks    Status  New    Target Date  07/08/19      PT LONG TERM GOAL #4   Title  Pt will be able to stand for at least 50 minutes in order to be able to cook in the kitchen without  needing to sit and rest    Baseline  04/15/19: 30 minutes max standing time    Time  12    Status  New    Target Date  07/08/19            Plan - 04/18/19 1250    Clinical Impression Statement  Patient presents to physical therapy with excellent motivation and improved mobility. Measurements of available range of motion passively performed demonstrated improved muscle tissue lengthening and available arc of motion after manual/therex. Patient responded well to weight shifting and demonstrated ability to self correct for optimal weightbearing on affected limb. She will benefit from PT services to address deficits in strength, mobility, and pain in order to return to full function at home with less knee pain.    Personal Factors and Comorbidities  Age;Comorbidity 1    Comorbidities  DM    Examination-Activity Limitations  Locomotion Level;Squat;Stairs    Examination-Participation Restrictions  Laundry;Shop;Volunteer;Community Activity    Stability/Clinical Decision Making  Stable/Uncomplicated    Rehab Potential  Excellent    PT Frequency  2x / week    PT Duration  12 weeks    PT Treatment/Interventions  ADLs/Self Care Home Management;Aquatic Therapy;Canalith Repostioning;Cryotherapy;Electrical Stimulation;Iontophoresis 4mg /ml Dexamethasone;Traction;Moist Heat;Ultrasound;DME Instruction;Gait training;Functional mobility training;Stair training;Therapeutic activities;Therapeutic exercise;Balance training;Neuromuscular re-education;Patient/family education;Manual techniques;Passive range of motion;Dry needling;Splinting;Vestibular;Joint Manipulations    PT Next Visit Plan  NMES to L quad with quad sets, review HEP, evaluate stairs, gentle stretching, advance LLE strengthening    PT Home Exercise Plan   Access Code: [email or web address]    Consulted and Agree with Plan of Care  Patient       Patient will benefit from skilled therapeutic intervention in order to improve the following deficits and impairments:  Abnormal gait, Difficulty walking, Decreased strength, Pain  Visit Diagnosis: Acute pain of left knee     Problem List Patient Active Problem List   Diagnosis Date Noted  . Primary osteoarthritis of left knee 03/25/2019  . Multiple pulmonary nodules 09/01/2016  . Cigarette smoker 09/01/2016  . Diabetes ([place]) 05/02/2013  . Primary biliary cirrhosis ([place]) 05/02/2013   Janna Arch, PT, DPT   04/18/2019, 12:51 PM  [place] MAIN [facility] SERVICES [address] [place], [place], [address] Phone: [phone]   Fax:  [phone]  Name: CLARAMAE MCCAY MRN: [id number] Date of Birth: [birth year]

## 2019-04-22 ENCOUNTER — Other Ambulatory Visit: Payer: Self-pay

## 2019-04-22 ENCOUNTER — Ambulatory Visit: Payer: Medicare Other

## 2019-04-22 DIAGNOSIS — M25562 Pain in left knee: Secondary | ICD-10-CM | POA: Diagnosis not present

## 2019-04-22 NOTE — Therapy (Signed)
[place] MAIN [facility] SERVICES [address] [place], [place], [address] Phone: [phone]   Fax:  [phone]  Physical Therapy Treatment  Patient Details  Name: Natasha Franco MRN: [id number] Date of Birth: Dec 14, 1950 Referring Provider (PT): Dr. Rhona Raider   Encounter Date: 04/22/2019  PT End of Session - 04/22/19 1054    Visit Number  3    Number of Visits  25    Date for PT Re-Evaluation  07/08/19    Authorization Type  eval: 04/15/19    PT Start Time  0800    PT Stop Time  0845    PT Time Calculation (min)  45 min    Equipment Utilized During Treatment  Gait belt    Activity Tolerance  Patient tolerated treatment well    Behavior During Therapy  [facility] for tasks assessed/performed       Past Medical History:  Diagnosis Date  . Arthritis   . Complication of anesthesia   . Diabetes mellitus without complication ([place])   . GERD (gastroesophageal reflux disease)   . Migraines   . 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 ABDOMINAL HYSTERECTOMY    . BREAST BIOPSY Left 2014  . BREAST SURGERY Bilateral 1984   reduction mammoplasty  . CARPAL TUNNEL RELEASE    . CESAREAN SECTION    . JOINT REPLACEMENT    . REDUCTION MAMMAPLASTY Bilateral   . ROTATOR CUFF REPAIR    . TONSILLECTOMY    . TOTAL KNEE ARTHROPLASTY Left 03/25/2019   Procedure: Left Knee Arthroplasty;  Surgeon: Melrose Nakayama, MD;  Location: [facility];  Service: Orthopedics;  Laterality: Left;    There were no vitals filed for this visit.  Subjective Assessment - 04/22/19 0806    Subjective  Patient reports she was a little sore after last session, but states that it was a normal sore. No new questions or concerns at this time.    Pertinent History  Pt underwent L TKR 03/25/19 without any reported post-op complications. She had a previous surgery to L knee many years prior for a meniscal cyst. Pt reports no  issues with R knee. Pt had her 10 day follow-up without with orthopedics without any issues. She had 5 visits of [facility] PT before coming to outpatient. She has a CPM machine at home and has been able to increase the range of motion for flexion to 105 degrees. Next orthopedic follow-up appointment is 04/23/19. She has been using her pneumatic compression/ice machine at home as well.    Limitations  Walking    How long can you stand comfortably?  30 minutes    Patient Stated Goals  Decrease pain and improve knee strength    Currently in Pain?  Yes    Pain Score  3     Pain Location  Knee    Pain Orientation  Left    Pain Descriptors / Indicators  Tightness;Heaviness    Pain Type  Surgical pain    Pain Onset  1 to 4 weeks ago    Pain Frequency  Intermittent      TREATMENT     E-stim:  [place] e-stim 10/10 cycle time; burst freq 50 bbs; ramp 2s, duty cycle 50%, treatment time 6min; intensity at 53mA   Therex: Nustep L2, 5 minutes RPM>50 for knee mobility and muscle tissue lengthening for optimal performance during session.   L SLRs  x10  L heel slides with belt x10  L hip abduction x10 cueing for proper technique and to keep toes neutral   L LAQ x20   L knee flexion with red theraband x10 with a 3 sec hold  Mini squats x10   Mini squats with 1/2 foam roll under RLE to increase weight shift to LLE x10  Weight shifting forward and backward with RLE in front of L, rocking forward and backward x10  Wall slides with UEs; cueing to push knees back and to contract gluts x10  B Toe raises with UE support on wall x15   Manual Therapy:  Patella mobilization: inferior, superior, medial, lateral 2 bouts of 30sec in each direction  STM scar massage x6 min   Pt educated throughout session about proper posture and technique with exercises. Improved exercise technique, movement at target joints, use of target muscles after min to mod verbal, visual, tactile cues.   Pt demonstrates  excellent motivation throughout today's session.  She displays difficulty with maintaining proper leg position with sidelying hip abduction, with mod verbal and tactile cueing provided throughout to keep toes neutral and her leg in a neutral position, limiting the hip flexion motion.  Additionally, she demonstrates difficulty with equal weight shifting during mini squats, with a tendency to shift her weight over her RLE.  Some improvement was noted when her RLE was elevated on a 1/2 foam roll, however, compensations are still present.  Pt will continue to benefit from skilled PT services  to address deficits in strength, mobility, and pain in order to return to full function at home with less knee pain.           PT Short Term Goals - 04/15/19 0929      PT SHORT TERM GOAL #1   Title  Pt will be independent with HEP in order to decrease kne pain and increase strength in order to improve pain-free function at home.    Time  4    Period  Weeks    Status  New    Target Date  05/13/19        PT Long Term Goals - 04/15/19 0929      PT LONG TERM GOAL #1   Title  Pt will increase LEFS by at least 9 points in order to demonstrate significant improvement in lower extremity function.    Baseline  04/15/19: 31/80    Time  12    Period  Weeks    Status  New    Target Date  07/08/19      PT LONG TERM GOAL #2   Title  Pt will decrease worst pain as reported on NPRS by at least 3 points in order to demonstrate clinically significant reduction in ankle/foot pain.    Baseline  04/15/19: 8/10    Time  12    Period  Weeks    Status  New    Target Date  07/08/19      PT LONG TERM GOAL #3   Title  Pt will increase strength of L hip abduction and adduction by at least 1/2 MMT grade in order to demonstrate improvement in strength and function    Baseline  04/15/19: 4-/5 for both L hip abduction and adduction    Time  12    Period  Weeks    Status  New    Target Date  07/08/19      PT LONG TERM GOAL  #4  Title  Pt will be able to stand for at least 50 minutes in order to be able to cook in the kitchen without needing to sit and rest    Baseline  04/15/19: 30 minutes max standing time    Time  12    Status  New    Target Date  07/08/19            Plan - 04/22/19 0901    Clinical Impression Statement  Pt demonstrates excellent motivation throughout today's session.  She displays difficulty with maintaining proper leg position with sidelying hip abduction, with mod verbal and tactile cueing provided throughout to keep toes neutral and her leg in a neutral position, limiting the hip flexion motion.  Additionally, she demonstrates difficulty with equal weight shifting during mini squats, with a tendency to shift her weight over her RLE.  Some improvement was noted when her RLE was elevated on a 1/2 foam roll, however, compensations are still present.  Pt will continue to benefit from skilled PT services  to address deficits in strength, mobility, and pain in order to return to full function at home with less knee pain.    Personal Factors and Comorbidities  Age;Comorbidity 1    Comorbidities  DM    Examination-Activity Limitations  Locomotion Level;Squat;Stairs    Examination-Participation Restrictions  Laundry;Shop;Volunteer;Community Activity    Stability/Clinical Decision Making  Stable/Uncomplicated    Rehab Potential  Excellent    PT Frequency  2x / week    PT Duration  12 weeks    PT Treatment/Interventions  ADLs/Self Care Home Management;Aquatic Therapy;Canalith Repostioning;Cryotherapy;Electrical Stimulation;Iontophoresis 4mg /ml Dexamethasone;Traction;Moist Heat;Ultrasound;DME Instruction;Gait training;Functional mobility training;Stair training;Therapeutic activities;Therapeutic exercise;Balance training;Neuromuscular re-education;Patient/family education;Manual techniques;Passive range of motion;Dry needling;Splinting;Vestibular;Joint Manipulations    PT Next Visit Plan  NMES to L  quad with quad sets, review HEP, evaluate stairs, gentle stretching, advance LLE strengthening; active flexion with bloster    PT Home Exercise Plan  Access Code: [email or web address]    Consulted and Agree with Plan of Care  Patient       Patient will benefit from skilled therapeutic intervention in order to improve the following deficits and impairments:  Abnormal gait, Difficulty walking, Decreased strength, Pain  Visit Diagnosis: Acute pain of left knee     Problem List Patient Active Problem List   Diagnosis Date Noted  . Primary osteoarthritis of left knee 03/25/2019  . Multiple pulmonary nodules 09/01/2016  . Cigarette smoker 09/01/2016  . Diabetes ([place]) 05/02/2013  . Primary biliary cirrhosis ([place]) 05/02/2013    This entire session was performed under direct supervision and direction of a licensed therapist/therapist assistant . I have personally read, edited and approve of the note as written.   Lutricia Horsfall, SPT Phillips Grout PT, DPT, GCS  Huprich,Jason 04/22/2019, 3:52 PM  [place] MAIN [facility] SERVICES [address] [place], [place], [address] Phone: [phone]   Fax:  [phone]  Name: NIALANI PRUYN MRN: [id number] Date of Birth: Jun 16, 1951

## 2019-04-24 ENCOUNTER — Ambulatory Visit: Payer: Medicare Other

## 2019-04-24 ENCOUNTER — Other Ambulatory Visit: Payer: Self-pay

## 2019-04-24 DIAGNOSIS — M25562 Pain in left knee: Secondary | ICD-10-CM

## 2019-04-24 NOTE — Therapy (Signed)
[place] MAIN [facility] SERVICES [address] [place], [place], [address] Phone: [phone]   Fax:  [phone]  Physical Therapy Treatment  Patient Details  Name: Natasha Franco MRN: [id number] Date of Birth: 1951-06-08 Referring Provider (PT): Dr. Rhona Raider   Encounter Date: 04/24/2019  PT End of Session - 04/24/19 1236    Visit Number  4    Number of Visits  25    Date for PT Re-Evaluation  07/08/19    Authorization Type  eval: 04/15/19    PT Start Time  0800    PT Stop Time  0846    PT Time Calculation (min)  46 min    Equipment Utilized During Treatment  Gait belt    Activity Tolerance  Patient tolerated treatment well    Behavior During Therapy  [facility] for tasks assessed/performed       Past Medical History:  Diagnosis Date  . Arthritis   . Complication of anesthesia   . Diabetes mellitus without complication ([place])   . GERD (gastroesophageal reflux disease)   . Migraines   . 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 ABDOMINAL HYSTERECTOMY    . BREAST BIOPSY Left 2014  . BREAST SURGERY Bilateral 1984   reduction mammoplasty  . CARPAL TUNNEL RELEASE    . CESAREAN SECTION    . JOINT REPLACEMENT    . REDUCTION MAMMAPLASTY Bilateral   . ROTATOR CUFF REPAIR    . TONSILLECTOMY    . TOTAL KNEE ARTHROPLASTY Left 03/25/2019   Procedure: Left Knee Arthroplasty;  Surgeon: Melrose Nakayama, MD;  Location: [facility];  Service: Orthopedics;  Laterality: Left;    There were no vitals filed for this visit.  Subjective Assessment - 04/24/19 1224    Subjective  Patient reports she was sore yesterday, but states that some of the soreness was due to her being on her feet a lot yesterday.  She reports that she did not sleep well last night, but that she is doing okay today.  No new questions or concerns at this time.    Pertinent History  Pt underwent L TKR 03/25/19 without any  reported post-op complications. She had a previous surgery to L knee many years prior for a meniscal cyst. Pt reports no issues with R knee. Pt had her 10 day follow-up without with orthopedics without any issues. She had 5 visits of [facility] PT before coming to outpatient. She has a CPM machine at home and has been able to increase the range of motion for flexion to 105 degrees. Next orthopedic follow-up appointment is 04/23/19. She has been using her pneumatic compression/ice machine at home as well.    Limitations  Walking    How long can you stand comfortably?  30 minutes    Patient Stated Goals  Decrease pain and improve knee strength    Currently in Pain?  Yes    Pain Score  3     Pain Location  Knee    Pain Orientation  Left    Pain Descriptors / Indicators  Sore;Tightness;Heaviness    Pain Type  Surgical pain    Pain Onset  1 to 4 weeks ago    Pain Frequency  Intermittent       TREATMENT   E-stim:  [place] e-stim 10/10 cycle time; burst freq 50 bbs; ramp 2s, duty cycle 50%, treatment time 16min; intensity at 73mA  with cueing to contract quad while stim contracts  Therex: Nustep L1, 5 minutes RPM>50 for knee mobility and muscle tissue lengthening for optimal performance during session.   Contract-relax into knee extension, stretching into knee flexion with bolster on shin 10 sec hold with a 5 sec rest x6  B hip abduction with red band around ankles 2x10 cueing for proper technique and to keep toes neutral; UE support provided at side of treadmill  Mini squats with 1/2 foam roll under RLE to increase weight shift to LLE 2x10  B Toe raises with UE support on side of treadmill  2x20  Ice massage over anterior knee 6 min       Pt educated throughout session about proper posture and technique with exercises. Improved exercise technique, movement at target joints, use of target muscles after min to mod verbal, visual, tactile cues.          PT Education -  04/25/19 1237    Education Details  exercise technique, body mechanics    Person(s) Educated  Patient    Methods  Explanation;Demonstration;Tactile cues;Verbal cues    Comprehension  Verbalized understanding;Returned demonstration;Verbal cues required;Tactile cues required       PT Short Term Goals - 04/15/19 0929      PT SHORT TERM GOAL #1   Title  Pt will be independent with HEP in order to decrease kne pain and increase strength in order to improve pain-free function at home.    Time  4    Period  Weeks    Status  New    Target Date  05/13/19        PT Long Term Goals - 04/15/19 0929      PT LONG TERM GOAL #1   Title  Pt will increase LEFS by at least 9 points in order to demonstrate significant improvement in lower extremity function.    Baseline  04/15/19: 31/80    Time  12    Period  Weeks    Status  New    Target Date  07/08/19      PT LONG TERM GOAL #2   Title  Pt will decrease worst pain as reported on NPRS by at least 3 points in order to demonstrate clinically significant reduction in ankle/foot pain.    Baseline  04/15/19: 8/10    Time  12    Period  Weeks    Status  New    Target Date  07/08/19      PT LONG TERM GOAL #3   Title  Pt will increase strength of L hip abduction and adduction by at least 1/2 MMT grade in order to demonstrate improvement in strength and function    Baseline  04/15/19: 4-/5 for both L hip abduction and adduction    Time  12    Period  Weeks    Status  New    Target Date  07/08/19      PT LONG TERM GOAL #4   Title  Pt will be able to stand for at least 50 minutes in order to be able to cook in the kitchen without needing to sit and rest    Baseline  04/15/19: 30 minutes max standing time    Time  12    Status  New    Target Date  07/08/19            Plan - 04/24/19 1234    Clinical Impression Statement  Pt demonstrates excellent motivation  throughout today's session.  She displays improvement with mini squats today,  demonstrating a more equal weight shift with roughly 60% of her weight on the R and 40% on the L.  She continues to progress in her strength and ROM, showing improvements each visit.  Pt will continue to benefit from skilled PT services  to address deficits in strength, mobility, and pain in order to return to full function at home with less knee pain.    Personal Factors and Comorbidities  Age;Comorbidity 1    Comorbidities  DM    Examination-Activity Limitations  Locomotion Level;Squat;Stairs    Examination-Participation Restrictions  Laundry;Shop;Volunteer;Community Activity    Stability/Clinical Decision Making  Stable/Uncomplicated    Rehab Potential  Excellent    PT Frequency  2x / week    PT Duration  12 weeks    PT Treatment/Interventions  ADLs/Self Care Home Management;Aquatic Therapy;Canalith Repostioning;Cryotherapy;Electrical Stimulation;Iontophoresis 4mg /ml Dexamethasone;Traction;Moist Heat;Ultrasound;DME Instruction;Gait training;Functional mobility training;Stair training;Therapeutic activities;Therapeutic exercise;Balance training;Neuromuscular re-education;Patient/family education;Manual techniques;Passive range of motion;Dry needling;Splinting;Vestibular;Joint Manipulations    PT Next Visit Plan  NMES to L quad with quad sets, review HEP, evaluate stairs, gentle stretching, advance LLE strengthening; active flexion with bloster    PT Home Exercise Plan  Access Code: [email or web address]    Consulted and Agree with Plan of Care  Patient       Patient will benefit from skilled therapeutic intervention in order to improve the following deficits and impairments:  Abnormal gait, Difficulty walking, Decreased strength, Pain  Visit Diagnosis: Acute pain of left knee     Problem List Patient Active Problem List   Diagnosis Date Noted  . Primary osteoarthritis of left knee 03/25/2019  . Multiple pulmonary nodules 09/01/2016  . Cigarette smoker 09/01/2016  . Diabetes ([place]) 05/02/2013  .  Primary biliary cirrhosis ([place]) 05/02/2013     Lutricia Horsfall, SPT  This entire session was performed under direct supervision and direction of a licensed therapist/therapist assistant . I have personally read, edited and approve of the note as written.  Janna Arch, PT, DPT   04/25/2019, 12:40 PM  [place] MAIN [facility] SERVICES [address] [place], [place], [address] Phone: [phone]   Fax:  [phone]  Name: SILENA CORTEZ MRN: [id number] Date of Birth: [birth year]

## 2019-04-29 ENCOUNTER — Ambulatory Visit: Payer: Medicare Other

## 2019-04-29 ENCOUNTER — Other Ambulatory Visit: Payer: Self-pay

## 2019-04-29 DIAGNOSIS — M25562 Pain in left knee: Secondary | ICD-10-CM

## 2019-04-29 NOTE — Therapy (Signed)
[place] MAIN [facility] SERVICES [address] [place], [place], [address] Phone: [phone]   Fax:  [phone]  Physical Therapy Treatment  Patient Details  Name: Natasha Franco MRN: [id number] Date of Birth: 1951-06-19 Referring Provider (PT): Dr. Rhona Raider   Encounter Date: 04/29/2019  PT End of Session - 04/29/19 0857    Visit Number  5    Number of Visits  25    Date for PT Re-Evaluation  07/08/19    Authorization Type  eval: 04/15/19    PT Start Time  0803    PT Stop Time  0851    PT Time Calculation (min)  48 min    Equipment Utilized During Treatment  Gait belt    Activity Tolerance  Patient tolerated treatment well    Behavior During Therapy  [facility] for tasks assessed/performed       Past Medical History:  Diagnosis Date  . Arthritis   . Complication of anesthesia   . Diabetes mellitus without complication ([place])   . GERD (gastroesophageal reflux disease)   . Migraines   . 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 ABDOMINAL HYSTERECTOMY    . BREAST BIOPSY Left 2014  . BREAST SURGERY Bilateral 1984   reduction mammoplasty  . CARPAL TUNNEL RELEASE    . CESAREAN SECTION    . JOINT REPLACEMENT    . REDUCTION MAMMAPLASTY Bilateral   . ROTATOR CUFF REPAIR    . TONSILLECTOMY    . TOTAL KNEE ARTHROPLASTY Left 03/25/2019   Procedure: Left Knee Arthroplasty;  Surgeon: Melrose Nakayama, MD;  Location: [facility];  Service: Orthopedics;  Laterality: Left;    There were no vitals filed for this visit.  Subjective Assessment - 04/29/19 0806    Subjective  Pt reports that she is doing okay, but she is sore today.  She states that she went to a wedding this weekend that required her to walk up a lot of steps, that she equates to the soreness.  No new questions or concerns at this time.    Pertinent History  Pt underwent L TKR 03/25/19 without any reported post-op  complications. She had a previous surgery to L knee many years prior for a meniscal cyst. Pt reports no issues with R knee. Pt had her 10 day follow-up without with orthopedics without any issues. She had 5 visits of [facility] PT before coming to outpatient. She has a CPM machine at home and has been able to increase the range of motion for flexion to 105 degrees. Next orthopedic follow-up appointment is 04/23/19. She has been using her pneumatic compression/ice machine at home as well.    Limitations  Walking    How long can you stand comfortably?  30 minutes    Patient Stated Goals  Decrease pain and improve knee strength    Currently in Pain?  Yes    Pain Score  3     Pain Location  Knee    Pain Orientation  Left    Pain Descriptors / Indicators  Sore;Tightness;Heaviness    Pain Type  Surgical pain    Pain Onset  1 to 4 weeks ago          TREATMENT   E-stim: [place] e-stim 10/50 cycle time; burst freq 50 bbs; ramp 2s, duty cycle 50%, treatment time 17min; intensity at 49mA with cueing to contract quad while stim contracts  Therex: X Ride L1,60minutes   Mini squat walks along edge of therapy mat x5 in each direction  Contract-relax into knee extension, stretching into knee flexion with bolster on shin 10 sec hold with a 5 sec rest x10  Quantum Leg press LLE 45# 2x10  Quantum Calf Raises LLE 45# 2x10  B hip abduction with red band around ankles 2x10 cueing for proper technique and to keep toes neutral; UE support provided at side of treadmill  Ice massage over anterior knee during [place] stim 10 min    Pt educated throughout session about proper posture and technique with exercises. Improved exercise technique, movement at target joints, use of target muscles after min to mod verbal, visual, tactile cues.   Pt demonstrates excellent motivation throughout today's session.  She demonstrates improvement with technique on hip abduction and mini squat walks today.  She  displays good technique with leg press today, and responded well to the initiation of leg press and calf raises on the Quantum machine today.  Pt will continue to benefit from skilled PT services  to address deficits in strength, mobility, and pain in order to return to full function at home with less knee pain.                         PT Short Term Goals - 04/15/19 0929      PT SHORT TERM GOAL #1   Title  Pt will be independent with HEP in order to decrease kne pain and increase strength in order to improve pain-free function at home.    Time  4    Period  Weeks    Status  New    Target Date  05/13/19        PT Long Term Goals - 04/15/19 0929      PT LONG TERM GOAL #1   Title  Pt will increase LEFS by at least 9 points in order to demonstrate significant improvement in lower extremity function.    Baseline  04/15/19: 31/80    Time  12    Period  Weeks    Status  New    Target Date  07/08/19      PT LONG TERM GOAL #2   Title  Pt will decrease worst pain as reported on NPRS by at least 3 points in order to demonstrate clinically significant reduction in ankle/foot pain.    Baseline  04/15/19: 8/10    Time  12    Period  Weeks    Status  New    Target Date  07/08/19      PT LONG TERM GOAL #3   Title  Pt will increase strength of L hip abduction and adduction by at least 1/2 MMT grade in order to demonstrate improvement in strength and function    Baseline  04/15/19: 4-/5 for both L hip abduction and adduction    Time  12    Period  Weeks    Status  New    Target Date  07/08/19      PT LONG TERM GOAL #4   Title  Pt will be able to stand for at least 50 minutes in order to be able to cook in the kitchen without needing to sit and rest    Baseline  04/15/19: 30 minutes max standing time    Time  12    Status  New    Target Date  07/08/19  Plan - 04/29/19 0856    Clinical Impression Statement  Pt demonstrates excellent motivation throughout  today's session.  She demonstrates improvement with technique on hip abduction and mini squat walks today.  She displays good technique with leg press today, and responded well to the initiation of leg press and calf raises on the Quantum machine today.  Pt will continue to benefit from skilled PT services  to address deficits in strength, mobility, and pain in order to return to full function at home with less knee pain.    Personal Factors and Comorbidities  Age;Comorbidity 1    Comorbidities  DM    Examination-Activity Limitations  Locomotion Level;Squat;Stairs    Examination-Participation Restrictions  Laundry;Shop;Volunteer;Community Activity    Stability/Clinical Decision Making  Stable/Uncomplicated    Rehab Potential  Excellent    PT Frequency  2x / week    PT Duration  12 weeks    PT Treatment/Interventions  ADLs/Self Care Home Management;Aquatic Therapy;Canalith Repostioning;Cryotherapy;Electrical Stimulation;Iontophoresis 4mg /ml Dexamethasone;Traction;Moist Heat;Ultrasound;DME Instruction;Gait training;Functional mobility training;Stair training;Therapeutic activities;Therapeutic exercise;Balance training;Neuromuscular re-education;Patient/family education;Manual techniques;Passive range of motion;Dry needling;Splinting;Vestibular;Joint Manipulations    PT Next Visit Plan  NMES to L quad with quad sets, review HEP, evaluate stairs, gentle stretching, advance LLE strengthening; active flexion with bloster    PT Home Exercise Plan  Access Code: [email or web address]    Consulted and Agree with Plan of Care  Patient       Patient will benefit from skilled therapeutic intervention in order to improve the following deficits and impairments:  Abnormal gait, Difficulty walking, Decreased strength, Pain  Visit Diagnosis: Acute pain of left knee     Problem List Patient Active Problem List   Diagnosis Date Noted  . Primary osteoarthritis of left knee 03/25/2019  . Multiple pulmonary nodules  09/01/2016  . Cigarette smoker 09/01/2016  . Diabetes ([place]) 05/02/2013  . Primary biliary cirrhosis ([place]) 05/02/2013    This entire session was performed under direct supervision and direction of a licensed therapist/therapist assistant . I have personally read, edited and approve of the note as written.   Lutricia Horsfall, SPT [facility] Grout PT, DPT, GCS  Huprich,Jason 04/30/2019, 11:23 AM  [place] MAIN [facility] SERVICES [address] [place], [place], [address] Phone: [phone]   Fax:  [phone]  Name: Natasha Franco MRN: [id number] Date of Birth: 1950-12-23

## 2019-05-02 ENCOUNTER — Ambulatory Visit: Payer: Medicare Other

## 2019-05-02 ENCOUNTER — Other Ambulatory Visit: Payer: Self-pay

## 2019-05-02 DIAGNOSIS — M25562 Pain in left knee: Secondary | ICD-10-CM

## 2019-05-02 NOTE — Therapy (Signed)
[place] MAIN [facility] SERVICES [address] [place], [place], [address] Phone: [phone]   Fax:  [phone]  Physical Therapy Treatment  Patient Details  Name: Natasha Franco MRN: [id number] Date of Birth: 05/16/1951 Referring Provider (PT): Dr. Rhona Raider   Encounter Date: 05/02/2019  PT End of Session - 05/02/19 1045    Visit Number  6    Number of Visits  25    Date for PT Re-Evaluation  07/08/19    Authorization Type  eval: 04/15/19    PT Start Time  0945    PT Stop Time  1029    PT Time Calculation (min)  44 min    Equipment Utilized During Treatment  Gait belt    Activity Tolerance  Patient tolerated treatment well    Behavior During Therapy  [facility] for tasks assessed/performed       Past Medical History:  Diagnosis Date  . Arthritis   . Complication of anesthesia   . Diabetes mellitus without complication ([place])   . GERD (gastroesophageal reflux disease)   . Migraines   . 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 ABDOMINAL HYSTERECTOMY    . BREAST BIOPSY Left 2014  . BREAST SURGERY Bilateral 1984   reduction mammoplasty  . CARPAL TUNNEL RELEASE    . CESAREAN SECTION    . JOINT REPLACEMENT    . REDUCTION MAMMAPLASTY Bilateral   . ROTATOR CUFF REPAIR    . TONSILLECTOMY    . TOTAL KNEE ARTHROPLASTY Left 03/25/2019   Procedure: Left Knee Arthroplasty;  Surgeon: Melrose Nakayama, MD;  Location: [facility];  Service: Orthopedics;  Laterality: Left;    There were no vitals filed for this visit.  Subjective Assessment - 05/02/19 0948    Subjective  Patient reports her leg is fatiguing quickly, pain is not bad, mainly just stiff/sore. No falls or LOB since last session. [place] going [place] the [place] to [place]. The house down there has 18 steps.    Pertinent History  Pt underwent L TKR 03/25/19 without any reported post-op complications. She had a previous surgery to L knee  many years prior for a meniscal cyst. Pt reports no issues with R knee. Pt had her 10 day follow-up without with orthopedics without any issues. She had 5 visits of [facility] PT before coming to outpatient. She has a CPM machine at home and has been able to increase the range of motion for flexion to 105 degrees. Next orthopedic follow-up appointment is 04/23/19. She has been using her pneumatic compression/ice machine at home as well.    Limitations  Walking    How long can you stand comfortably?  30 minutes    Patient Stated Goals  Decrease pain and improve knee strength    Currently in Pain?  Yes    Pain Score  3     Pain Orientation  Left    Pain Descriptors / Indicators  Sore    Pain Type  Surgical pain    Pain Onset  1 to 4 weeks ago    Pain Frequency  Intermittent          Treatment:  Nustep L2, 4 minutes RPM>50 for knee mobility and muscle tissue lengthening for optimal performance during session.    Supine:  -roller to L quadriceps and IT band for pain/tightness reduction, patient "ticklish" at first, improved when able to perform herself with "  stick" in sitting.  -patella mobilization: inferior, superior, medial, lateral: limited  inferiorly; focused mobilization 10x 8 seconds superior/inferior 5x 8 seconds med/lat  -ice cup massage (end of session) 3 minutes: education on performance at home if desired; heat on quadriceps for reduction of pain    Seated: LAQ with increasing hold times to 3 second holds, cueing for neutral alignment and muscular activation LLE 15x  contract relax knee flexion into knee flexion hold 8 seconds, x 15 with varying arc of motion  Standing: Stair stretch: knee flexion 30 seconds, knee extension 30 seconds each LE Stair negotiation: performs with step to pattern with ascending leading with good, descending leading with bad, SUE support; patient has narrow BOS, educated on widening BOS and neutralizing LE position for reduction of symptoms and improved  posture Sit to stand with dynadisc under RLE 10x for weight shift onto LLE   Pt educated throughout session about proper posture and technique with exercises. Improved exercise technique, movement at target joints, use of target muscles after min to mod verbal, visual, tactile cues.    .                   PT Education - 05/02/19 1035    Education Details  stair negotiation, stair stretch, body mechanics    Person(s) Educated  Patient    Methods  Explanation;Demonstration;Tactile cues;Verbal cues    Comprehension  Verbalized understanding;Returned demonstration;Verbal cues required;Tactile cues required       PT Short Term Goals - 04/15/19 0929      PT SHORT TERM GOAL #1   Title  Pt will be independent with HEP in order to decrease kne pain and increase strength in order to improve pain-free function at home.    Time  4    Period  Weeks    Status  New    Target Date  05/13/19        PT Long Term Goals - 04/15/19 0929      PT LONG TERM GOAL #1   Title  Pt will increase LEFS by at least 9 points in order to demonstrate significant improvement in lower extremity function.    Baseline  04/15/19: 31/80    Time  12    Period  Weeks    Status  New    Target Date  07/08/19      PT LONG TERM GOAL #2   Title  Pt will decrease worst pain as reported on NPRS by at least 3 points in order to demonstrate clinically significant reduction in ankle/foot pain.    Baseline  04/15/19: 8/10    Time  12    Period  Weeks    Status  New    Target Date  07/08/19      PT LONG TERM GOAL #3   Title  Pt will increase strength of L hip abduction and adduction by at least 1/2 MMT grade in order to demonstrate improvement in strength and function    Baseline  04/15/19: 4-/5 for both L hip abduction and adduction    Time  12    Period  Weeks    Status  New    Target Date  07/08/19      PT LONG TERM GOAL #4   Title  Pt will be able to stand for at least 50 minutes in order to be  able to cook in the kitchen without needing to sit and rest    Baseline  04/15/19: 30  minutes max standing time    Time  12    Status  New    Target Date  07/08/19            Plan - 05/02/19 1151    Clinical Impression Statement  Patient educated on safe stair negotiation for carryover to beach home and demonstrated understanding. Noticeable quadriceps and IT band tension noted and improved with combined use of roller and patellar mobilizations. Pt will continue to benefit from skilled PT services to address deficits in strength, mobility, and pain in order to return to full function at home with less knee pain    Personal Factors and Comorbidities  Age;Comorbidity 1    Comorbidities  DM    Examination-Activity Limitations  Locomotion Level;Squat;Stairs    Examination-Participation Restrictions  Laundry;Shop;Volunteer;Community Activity    Stability/Clinical Decision Making  Stable/Uncomplicated    Rehab Potential  Excellent    PT Frequency  2x / week    PT Duration  12 weeks    PT Treatment/Interventions  ADLs/Self Care Home Management;Aquatic Therapy;Canalith Repostioning;Cryotherapy;Electrical Stimulation;Iontophoresis 4mg /ml Dexamethasone;Traction;Moist Heat;Ultrasound;DME Instruction;Gait training;Functional mobility training;Stair training;Therapeutic activities;Therapeutic exercise;Balance training;Neuromuscular re-education;Patient/family education;Manual techniques;Passive range of motion;Dry needling;Splinting;Vestibular;Joint Manipulations    PT Next Visit Plan  NMES to L quad with quad sets, review HEP, evaluate stairs, gentle stretching, advance LLE strengthening; active flexion with bloster    PT Home Exercise Plan  Access Code: [email or web address]    Consulted and Agree with Plan of Care  Patient       Patient will benefit from skilled therapeutic intervention in order to improve the following deficits and impairments:  Abnormal gait, Difficulty walking, Decreased strength,  Pain  Visit Diagnosis: Acute pain of left knee     Problem List Patient Active Problem List   Diagnosis Date Noted  . Primary osteoarthritis of left knee 03/25/2019  . Multiple pulmonary nodules 09/01/2016  . Cigarette smoker 09/01/2016  . Diabetes ([place]) 05/02/2013  . Primary biliary cirrhosis ([place]) 05/02/2013   Janna Arch, PT, DPT   05/02/2019, 11:53 AM  [place] MAIN [facility] SERVICES [address] [place], [place], [address] Phone: [phone]   Fax:  [phone]  Name: TITIANNA NOLDE MRN: [id number] Date of Birth: [birth year]

## 2019-05-06 ENCOUNTER — Ambulatory Visit: Payer: Medicare Other

## 2019-05-06 ENCOUNTER — Other Ambulatory Visit: Payer: Self-pay

## 2019-05-06 DIAGNOSIS — M25562 Pain in left knee: Secondary | ICD-10-CM | POA: Diagnosis not present

## 2019-05-06 NOTE — Therapy (Signed)
[place] MAIN [facility] SERVICES [address] [place], [place], [address] Phone: [phone]   Fax:  [phone]  Physical Therapy Treatment  Patient Details  Name: Natasha Franco MRN: [id number] Date of Birth: [birth year] Referring Provider (PT): Dr. Rhona Raider   Encounter Date: 05/06/2019  PT End of Session - 05/06/19 0759    Visit Number  7    Number of Visits  25    Date for PT Re-Evaluation  07/08/19    Authorization Type  eval: 04/15/19    PT Start Time  0800    PT Stop Time  0900    PT Time Calculation (min)  60 min    Equipment Utilized During Treatment  Gait belt    Activity Tolerance  Patient tolerated treatment well    Behavior During Therapy  [facility] for tasks assessed/performed       Past Medical History:  Diagnosis Date  . Arthritis   . Complication of anesthesia   . Diabetes mellitus without complication ([place])   . GERD (gastroesophageal reflux disease)   . Migraines   . 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 ABDOMINAL HYSTERECTOMY    . BREAST BIOPSY Left 2014  . BREAST SURGERY Bilateral 1984   reduction mammoplasty  . CARPAL TUNNEL RELEASE    . CESAREAN SECTION    . JOINT REPLACEMENT    . REDUCTION MAMMAPLASTY Bilateral   . ROTATOR CUFF REPAIR    . TONSILLECTOMY    . TOTAL KNEE ARTHROPLASTY Left 03/25/2019   Procedure: Left Knee Arthroplasty;  Surgeon: Melrose Nakayama, MD;  Location: [facility];  Service: Orthopedics;  Laterality: Left;    There were no vitals filed for this visit.  Subjective Assessment - 05/06/19 0806    Subjective  Patient reports that she feels like her scar is adhereing in certain spots, but otherwise has no complaints about things being difficult at home.  She reports that she feels a sunburn type sensation medially and laterally on her knee, describing an irritation type feeling when things touch it.  No new questions or  concerns at this time.    Pertinent History  Pt underwent L TKR 03/25/19 without any reported post-op complications. She had a previous surgery to L knee many years prior for a meniscal cyst. Pt reports no issues with R knee. Pt had her 10 day follow-up without with orthopedics without any issues. She had 5 visits of [facility] PT before coming to outpatient. She has a CPM machine at home and has been able to increase the range of motion for flexion to 105 degrees. Next orthopedic follow-up appointment is 04/23/19. She has been using her pneumatic compression/ice machine at home as well.    Limitations  Walking    How long can you stand comfortably?  30 minutes    Patient Stated Goals  Decrease pain and improve knee strength    Currently in Pain?  No/denies    Pain Onset  --          TREATMENT   E-stim: [place] e-stim 10/50 cycle time; burst freq 50 bbs; ramp 2s, duty cycle 50%, treatment time 37min; intensity at 72mAwith cueing to contract quad while stim contracts  Therex: X-ride 5 min L1 for warm up during history   Mini squat lateral walks along edge of therapy mat with red band around forefeet x5 in each direction  Contract-relax into  knee extension, stretching into knee flexion with bolster on shin 5 sec hold with a 5 sec rest x10  Quantum L Leg press  45# 1x20; 60# 1x10  [place] R Leg press 60# 2x10  Quantum L Calf Raises LLE 60# 2x10  Quantum R Calf Raises RLE 60# 2x10  Stair stretches to promote both flexion and extension 3x30s each   Manual Therapy: AP and PA knee joint mobs of tibia on femur with knee at end range extension and knee at 90 deg flexion 4 sets of 20-30sec bouts of grade II-III joint mobs  Patella mobilization:superior, inferior, medial, lateral grade II-III joint mobs with several bouts each of 20-30sec.  Cross friction scar massage ~5 min   Pt educated throughout session about proper posture and technique with exercises. Improved exercise  technique, movement at target joints, use of target muscles after min to mod verbal, visual, tactile cues.    Pt demonstrates excellent motivation throughout today's session.  She responded well to the progression of added weight on the leg press today, displaying good technique throughout.  She was introduced to a flexion knee stretch on the steps today, demonstrating good understanding and return of demonstration.  AP and PA knee joint mobs were added in today to assist with increasing flexion and extension ROM. Pt will continue to benefit from skilled PT services to address deficits in strength, mobility, and pain in order to return to full function at home with less knee pain.                         PT Short Term Goals - 04/15/19 0929      PT SHORT TERM GOAL #1   Title  Pt will be independent with HEP in order to decrease kne pain and increase strength in order to improve pain-free function at home.    Time  4    Period  Weeks    Status  New    Target Date  05/13/19        PT Long Term Goals - 04/15/19 0929      PT LONG TERM GOAL #1   Title  Pt will increase LEFS by at least 9 points in order to demonstrate significant improvement in lower extremity function.    Baseline  04/15/19: 31/80    Time  12    Period  Weeks    Status  New    Target Date  07/08/19      PT LONG TERM GOAL #2   Title  Pt will decrease worst pain as reported on NPRS by at least 3 points in order to demonstrate clinically significant reduction in ankle/foot pain.    Baseline  04/15/19: 8/10    Time  12    Period  Weeks    Status  New    Target Date  07/08/19      PT LONG TERM GOAL #3   Title  Pt will increase strength of L hip abduction and adduction by at least 1/2 MMT grade in order to demonstrate improvement in strength and function    Baseline  04/15/19: 4-/5 for both L hip abduction and adduction    Time  12    Period  Weeks    Status  New    Target Date  07/08/19      PT  LONG TERM GOAL #4   Title  Pt will be able to stand for at least 50 minutes in  order to be able to cook in the kitchen without needing to sit and rest    Baseline  04/15/19: 30 minutes max standing time    Time  12    Status  New    Target Date  07/08/19            Plan - 05/06/19 1207    Clinical Impression Statement  Pt demonstrates excellent motivation throughout today's session.  She responded well to the progression of added weight on the leg press today, displaying good technique throughout.  She was introduced to a flexion knee stretch on the steps today, demonstrating good understanding and return of demonstration.  AP and PA knee joint mobs were added in today to assist with increasing flexion and extension ROM.  Pt will continue to benefit from skilled PT services  to address deficits in strength, mobility, and pain in order to return to full function at home with less knee pain.    Personal Factors and Comorbidities  Age;Comorbidity 1    Comorbidities  DM    Examination-Activity Limitations  Locomotion Level;Squat;Stairs    Examination-Participation Restrictions  Laundry;Shop;Volunteer;Community Activity    Stability/Clinical Decision Making  Stable/Uncomplicated    Rehab Potential  Excellent    PT Frequency  2x / week    PT Duration  12 weeks    PT Treatment/Interventions  ADLs/Self Care Home Management;Aquatic Therapy;Canalith Repostioning;Cryotherapy;Electrical Stimulation;Iontophoresis 4mg /ml Dexamethasone;Traction;Moist Heat;Ultrasound;DME Instruction;Gait training;Functional mobility training;Stair training;Therapeutic activities;Therapeutic exercise;Balance training;Neuromuscular re-education;Patient/family education;Manual techniques;Passive range of motion;Dry needling;Splinting;Vestibular;Joint Manipulations    PT Next Visit Plan  NMES to L quad with quad sets, review HEP, evaluate stairs, gentle stretching, advance LLE strengthening; active flexion with bloster    PT  Home Exercise Plan  Access Code: [email or web address]    Consulted and Agree with Plan of Care  Patient       Patient will benefit from skilled therapeutic intervention in order to improve the following deficits and impairments:  Abnormal gait, Difficulty walking, Decreased strength, Pain  Visit Diagnosis: Acute pain of left knee     Problem List Patient Active Problem List   Diagnosis Date Noted  . Primary osteoarthritis of left knee 03/25/2019  . Multiple pulmonary nodules 09/01/2016  . Cigarette smoker 09/01/2016  . Diabetes ([place]) 05/02/2013  . Primary biliary cirrhosis ([place]) 05/02/2013    This entire session was performed under direct supervision and direction of a licensed therapist/therapist assistant . I have personally read, edited and approve of the note as written.   Lutricia Horsfall, SPT [facility] PT, DPT, GCS  Huprich,Jason 05/06/2019, 2:08 PM  [place] MAIN [facility] SERVICES [address] [place], [place], [address] Phone: [phone]   Fax:  [phone]  Name: Natasha Franco MRN: [id number] Date of Birth: 1950/09/28

## 2019-05-08 ENCOUNTER — Other Ambulatory Visit: Payer: Self-pay

## 2019-05-08 ENCOUNTER — Ambulatory Visit: Payer: Medicare Other

## 2019-05-08 DIAGNOSIS — M25562 Pain in left knee: Secondary | ICD-10-CM | POA: Diagnosis not present

## 2019-05-08 NOTE — Therapy (Signed)
[place] MAIN [facility] SERVICES [address] [place], [place], [address] Phone: [phone]   Fax:  [phone]  Physical Therapy Treatment  Patient Details  Name: Natasha Franco MRN: [id number] Date of Birth: 02-21-1951 Referring Provider (PT): Dr. Rhona Raider   Encounter Date: 05/08/2019  PT End of Session - 05/08/19 0800    Visit Number  8    Number of Visits  25    Date for PT Re-Evaluation  07/08/19    Authorization Type  eval: 04/15/19    PT Start Time  0800    PT Stop Time  0845    PT Time Calculation (min)  45 min    Equipment Utilized During Treatment  Gait belt    Activity Tolerance  Patient tolerated treatment well    Behavior During Therapy  [facility] for tasks assessed/performed       Past Medical History:  Diagnosis Date  . Arthritis   . Complication of anesthesia   . Diabetes mellitus without complication ([place])   . GERD (gastroesophageal reflux disease)   . Migraines   . 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 ABDOMINAL HYSTERECTOMY    . BREAST BIOPSY Left 2014  . BREAST SURGERY Bilateral 1984   reduction mammoplasty  . CARPAL TUNNEL RELEASE    . CESAREAN SECTION    . JOINT REPLACEMENT    . REDUCTION MAMMAPLASTY Bilateral   . ROTATOR CUFF REPAIR    . TONSILLECTOMY    . TOTAL KNEE ARTHROPLASTY Left 03/25/2019   Procedure: Left Knee Arthroplasty;  Surgeon: Melrose Nakayama, MD;  Location: [facility];  Service: Orthopedics;  Laterality: Left;    There were no vitals filed for this visit.  Subjective Assessment - 05/08/19 0805    Subjective  Patient reports that her knee was pretty sore for the rest of the day after last session, but that it has felt fine yesterday and today.  No new questions or concerns at this time.    Pertinent History  Pt underwent L TKR 03/25/19 without any reported post-op complications. She had a previous surgery to L knee many  years prior for a meniscal cyst. Pt reports no issues with R knee. Pt had her 10 day follow-up without with orthopedics without any issues. She had 5 visits of [facility] PT before coming to outpatient. She has a CPM machine at home and has been able to increase the range of motion for flexion to 105 degrees. Next orthopedic follow-up appointment is 04/23/19. She has been using her pneumatic compression/ice machine at home as well.    Limitations  Walking    How long can you stand comfortably?  30 minutes    Patient Stated Goals  Decrease pain and improve knee strength    Currently in Pain?  No/denies        TREATMENT   E-stim: NMES e-stim on EMPI Contiunuum utilizing the large muscle atrophy setting at 10/10cycle time; intensity of 39mA, treatment time 29min; cueing to contract quad while stim contracts   Therex: X-ride 5 min L1 for warm up during history   Mini squat lateral walks along edge of therapy mat with red band around forefeet x5 in each direction  Squats with 8# DBs 2x10  TKEs with green band 2x10   STSs with RLE on dynadisc to promote increase weight shift onto L 2x10  Contract-relax into knee extension, stretching into  knee flexion with bolster on shin 5 sec hold with a 5 sec rest x10  L flexion: 119 deg, L extension 3 deg  Lateral step downs from 1st step 2x10 with BUE support and difficulty with eccentric control.  Cueing for performing it slow and controlled   Quantum L Leg press  60# 2x10  Qauntum R Leg press 60# 2x10  Quantum L Calf Raises LLE 60# 2x10  Quantum R Calf Raises RLE 60# 2x10   Manual Therapy: AP and PA knee joint mobs of tibia on femur with knee at end range extension and knee at 90 deg flexion 4 sets of 20-30sec bouts of grade II-III joint mobs   Pt educated throughout session about proper posture and technique with exercises. Improved exercise technique, movement at target joints, use of target muscles after min to mod verbal,  visual, tactile cues.   Pt demonstrated excellent motivation throughout today's session.  She displays difficulty with eccentric control on lateral step downs, requiring BUE support throughout to assist.  She is able to gain 119 deg of flexion today and 3 deg of extension.  She continues to respond well to progression of exercises, demonstrating good technique throughout. Pt will continue to benefit from skilled PT services to address deficits in strength, mobility, and pain in order to return to full function at home with less knee pain.                         PT Short Term Goals - 04/15/19 0929      PT SHORT TERM GOAL #1   Title  Pt will be independent with HEP in order to decrease kne pain and increase strength in order to improve pain-free function at home.    Time  4    Period  Weeks    Status  New    Target Date  05/13/19        PT Long Term Goals - 04/15/19 0929      PT LONG TERM GOAL #1   Title  Pt will increase LEFS by at least 9 points in order to demonstrate significant improvement in lower extremity function.    Baseline  04/15/19: 31/80    Time  12    Period  Weeks    Status  New    Target Date  07/08/19      PT LONG TERM GOAL #2   Title  Pt will decrease worst pain as reported on NPRS by at least 3 points in order to demonstrate clinically significant reduction in ankle/foot pain.    Baseline  04/15/19: 8/10    Time  12    Period  Weeks    Status  New    Target Date  07/08/19      PT LONG TERM GOAL #3   Title  Pt will increase strength of L hip abduction and adduction by at least 1/2 MMT grade in order to demonstrate improvement in strength and function    Baseline  04/15/19: 4-/5 for both L hip abduction and adduction    Time  12    Period  Weeks    Status  New    Target Date  07/08/19      PT LONG TERM GOAL #4   Title  Pt will be able to stand for at least 50 minutes in order to be able to cook in the kitchen without needing to sit  and rest  Baseline  04/15/19: 30 minutes max standing time    Time  12    Status  New    Target Date  07/08/19            Plan - 05/08/19 0859    Clinical Impression Statement  Pt demonstrated excellent motivation throughout today's session.  She displays difficulty with eccentric control on lateral step downs, requiring BUE support throughout to assist.  She is able to gain 119 deg of flexion today and 3 deg of extension.  She continues to respond well to progression of exercises, demonstrating good technique throughout. Pt will continue to benefit from skilled PT services  to address deficits in strength, mobility, and pain in order to return to full function at home with less knee pain.    Personal Factors and Comorbidities  Age;Comorbidity 1    Comorbidities  DM    Examination-Activity Limitations  Locomotion Level;Squat;Stairs    Examination-Participation Restrictions  Laundry;Shop;Volunteer;Community Activity    Stability/Clinical Decision Making  Stable/Uncomplicated    Rehab Potential  Excellent    PT Frequency  2x / week    PT Duration  12 weeks    PT Treatment/Interventions  ADLs/Self Care Home Management;Aquatic Therapy;Canalith Repostioning;Cryotherapy;Electrical Stimulation;Iontophoresis 4mg /ml Dexamethasone;Traction;Moist Heat;Ultrasound;DME Instruction;Gait training;Functional mobility training;Stair training;Therapeutic activities;Therapeutic exercise;Balance training;Neuromuscular re-education;Patient/family education;Manual techniques;Passive range of motion;Dry needling;Splinting;Vestibular;Joint Manipulations    PT Next Visit Plan  NMES to L quad with quad sets, review HEP, evaluate stairs, gentle stretching, advance LLE strengthening; active flexion with bloster    PT Home Exercise Plan  Access Code: [email or web address]    Consulted and Agree with Plan of Care  Patient       Patient will benefit from skilled therapeutic intervention in order to improve the following deficits  and impairments:  Abnormal gait, Difficulty walking, Decreased strength, Pain  Visit Diagnosis: Acute pain of left knee     Problem List Patient Active Problem List   Diagnosis Date Noted  . Primary osteoarthritis of left knee 03/25/2019  . Multiple pulmonary nodules 09/01/2016  . Cigarette smoker 09/01/2016  . Diabetes ([place]) 05/02/2013  . Primary biliary cirrhosis ([place]) 05/02/2013   This entire session was performed under direct supervision and direction of a licensed therapist/therapist assistant . I have personally read, edited and approve of the note as written.   Lutricia Horsfall, SPT [facility]out PT, DPT, GCS  Huprich,Jason 05/08/2019, 5:32 PM  [place] MAIN [facility] SERVICES [address] [place], [place], [address] Phone: [phone]   Fax:  [phone]  Name: Natasha Franco MRN: [id number] Date of Birth: Mar 25, 1951

## 2019-05-12 ENCOUNTER — Other Ambulatory Visit: Payer: Self-pay

## 2019-05-12 ENCOUNTER — Ambulatory Visit: Payer: Medicare Other | Attending: Orthopaedic Surgery

## 2019-05-12 DIAGNOSIS — M6281 Muscle weakness (generalized): Secondary | ICD-10-CM | POA: Insufficient documentation

## 2019-05-12 DIAGNOSIS — M25562 Pain in left knee: Secondary | ICD-10-CM

## 2019-05-12 NOTE — Therapy (Signed)
[place] [address] [place], [place], [address] Phone: [phone]   Fax:  [phone]  Physical Therapy Treatment  Patient Details  Name: Natasha Franco MRN: [id number] Date of Birth: 07-14-1950 Referring Provider (PT): Dr. Rhona Raider   Encounter Date: 05/12/2019  PT End of Session - 05/12/19 0911    Visit Number  9    Number of Visits  25    Date for PT Re-Evaluation  07/08/19    Authorization Type  eval: 04/15/19    PT Start Time  0850    PT Stop Time  0950    PT Time Calculation (min)  60 min    Equipment Utilized During Treatment  Gait belt    Activity Tolerance  Patient tolerated treatment well    Behavior During Therapy  [facility] for tasks assessed/performed       Past Medical History:  Diagnosis Date  . Arthritis   . Complication of anesthesia   . Diabetes mellitus without complication ([place])   . GERD (gastroesophageal reflux disease)   . Migraines   . 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 ABDOMINAL HYSTERECTOMY    . BREAST BIOPSY Left 2014  . BREAST SURGERY Bilateral 1984   reduction mammoplasty  . CARPAL TUNNEL RELEASE    . CESAREAN SECTION    . JOINT REPLACEMENT    . REDUCTION MAMMAPLASTY Bilateral   . ROTATOR CUFF REPAIR    . TONSILLECTOMY    . TOTAL KNEE ARTHROPLASTY Left 03/25/2019   Procedure: Left Knee Arthroplasty;  Surgeon: Melrose Nakayama, MD;  Location: [facility];  Service: Orthopedics;  Laterality: Left;    There were no vitals filed for this visit.  Subjective Assessment - 05/12/19 0852    Subjective  Patient reports that her knee was pretty sore and stiff the last few days.  She reports that she woke up with it throbbing and pulling today.  She reports increase stiffness today as well, reporting 6/10 soreness/stiffness.    Pertinent History  Pt underwent L TKR 03/25/19 without any reported post-op complications. She  had a previous surgery to L knee many years prior for a meniscal cyst. Pt reports no issues with R knee. Pt had her 10 day follow-up without with orthopedics without any issues. She had 5 visits of [facility] PT before coming to outpatient. She has a CPM machine at home and has been able to increase the range of motion for flexion to 105 degrees. Next orthopedic follow-up appointment is 04/23/19. She has been using her pneumatic compression/ice machine at home as well.    Limitations  Walking    How long can you stand comfortably?  30 minutes    Patient Stated Goals  Decrease pain and improve knee strength    Currently in Pain?  Yes    Pain Score  6     Pain Location  Knee    Pain Orientation  Left    Pain Descriptors / Indicators  Sore    Pain Type  Surgical pain       TREATMENT   E-stim, unbilled TENS e-stim on EMPI Contiunuum utilizing the knee setting at 41mA for 30 min during exercises today to assist with pain control    Therex: NuStep 5 min L1 for warm up during history  Mini squatlateralwalks along edge of therapy mat x5 in each direction   4 way  hip with green band  2x10; cueing to perform slow and controll   Squats with 8# DBs 2x12; 2nd set performed with RLE on dynadisc to promote increase weight shift to the LLE  TKEs with green band 2x10   Lateral step downs from 1st step 2x12 without support and cueing to focus on eccentric control.  STSs with RLE on dynadisc to promote increase weight shift onto L 2x12  Contract-relax into knee extension, stretching into knee flexion with bolster on shin5sec hold with a 5 sec rest x10   QuantumLLeg press 60# 2x12  Qauntum R Leg press 60# 2x12  QuantumLCalf Raises LLE60# 2x12  Quantum R Calf Raises RLE 60# 2x12  Towel slides x15 with a 5 sec hold utilizing stretching strap    Manual Therapy: AP and PA knee joint mobs of tibia on femur with knee in sitting with knee at 90 deg 5 sets of 20-30sec bouts  of grade III-IV joint mobs  Ice massage during knee extension with heel under bolster and a 4# CW on thigh.  The CW became intolerable after 4 min       Pt educated throughout session about proper posture and technique with exercises. Improved exercise technique, movement at target joints, use of target muscles after min to mod verbal, visual, tactile cues.   Pt demonstrated good motivation throughout today's session.  She reports increased stiffness and soreness upon arrival today.  A TENS unit was utilized throughout today's session, in an effort to decrease her pain and soreness throughout the session, reporting a relief in symptoms during our session.  She demonstrated improved eccentric control and balance when performing lateral step downs today.  AP and PA grade III/IV joint mobs were performed in a seated position today in an effort to increase knee flexion and extension.   Pt will continue to benefit from skilled PT services to address deficits in strength, mobility, and pain in order to return to full function at home with less knee pain.                          PT Short Term Goals - 04/15/19 0929      PT SHORT TERM GOAL #1   Title  Pt will be independent with HEP in order to decrease kne pain and increase strength in order to improve pain-free function at home.    Time  4    Period  Weeks    Status  New    Target Date  05/13/19        PT Long Term Goals - 04/15/19 0929      PT LONG TERM GOAL #1   Title  Pt will increase LEFS by at least 9 points in order to demonstrate significant improvement in lower extremity function.    Baseline  04/15/19: 31/80    Time  12    Period  Weeks    Status  New    Target Date  07/08/19      PT LONG TERM GOAL #2   Title  Pt will decrease worst pain as reported on NPRS by at least 3 points in order to demonstrate clinically significant reduction in ankle/foot pain.    Baseline  04/15/19: 8/10    Time  12     Period  Weeks    Status  New    Target Date  07/08/19      PT LONG TERM GOAL #3   Title  Pt will increase strength of L hip abduction and adduction by at least 1/2 MMT grade in order to demonstrate improvement in strength and function    Baseline  04/15/19: 4-/5 for both L hip abduction and adduction    Time  12    Period  Weeks    Status  New    Target Date  07/08/19      PT LONG TERM GOAL #4   Title  Pt will be able to stand for at least 50 minutes in order to be able to cook in the kitchen without needing to sit and rest    Baseline  04/15/19: 30 minutes max standing time    Time  12    Status  New    Target Date  07/08/19            Plan - 05/12/19 1247    Clinical Impression Statement  Pt demonstrated good motivation throughout today's session.  She reports increased stiffness and soreness upon arrival today.  A TENS unit was utilized throughout today's session, in an effort to decrease her pain and soreness throughout the session, reporting a relief in symptoms during our session.  She demonstrated improved eccentric control and balance when performing lateral step downs today.  AP and PA grade III/IV joint mobs were performed in a seated position today in an effort to increase knee flexion and extension.   Pt will continue to benefit from skilled PT services  to address deficits in strength, mobility, and pain in order to return to full function at home with less knee pain.    Personal Factors and Comorbidities  Age;Comorbidity 1    Comorbidities  DM    Examination-Activity Limitations  Locomotion Level;Squat;Stairs    Examination-Participation Restrictions  Laundry;Shop;Volunteer;Community Activity    Stability/Clinical Decision Making  Stable/Uncomplicated    Rehab Potential  Excellent    PT Frequency  2x / week    PT Duration  12 weeks    PT Treatment/Interventions  ADLs/Self Care Home Management;Aquatic Therapy;Canalith Repostioning;Cryotherapy;Electrical  Stimulation;Iontophoresis 4mg /ml Dexamethasone;Traction;Moist Heat;Ultrasound;DME Instruction;Gait training;Functional mobility training;Stair training;Therapeutic activities;Therapeutic exercise;Balance training;Neuromuscular re-education;Patient/family education;Manual techniques;Passive range of motion;Dry needling;Splinting;Vestibular;Joint Manipulations    PT Next Visit Plan  Progress note next session.  NMES to L quad with quad sets, review HEP, evaluate stairs, gentle stretching, advance LLE strengthening; active flexion with bloster    PT Home Exercise Plan  Access Code: [email or web address]    Consulted and Agree with Plan of Care  Patient       Patient will benefit from skilled therapeutic intervention in order to improve the following deficits and impairments:  Abnormal gait, Difficulty walking, Decreased strength, Pain  Visit Diagnosis: Acute pain of left knee     Problem List Patient Active Problem List   Diagnosis Date Noted  . Primary osteoarthritis of left knee 03/25/2019  . Multiple pulmonary nodules 09/01/2016  . Cigarette smoker 09/01/2016  . Diabetes ([place]) 05/02/2013  . Primary biliary cirrhosis ([place]) 05/02/2013    This entire session was performed under direct supervision and direction of a licensed therapist/therapist assistant . I have personally read, edited and approve of the note as written.   Lutricia Horsfall, SPT [facility] PT, DPT, GCS  Huprich,Jason 05/12/2019, 4:24 PM  [place] MAIN [facility] SERVICES [address] [place], [place], [address] Phone: [phone]   Fax:  [phone]  Name: WANELL [place] MRN: [id number] Date of Birth: May 21, 1951

## 2019-05-15 ENCOUNTER — Other Ambulatory Visit: Payer: Self-pay

## 2019-05-15 ENCOUNTER — Ambulatory Visit: Payer: Medicare Other

## 2019-05-15 DIAGNOSIS — M25562 Pain in left knee: Secondary | ICD-10-CM

## 2019-05-15 NOTE — Therapy (Signed)
[place] MAIN [facility] SERVICES [address] [place], [place], [address] Phone: [phone]   Fax:  [phone]  Physical Therapy Progress Note   Dates of reporting period 04/15/19  to   05/15/19   Patient Details  Name: Natasha Franco MRN: [id number] Date of Birth: Mar 24, 1951 Referring Provider (PT): Dr. Rhona Raider   Encounter Date: 05/15/2019  PT End of Session - 05/15/19 0933    Visit Number  10    Number of Visits  25    Date for PT Re-Evaluation  07/08/19    Authorization Type  eval: 04/15/19    PT Start Time  0845    PT Stop Time  0937    PT Time Calculation (min)  52 min    Equipment Utilized During Treatment  Gait belt    Activity Tolerance  Patient tolerated treatment well    Behavior During Therapy  [facility] for tasks assessed/performed       Past Medical History:  Diagnosis Date  . Arthritis   . Complication of anesthesia   . Diabetes mellitus without complication ([place])   . GERD (gastroesophageal reflux disease)   . Migraines   . 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 ABDOMINAL HYSTERECTOMY    . BREAST BIOPSY Left 2014  . BREAST SURGERY Bilateral 1984   reduction mammoplasty  . CARPAL TUNNEL RELEASE    . CESAREAN SECTION    . JOINT REPLACEMENT    . REDUCTION MAMMAPLASTY Bilateral   . ROTATOR CUFF REPAIR    . TONSILLECTOMY    . TOTAL KNEE ARTHROPLASTY Left 03/25/2019   Procedure: Left Knee Arthroplasty;  Surgeon: Melrose Nakayama, MD;  Location: [facility];  Service: Orthopedics;  Laterality: Left;    There were no vitals filed for this visit.  Subjective Assessment - 05/15/19 0854    Subjective  Patient reports that her scar feels tight today.  She reports that she is getting some nerve sensation back, with some "zinging" pain in the anteromedial and lateral knee.    Pertinent History  Pt underwent L TKR 03/25/19 without any reported post-op  complications. She had a previous surgery to L knee many years prior for a meniscal cyst. Pt reports no issues with R knee. Pt had her 10 day follow-up without with orthopedics without any issues. She had 5 visits of [facility] PT before coming to outpatient. She has a CPM machine at home and has been able to increase the range of motion for flexion to 105 degrees. Next orthopedic follow-up appointment is 04/23/19. She has been using her pneumatic compression/ice machine at home as well.    Limitations  Walking    How long can you stand comfortably?  30 minutes    Patient Stated Goals  Decrease pain and improve knee strength    Currently in Pain?  Yes    Pain Score  2     Pain Location  Knee    Pain Orientation  Left    Pain Descriptors / Indicators  Tightness    Pain Type  Surgical pain    Pain Onset  More than a month ago    Pain Frequency  Intermittent         TREATMENT   Therex: NuStep 5 min L1 for warm up during history  ROM reassessed: L flexion - 118 deg L extension lacking 3 deg   Mini squatlateralwalks with  red band around ankles along edge of therapy mat x5 in each direction   Squats on airex 2x12 without UE support  TKEs with green band 2x10   Lateral step downs from 1st step 2x12 without UE support and cueing to focus on eccentric control.  Contract-relax into knee extension, stretching into knee flexion with bolster on shin5sec hold with a 5 sec rest x10  QuantumLLeg press 65#2x15  Qauntum R Leg press 65# 2x15  QuantumLCalf Raises LLE65# 2x15  Quantum R Calf Raises RLE 65# 2x15  Towel slides x15 with a 5 sec hold utilizing stretching strap    Manual Therapy: AP and PA knee joint mobs of tibia on femur with knee at end range extension 5 sets of 20-30sec bouts of grade III-IV joint mobs  AP joint mobs with knee at end range flexion 4 sets of 20-30s bouts of grade III-IV joint mobs  Patella mobilization: inferior, superior,  medial, lateral 2 bouts of 30sec in each direction  Scar mobilization- cross friction and longitudinal techniques to loosen adhesions 5 min.       Pt educated throughout session about proper posture and technique with exercises. Improved exercise technique, movement at target joints, use of target muscles after min to mod verbal, visual, tactile cues.   Pt continues to demonstrate excellent motivation throughout her PT sessions.  She has met 1 STG and 2 LTGs to date.  Her LEFS has improved from a 31/80 to a 44/80 today and her worst pain indicated on the NPRS scale has improved from an 8/10 to a 5/10.  Additionally she reports that she was able to tolerate standing for 1.5 hours to vote this week, indicating this was the longest that she has stood since her surgery.  Her L hip abduction strength has improved from a 4-/5 to a 4+/5 today.  Her L knee flexion has improved from 108 deg to 118 deg, while her extension has improved from 5 deg to 3 deg.  Pt will continue to benefit from skilled PT services to address deficits in strength, mobility, and pain in order to return to full function at home with less knee pain.        PT Short Term Goals - 05/15/19 1241      PT SHORT TERM GOAL #1   Title  Pt will be independent with HEP in order to decrease kne pain and increase strength in order to improve pain-free function at home.    Time  4    Period  Weeks    Status  Achieved    Target Date  05/13/19        PT Long Term Goals - 05/15/19 0931      PT LONG TERM GOAL #1   Title  Pt will increase LEFS to >60/80 in order to demonstrate significant improvement in lower extremity function.    Baseline  04/15/19: 31/80; 05/15/2019: 44/80    Time  12    Period  Weeks    Status  Revised    Target Date  07/08/19      PT LONG TERM GOAL #2   Title  Pt will decrease worst pain as reported on NPRS by at least 3 points in order to demonstrate clinically significant reduction in  ankle/foot pain.    Baseline  04/15/19: 8/10; 05/15/19: 5/10    Time  12    Period  Weeks    Status  Partially Met    Target Date  07/08/19  PT LONG TERM GOAL #3   Title  Pt will increase strength of L hip abduction and adduction by at least 1/2 MMT grade in order to demonstrate improvement in strength and function    Baseline  04/15/19: 4-/5 for both L hip abduction and adduction; 05/15/19: 4-/5 for hip adduction, 4+/5 hip abduction    Time  12    Period  Weeks    Status  Partially Met    Target Date  07/08/19      PT LONG TERM GOAL #4   Title  Pt will be able to stand for at least 50 minutes in order to be able to cook in the kitchen without needing to sit and rest    Baseline  04/15/19: 30 minutes max standing time; 05/15/19: stood for an hour and a half waiting to vote    Time  12    Status  Achieved      PT LONG TERM GOAL #5   Title  Pt will increase her L knee AROM to within 5 deg of her R knee ROM in order to improve functional mobility.    Baseline  04/15/19: R/L flexion = 135/108, R/L extension = -5 (hyperextension)/5; 05/15/19: R/L flexion = 135/118, R/L extension  = -5 (hyperextension/3    Time  12    Period  Weeks    Status  New    Target Date  07/08/19            Plan - 05/15/19 1240    Clinical Impression Statement  Pt continues to demonstrate excellent motivation throughout her PT sessions.  She has met 1 STG and 2 LTGs to date.  Her LEFS has improved from a 31/80 to a 44/80 today and her worst pain indicated on the NPRS scale has improved from an 8/10 to a 5/10.  Additionally she reports that she was able to tolerate standing for 1.5 hours to vote this week, indicating this was the longest that she has stood since her surgery.  Her L hip abduction strength has improved from a 4-/5 to a 4+/5 today.  Her L knee flexion has improved from 108 deg to 118 deg, while her extension has improved from 5 deg to 3 deg.  Pt will continue to benefit from skilled PT services  to  address deficits in strength, mobility, and pain in order to return to full function at home with less knee pain.    Personal Factors and Comorbidities  Age;Comorbidity 1    Comorbidities  DM    Examination-Activity Limitations  Locomotion Level;Squat;Stairs    Examination-Participation Restrictions  Laundry;Shop;Volunteer;Community Activity    Stability/Clinical Decision Making  Stable/Uncomplicated    Rehab Potential  Excellent    PT Frequency  2x / week    PT Duration  12 weeks    PT Treatment/Interventions  ADLs/Self Care Home Management;Aquatic Therapy;Canalith Repostioning;Cryotherapy;Electrical Stimulation;Iontophoresis 51m/ml Dexamethasone;Traction;Moist Heat;Ultrasound;DME Instruction;Gait training;Functional mobility training;Stair training;Therapeutic activities;Therapeutic exercise;Balance training;Neuromuscular re-education;Patient/family education;Manual techniques;Passive range of motion;Dry needling;Splinting;Vestibular;Joint Manipulations    PT Next Visit Plan  NMES to L quad with quad sets, review HEP, evaluate stairs, gentle stretching, advance LLE strengthening; active flexion with bloster    PT Home Exercise Plan  Access Code: [email or web address]   Consulted and Agree with Plan of Care  Patient       Patient will benefit from skilled therapeutic intervention in order to improve the following deficits and impairments:  Abnormal gait, Difficulty walking, Decreased strength, Pain  Visit Diagnosis: Acute pain  of left knee     Problem List Patient Active Problem List   Diagnosis Date Noted  . Primary osteoarthritis of left knee 03/25/2019  . Multiple pulmonary nodules 09/01/2016  . Cigarette smoker 09/01/2016  . Diabetes ([place]) 05/02/2013  . Primary biliary cirrhosis ([place]) 05/02/2013    This entire session was performed under direct supervision and direction of a licensed therapist/therapist assistant . I have personally read, edited and approve of the note as written.    Lutricia Horsfall, SPT [facility] PT, DPT, GCS  Huprich,Jason 05/15/2019, 5:19 PM  [place] MAIN [facility] SERVICES [address] [place], [place], [address] Phone: [phone]   Fax:  [phone]  Name: JAMYRAH SAUR MRN: [id number] Date of Birth: 1951-06-18

## 2019-05-20 ENCOUNTER — Other Ambulatory Visit: Payer: Self-pay

## 2019-05-20 ENCOUNTER — Ambulatory Visit: Payer: Medicare Other

## 2019-05-20 DIAGNOSIS — M25562 Pain in left knee: Secondary | ICD-10-CM

## 2019-05-20 NOTE — Therapy (Signed)
[place] MAIN [facility] SERVICES [address] [place], [place], [address] Phone: [phone]   Fax:  [phone]  Physical Therapy Treatment  Patient Details  Name: Natasha Franco MRN: [id number] Date of Birth: 05/20/1951 Referring Provider (PT): Dr. Rhona Raider   Encounter Date: 05/20/2019  PT End of Session - 05/20/19 0814    Visit Number  11    Number of Visits  25    Date for PT Re-Evaluation  07/08/19    Authorization Type  eval: 04/15/19    PT Start Time  0803    PT Stop Time  0845    PT Time Calculation (min)  42 min    Equipment Utilized During Treatment  Gait belt    Activity Tolerance  Patient tolerated treatment well    Behavior During Therapy  [facility] for tasks assessed/performed       Past Medical History:  Diagnosis Date  . Arthritis   . Complication of anesthesia   . Diabetes mellitus without complication ([place])   . GERD (gastroesophageal reflux disease)   . Migraines   . 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 ABDOMINAL HYSTERECTOMY    . BREAST BIOPSY Left 2014  . BREAST SURGERY Bilateral 1984   reduction mammoplasty  . CARPAL TUNNEL RELEASE    . CESAREAN SECTION    . JOINT REPLACEMENT    . REDUCTION MAMMAPLASTY Bilateral   . ROTATOR CUFF REPAIR    . TONSILLECTOMY    . TOTAL KNEE ARTHROPLASTY Left 03/25/2019   Procedure: Left Knee Arthroplasty;  Surgeon: Melrose Nakayama, MD;  Location: [facility];  Service: Orthopedics;  Laterality: Left;    There were no vitals filed for this visit.  Subjective Assessment - 05/20/19 0805    Subjective  Patient reports that she has her generalized knee stiffness today.  She reports that she felt better after scar massage and joint mobs last session.  She still reports a "zinging" sensation in her knee again this week, stating that she feels like her sensation is coming back.    Pertinent History  Pt underwent L TKR  03/25/19 without any reported post-op complications. She had a previous surgery to L knee many years prior for a meniscal cyst. Pt reports no issues with R knee. Pt had her 10 day follow-up without with orthopedics without any issues. She had 5 visits of [facility] PT before coming to outpatient. She has a CPM machine at home and has been able to increase the range of motion for flexion to 105 degrees. Next orthopedic follow-up appointment is 04/23/19. She has been using her pneumatic compression/ice machine at home as well.    Limitations  Walking    How long can you stand comfortably?  30 minutes    Patient Stated Goals  Decrease pain and improve knee strength    Currently in Pain?  Yes    Pain Score  3     Pain Location  Knee    Pain Descriptors / Indicators  Tightness    Pain Type  Surgical pain    Pain Onset  More than a month ago         TREATMENT   Therex: X-ride5 min L2 for warm up during history  QuantumLLeg press 75#2x12  Qauntum R Leg press 75# 2x12  QuantumLCalf Raises LLE75# 2x12  Quantum R Calf Raises RLE 65# 2x12  Lateral step downs  from 1st step 2x15withoutUE support andcueing to focus oneccentric control.  BUE support used during the last 5 reps of the 2nd set  L Forward step ups onto 1st step 2x10 with BUE support from palms for steadying throughout  L Forward step downs from 1st step 2x10 with BUE support throughout with a focus on eccentric control  Mini squatlateralwalks with red band around ankles along edge of therapy mat x5 in each direction  TKEs with green band 2x10   Contract-relax into knee extension, stretching into knee flexion with bolster on shin5sec hold with a 5 sec rest x10    Manual Therapy: AP and PA knee joint mobs of tibia on femur with knee at end range extension 4sets of 20-30sec bouts of grade III-IVjoint mobs  AP joint mobs with knee at end range flexion 4 sets of 20-30s bouts of grade III-IV joint  mobs  Scar mobilization- cross friction and longitudinal techniques to loosen adhesions 5 min.      Pt educated throughout session about proper posture and technique with exercises. Improved exercise technique, movement at target joints, use of target muscles after min to mod verbal, visual, tactile cues.   Pt continues to demonstrateexcellentmotivation throughout her PT sessions.  She continues to be able to progress her weight and reps on all exercises, as well as the ability to successfully complete more difficult exercises each session.  She responded well to the reintroduction of forward step ups and step downs, demonstrating good eccentric control throughout.  She continues to respond well to joint mobilizations and scar mobilization, reporting that she feels that her mobility is improving each day.  Pt will continue to benefit from skilled PT services to address deficits in strength, mobility, and pain in order to return to full function at home with less knee pain.                        PT Short Term Goals - 05/15/19 1241      PT SHORT TERM GOAL #1   Title  Pt will be independent with HEP in order to decrease kne pain and increase strength in order to improve pain-free function at home.    Time  4    Period  Weeks    Status  Achieved    Target Date  05/13/19        PT Long Term Goals - 05/15/19 0931      PT LONG TERM GOAL #1   Title  Pt will increase LEFS to >60/80 in order to demonstrate significant improvement in lower extremity function.    Baseline  04/15/19: 31/80; 05/15/2019: 44/80    Time  12    Period  Weeks    Status  Revised    Target Date  07/08/19      PT LONG TERM GOAL #2   Title  Pt will decrease worst pain as reported on NPRS by at least 3 points in order to demonstrate clinically significant reduction in ankle/foot pain.    Baseline  04/15/19: 8/10; 05/15/19: 5/10    Time  12    Period  Weeks    Status  Partially Met     Target Date  07/08/19      PT LONG TERM GOAL #3   Title  Pt will increase strength of L hip abduction and adduction by at least 1/2 MMT grade in order to demonstrate improvement in strength and function    Baseline  04/15/19: 4-/5 for both L hip abduction and adduction; 05/15/19: 4-/5 for hip adduction, 4+/5 hip abduction    Time  12    Period  Weeks    Status  Partially Met    Target Date  07/08/19      PT LONG TERM GOAL #4   Title  Pt will be able to stand for at least 50 minutes in order to be able to cook in the kitchen without needing to sit and rest    Baseline  04/15/19: 30 minutes max standing time; 05/15/19: stood for an hour and a half waiting to vote    Time  12    Status  Achieved      PT LONG TERM GOAL #5   Title  Pt will increase her L knee AROM to within 5 deg of her R knee ROM in order to improve functional mobility.    Baseline  04/15/19: R/L flexion = 135/108, R/L extension = -5 (hyperextension)/5; 05/15/19: R/L flexion = 135/118, R/L extension  = -5 (hyperextension/3    Time  12    Period  Weeks    Status  New    Target Date  07/08/19            Plan - 05/20/19 1246    Clinical Impression Statement  Pt continues to demonstrate excellent motivation throughout her PT sessions.  She continues to be able to progress her weight and reps on all exercises, as well as the ability to successfully complete more difficult exercises each session.  She responded well to the reintroduction of forward step ups and step downs, demonstrating good eccentric control throughout.  She continues to respond well to joint mobilizations and scar mobilization, reporting that she feels that her mobility is improving each day.  Pt will continue to benefit from skilled PT services  to address deficits in strength, mobility, and pain in order to return to full function at home with less knee pain.    Personal Factors and Comorbidities  Age;Comorbidity 1    Comorbidities  DM    Examination-Activity  Limitations  Locomotion Level;Squat;Stairs    Examination-Participation Restrictions  Laundry;Shop;Volunteer;Community Activity    Stability/Clinical Decision Making  Stable/Uncomplicated    Rehab Potential  Excellent    PT Frequency  2x / week    PT Duration  12 weeks    PT Treatment/Interventions  ADLs/Self Care Home Management;Aquatic Therapy;Canalith Repostioning;Cryotherapy;Electrical Stimulation;Iontophoresis 75m/ml Dexamethasone;Traction;Moist Heat;Ultrasound;DME Instruction;Gait training;Functional mobility training;Stair training;Therapeutic activities;Therapeutic exercise;Balance training;Neuromuscular re-education;Patient/family education;Manual techniques;Passive range of motion;Dry needling;Splinting;Vestibular;Joint Manipulations    PT Next Visit Plan  continue to progres strength and balance in LLE; joint mobs and scar mobilization to promote increased ROM    PT Home Exercise Plan  Access Code: [email or web address]   Consulted and Agree with Plan of Care  Patient       Patient will benefit from skilled therapeutic intervention in order to improve the following deficits and impairments:  Abnormal gait, Difficulty walking, Decreased strength, Pain  Visit Diagnosis: Acute pain of left knee     Problem List Patient Active Problem List   Diagnosis Date Noted  . Primary osteoarthritis of left knee 03/25/2019  . Multiple pulmonary nodules 09/01/2016  . Cigarette smoker 09/01/2016  . [place] 05/02[place]  . [place] ([place] 05/02/2013    This entire session was performed under direct supervision and direction of a licensed therapist/therapist assistant . I have personally read, edited and approve of the note as written.  Lutricia Horsfall, SPT [facility] PT, DPT, GCS  Natasha Franco,Natasha Franco 05/20/2019, 3:24 PM  [place] MAIN [facility] SERVICES [address] [place], [place], [address] Phone: [phone]   Fax:   [phone]  Name: Natasha Franco MRN: [id number] Date of Birth: 1950/11/25

## 2019-05-22 ENCOUNTER — Ambulatory Visit: Payer: Medicare Other

## 2019-05-22 ENCOUNTER — Other Ambulatory Visit: Payer: Self-pay

## 2019-05-22 DIAGNOSIS — M25562 Pain in left knee: Secondary | ICD-10-CM

## 2019-05-22 NOTE — Therapy (Signed)
[place] MAIN [facility] SERVICES [address] [place], [place], [address] Phone: [phone]   Fax:  [phone]  Physical Therapy Treatment  Patient Details  Name: Natasha Franco MRN: [id number] Date of Birth: Dec 07, 1950 Referring Provider (PT): Dr. Rhona Raider   Encounter Date: 05/22/2019  PT End of Session - 05/22/19 0954    Visit Number  12    Number of Visits  25    Date for PT Re-Evaluation  07/08/19    Authorization Type  eval: 04/15/19    PT Start Time  0845    PT Stop Time  0935    PT Time Calculation (min)  50 min    Activity Tolerance  Patient tolerated treatment well    Behavior During Therapy  [facility] for tasks assessed/performed       Past Medical History:  Diagnosis Date  . Arthritis   . Complication of anesthesia   . Diabetes mellitus without complication ([place])   . GERD (gastroesophageal reflux disease)   . Migraines   . 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 ABDOMINAL HYSTERECTOMY    . BREAST BIOPSY Left 2014  . BREAST SURGERY Bilateral 1984   reduction mammoplasty  . CARPAL TUNNEL RELEASE    . CESAREAN SECTION    . JOINT REPLACEMENT    . REDUCTION MAMMAPLASTY Bilateral   . ROTATOR CUFF REPAIR    . TONSILLECTOMY    . TOTAL KNEE ARTHROPLASTY Left 03/25/2019   Procedure: Left Knee Arthroplasty;  Surgeon: Melrose Nakayama, MD;  Location: [facility];  Service: Orthopedics;  Laterality: Left;    There were no vitals filed for this visit.  Subjective Assessment - 05/22/19 0854    Subjective  Pt reported that she has been very sore since her last  PT session since upping the weights for the leg press. She stated that she is still sore today. She reported more stiffness with the weather this week as well.    Pertinent History  Pt underwent L TKR 03/25/19 without any reported post-op complications. She had a previous surgery to L knee many years prior for a  meniscal cyst. Pt reports no issues with R knee. Pt had her 10 day follow-up without with orthopedics without any issues. She had 5 visits of [facility] PT before coming to outpatient. She has a CPM machine at home and has been able to increase the range of motion for flexion to 105 degrees. Next orthopedic follow-up appointment is 04/23/19. She has been using her pneumatic compression/ice machine at home as well.    Limitations  Walking    How long can you stand comfortably?  30 minutes    Patient Stated Goals  Decrease pain and improve knee strength    Currently in Pain?  Yes    Pain Score  5     Pain Location  Knee    Pain Orientation  Left    Pain Descriptors / Indicators  Sore    Pain Type  Surgical pain    Pain Onset  More than a month ago    Pain Frequency  Intermittent       TREATMENT   Therex: X-ride5 min L2 for warm up during history  L Contract-relax into knee extension, stretching into knee flexion with bolster on shin5sec hold with a 5 sec rest x10  Mini squatlateralwalkswith red band around anklesalong edge of therapy mat x5  in each direction  L TKEs with green band 2x15  L Seated knee extensions with green band 2x10   STS with RLE on dynadisc 2x10  L SLS on airex pad 3x30s without UE support; CGA provided throughout      Manual Therapy: AP and PA knee joint mobs of tibia on femur with kneeat end range extension4sets of 20-30sec bouts of grade III-IVjoint mobs  AP joint mobs with knee at end range flexion 4 sets of 20-30s bouts of grade III-IV joint mobs  Patella mobilization: inferior, superior, medial, lateral2 bouts of 30sec in each direction  Scar mobilization- cross frictionand longitudinaltechniques to loosen adhesions 5 min.  Pt was also provided eduction on skin desensitization strategies for the sensitivity along her scar.      Pt educated throughout session about proper posture and technique with exercises. Improved  exercise technique, movement at target joints, use of target muscles after min to mod verbal, visual, tactile cues.   Ptcontinues todemonstrateexcellentmotivation throughouther PT sessions.She responded well to the initiation of single leg balance on the airex pad today, demonstrating good control at her ankle and knee.  Pt education was provided today about skin desensitization techniques with rubbing different types of materials along the sensitive areas of her scar and skin to assist in pain/discomfort management. She continues to respond well to joint mobilizations and scar mobilization, reporting that she feels that her mobility is improving each day.  Pt will continue to benefit from skilled PT services to address deficits in strength, mobility, and pain in order to return to full function at home with less knee pain.                           PT Short Term Goals - 05/15/19 1241      PT SHORT TERM GOAL #1   Title  Pt will be independent with HEP in order to decrease kne pain and increase strength in order to improve pain-free function at home.    Time  4    Period  Weeks    Status  Achieved    Target Date  05/13/19        PT Long Term Goals - 05/15/19 0931      PT LONG TERM GOAL #1   Title  Pt will increase LEFS to >60/80 in order to demonstrate significant improvement in lower extremity function.    Baseline  04/15/19: 31/80; 05/15/2019: 44/80    Time  12    Period  Weeks    Status  Revised    Target Date  07/08/19      PT LONG TERM GOAL #2   Title  Pt will decrease worst pain as reported on NPRS by at least 3 points in order to demonstrate clinically significant reduction in ankle/foot pain.    Baseline  04/15/19: 8/10; 05/15/19: 5/10    Time  12    Period  Weeks    Status  Partially Met    Target Date  07/08/19      PT LONG TERM GOAL #3   Title  Pt will increase strength of L hip abduction and adduction by at least 1/2 MMT grade in order  to demonstrate improvement in strength and function    Baseline  04/15/19: 4-/5 for both L hip abduction and adduction; 05/15/19: 4-/5 for hip adduction, 4+/5 hip abduction    Time  12    Period  Weeks  Status  Partially Met    Target Date  07/08/19      PT LONG TERM GOAL #4   Title  Pt will be able to stand for at least 50 minutes in order to be able to cook in the kitchen without needing to sit and rest    Baseline  04/15/19: 30 minutes max standing time; 05/15/19: stood for an hour and a half waiting to vote    Time  12    Status  Achieved      PT LONG TERM GOAL #5   Title  Pt will increase her L knee AROM to within 5 deg of her R knee ROM in order to improve functional mobility.    Baseline  04/15/19: R/L flexion = 135/108, R/L extension = -5 (hyperextension)/5; 05/15/19: R/L flexion = 135/118, R/L extension  = -5 (hyperextension/3    Time  12    Period  Weeks    Status  New    Target Date  07/08/19            Plan - 05/22/19 1001    Clinical Impression Statement  Pt continues to demonstrate excellent motivation throughout her PT sessions.  She responded well to the initiation of single leg balance on the airex pad today, demonstrating good control at her ankle and knee.  Pt education was provided today about skin desensitization techniques with rubbing different types of materials along the sensitive areas of her scar and skin to assist in pain/discomfort management.  She continues to respond well to joint mobilizations and scar mobilization, reporting that she feels that her mobility is improving each day.  Pt will continue to benefit from skilled PT services  to address deficits in strength, mobility, and pain in order to return to full function at home with less knee pain.    Personal Factors and Comorbidities  Age;Comorbidity 1    Comorbidities  DM    Examination-Activity Limitations  Locomotion Level;Squat;Stairs    Examination-Participation Restrictions   Laundry;Shop;Volunteer;Community Activity    Stability/Clinical Decision Making  Stable/Uncomplicated    Rehab Potential  Excellent    PT Frequency  2x / week    PT Duration  12 weeks    PT Treatment/Interventions  ADLs/Self Care Home Management;Aquatic Therapy;Canalith Repostioning;Cryotherapy;Electrical Stimulation;Iontophoresis 50m/ml Dexamethasone;Traction;Moist Heat;Ultrasound;DME Instruction;Gait training;Functional mobility training;Stair training;Therapeutic activities;Therapeutic exercise;Balance training;Neuromuscular re-education;Patient/family education;Manual techniques;Passive range of motion;Dry needling;Splinting;Vestibular;Joint Manipulations    PT Next Visit Plan  continue to progres strength and balance in LLE; joint mobs and scar mobilization to promote increased ROM    PT Home Exercise Plan  Access Code: [email or web address]   Consulted and Agree with Plan of Care  Patient       Patient will benefit from skilled therapeutic intervention in order to improve the following deficits and impairments:  Abnormal gait, Difficulty walking, Decreased strength, Pain  Visit Diagnosis: Acute pain of left knee     Problem List Patient Active Problem List   Diagnosis Date Noted  . Primary osteoarthritis of left knee 03/25/2019  . Multiple pulmonary nodules 09/01/2016  . Cigarette smoker 09/01/2016  . Diabetes ([place] 05/02/2013  . Primary biliary cirrhosis ([place] 05/02/2013     TLutricia Horsfall SPT  TLutricia Horsfall11/06/2019, 10:02 AM  [place]MAIN [facility]SERV[facility] [address] [address]Phone: [phone]  Fax:  [phone] Name: MKIANNA BILLETMRN: [id number] of Birth: 1February 17, 1952

## 2019-05-27 ENCOUNTER — Ambulatory Visit: Payer: Medicare Other

## 2019-05-27 ENCOUNTER — Other Ambulatory Visit: Payer: Self-pay

## 2019-05-27 DIAGNOSIS — M25562 Pain in left knee: Secondary | ICD-10-CM

## 2019-05-27 NOTE — Therapy (Signed)
[place] MAIN [facility] SERVICES [address] [place], [place], [address] Phone: [phone]   Fax:  [phone]  Physical Therapy Treatment  Patient Details  Name: Natasha Franco MRN: [id number] Date of Birth: 1951-05-22 Referring Provider (PT): Dr. Rhona Raider   Encounter Date: 05/27/2019  PT End of Session - 05/27/19 0806    Visit Number  13    Number of Visits  25    Date for PT Re-Evaluation  07/08/19    Authorization Type  eval: 04/15/19    PT Start Time  0800    PT Stop Time  0845    PT Time Calculation (min)  45 min    Activity Tolerance  Patient tolerated treatment well    Behavior During Therapy  [facility] for tasks assessed/performed       Past Medical History:  Diagnosis Date  . Arthritis   . Complication of anesthesia   . Diabetes mellitus without complication ([place])   . GERD (gastroesophageal reflux disease)   . Migraines   . 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 ABDOMINAL HYSTERECTOMY    . BREAST BIOPSY Left 2014  . BREAST SURGERY Bilateral 1984   reduction mammoplasty  . CARPAL TUNNEL RELEASE    . CESAREAN SECTION    . JOINT REPLACEMENT    . REDUCTION MAMMAPLASTY Bilateral   . ROTATOR CUFF REPAIR    . TONSILLECTOMY    . TOTAL KNEE ARTHROPLASTY Left 03/25/2019   Procedure: Left Knee Arthroplasty;  Surgeon: Melrose Nakayama, MD;  [facility];  Service: Orthopedics;  Laterality: Left;    There were no vitals filed for this visit.  Subjective Assessment - 05/27/19 0803    Subjective  Pt reports no pain or soreness today.  She does endorse a mild pinching sensation over her scar/patella.  No new questions or concerns at this time.    Pertinent History  Pt underwent L TKR 03/25/19 without any reported post-op complications. She had a previous surgery to L knee many years prior for a meniscal cyst. Pt reports no issues with R knee. Pt had her 10 day  follow-up without with orthopedics without any issues. She had 5 visits of [facility] PT before coming to outpatient. She has a CPM machine at home and has been able to increase the range of motion for flexion to 105 degrees. Next orthopedic follow-up appointment is 04/23/19. She has been using her pneumatic compression/ice machine at home as well.    Limitations  Walking    How long can you stand comfortably?  30 minutes    Patient Stated Goals  Decrease pain and improve knee strength    Currently in Pain?  No/denies    Pain Onset  --          TREATMENT   Therex: X-ride5 min L55fr warm up during history  QuantumLLeg press 75#2x12  [place] R Leg press 75# 2x12  QuantumLCalf Raises LLE75# 2x12  Quantum R Calf Raises RLE 75# 2x12  Lateral step downs from 1st step 2x15withoutUEsupport andcueing to focus oneccentric control.  BUE support used during the last 5 reps of the 2nd set  L Forward step ups onto 1st step 2x10 with BUE support from palms for steadying throughout  L Contract-relax in prone into knee extension, stretching into knee flexion with bolster on shin5sec hold with a 5 sec rest x10  Mini squatlateralwalkswith red band  around anklesalong edge of therapy mat x5 in each direction  L TKEs with blue band 2x12     Manual Therapy: IASTM to distal quad and proximal scar for 10 min   Patella mobilization: inferior, superior, medial, lateral2 bouts of 30sec in each direction   Knee flexion: 128 deg Knee extension: 3 deg     Pt educated throughout session about proper posture and technique with exercises. Improved exercise technique, movement at target joints, use of target muscles after min to mod verbal, visual, tactile cues.   Ptcontinues todemonstrateexcellentmotivation throughouther PT sessions. She continues to respond well to progression of strength exercises, demonstrating the ability to increase weight,  resistance, and reps each session.  She continues to demonstrate proper technique throughout her exercises.  She reports a decrease in muscle tension and pinching sensation following IASTM and patellar mobilizations today.  She was able to reach 128 deg of knee flexion and 3 deg of knee extension today. Pt will continue to benefit from skilled PT services to address deficits in strength, mobility, and pain in order to return to full function at home with less knee pain.              PT Short Term Goals - 05/15/19 1241      PT SHORT TERM GOAL #1   Title  Pt will be independent with HEP in order to decrease kne pain and increase strength in order to improve pain-free function at home.    Time  4    Period  Weeks    Status  Achieved    Target Date  05/13/19        PT Long Term Goals - 05/15/19 0931      PT LONG TERM GOAL #1   Title  Pt will increase LEFS to >60/80 in order to demonstrate significant improvement in lower extremity function.    Baseline  04/15/19: 31/80; 05/15/2019: 44/80    Time  12    Period  Weeks    Status  Revised    Target Date  07/08/19      PT LONG TERM GOAL #2   Title  Pt will decrease worst pain as reported on NPRS by at least 3 points in order to demonstrate clinically significant reduction in ankle/foot pain.    Baseline  04/15/19: 8/10; 05/15/19: 5/10    Time  12    Period  Weeks    Status  Partially Met    Target Date  07/08/19      PT LONG TERM GOAL #3   Title  Pt will increase strength of L hip abduction and adduction by at least 1/2 MMT grade in order to demonstrate improvement in strength and function    Baseline  04/15/19: 4-/5 for both L hip abduction and adduction; 05/15/19: 4-/5 for hip adduction, 4+/5 hip abduction    Time  12    Period  Weeks    Status  Partially Met    Target Date  07/08/19      PT LONG TERM GOAL #4   Title  Pt will be able to stand for at least 50 minutes in order to be able to cook in the kitchen without needing  to sit and rest    Baseline  04/15/19: 30 minutes max standing time; 05/15/19: stood for an hour and a half waiting to vote    Time  12    Status  Achieved      PT LONG TERM GOAL #  5   Title  Pt will increase her L knee AROM to within 5 deg of her R knee ROM in order to improve functional mobility.    Baseline  04/15/19: R/L flexion = 135/108, R/L extension = -5 (hyperextension)/5; 05/15/19: R/L flexion = 135/118, R/L extension  = -5 (hyperextension/3    Time  12    Period  Weeks    Status  New    Target Date  07/08/19            Plan - 05/27/19 0034    Clinical Impression Statement  Pt continues to demonstrate excellent motivation throughout her PT sessions.  She continues to respond well to progression of strength exercises, demonstrating the ability to increase weight, resistance, and reps each session.  She continues to demonstrate proper technique throughout her exercises.  She reports a decrease in muscle tension and pinching sensation following IASTM and patellar mobilizations today.  She was able to reach 128 deg of knee flexion and 3 deg of knee extension today.  Pt will continue to benefit from skilled PT services  to address deficits in strength, mobility, and pain in order to return to full function at home with less knee pain.    Personal Factors and Comorbidities  Age;Comorbidity 1    Comorbidities  DM    Examination-Activity Limitations  Locomotion Level;Squat;Stairs    Examination-Participation Restrictions  Laundry;Shop;Volunteer;Community Activity    Stability/Clinical Decision Making  Stable/Uncomplicated    Rehab Potential  Excellent    PT Frequency  2x / week    PT Duration  12 weeks    PT Treatment/Interventions  ADLs/Self Care Home Management;Aquatic Therapy;Canalith Repostioning;Cryotherapy;Electrical Stimulation;Iontophoresis 35m/ml Dexamethasone;Traction;Moist Heat;Ultrasound;DME Instruction;Gait training;Functional mobility training;Stair training;Therapeutic  activities;Therapeutic exercise;Balance training;Neuromuscular re-education;Patient/family education;Manual techniques;Passive range of motion;Dry needling;Splinting;Vestibular;Joint Manipulations    PT Next Visit Plan  continue to progres strength and balance in LLE; joint mobs and scar mobilization to promote increased ROM    PT Home Exercise Plan  Access Code: [email or web address]   Consulted and Agree with Plan of Care  Patient       Patient will benefit from skilled therapeutic intervention in order to improve the following deficits and impairments:  Abnormal gait, Difficulty walking, Decreased strength, Pain  Visit Diagnosis: Acute pain of left knee     Problem List Patient Active Problem List   Diagnosis Date Noted  . Primary osteoarthritis of left knee 03/25/2019  . Multiple pulmonary nodules 09/01/2016  . Cigarette smoker 09/01/2016  . Diabetes ([place] 05/02/2013  . Primary biliary cirrhosis ([place] 05/02/2013       TLutricia Horsfall SPT JPhillips GroutPT, DPT, GCS  Huprich,Jason 05/27/2019, 2:58 PM  CM[place]MAIN ROdessa Regional Medical CenterSERVICES [address]RW[address] [place] [address]Phone: [phone]  Fax:  [phone] Name: Natasha WASSELMRN: [id number] of Birth: 1Aug 11, 1952

## 2019-05-29 ENCOUNTER — Other Ambulatory Visit: Payer: Self-pay

## 2019-05-29 ENCOUNTER — Ambulatory Visit: Payer: Medicare Other

## 2019-05-29 DIAGNOSIS — M25562 Pain in left knee: Secondary | ICD-10-CM

## 2019-05-29 NOTE — Therapy (Signed)
[place] MAIN [facility] SERVICES [address] [place], [place], [address] Phone: [phone]   Fax:  [phone]  Physical Therapy Treatment  Patient Details  Name: Natasha Franco MRN: [id number] Date of Birth: 16-Apr-1951 Referring Provider (PT): Dr. Rhona Raider   Encounter Date: 05/29/2019  PT End of Session - 05/29/19 0855    Visit Number  14    Number of Visits  25    Date for PT Re-Evaluation  07/08/19    Authorization Type  eval: 04/15/19    PT Start Time  0845    PT Stop Time  0930    PT Time Calculation (min)  45 min    Activity Tolerance  Patient tolerated treatment well    Behavior During Therapy  [facility] for tasks assessed/performed       Past Medical History:  Diagnosis Date  . Arthritis   . Complication of anesthesia   . Diabetes mellitus without complication ([place])   . GERD (gastroesophageal reflux disease)   . Migraines   . 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 ABDOMINAL HYSTERECTOMY    . BREAST BIOPSY Left 2014  . BREAST SURGERY Bilateral 1984   reduction mammoplasty  . CARPAL TUNNEL RELEASE    . CESAREAN SECTION    . JOINT REPLACEMENT    . REDUCTION MAMMAPLASTY Bilateral   . ROTATOR CUFF REPAIR    . TONSILLECTOMY    . TOTAL KNEE ARTHROPLASTY Left 03/25/2019   Procedure: Left Knee Arthroplasty;  Surgeon: Melrose Nakayama, MD;  Location: [facility];  Service: Orthopedics;  Laterality: Left;    There were no vitals filed for this visit.  Subjective Assessment - 05/29/19 0850    Subjective  Pt reports no pain or soreness today or after last session.  She had a f/u appt with Dr. Rhona Raider yesterday, stating that her appt went well, saying that she is progressing well and ahead of the curve.  No new questions or concerns at this time.    Pertinent History  Pt underwent L TKR 03/25/19 without any reported post-op complications. She had a previous surgery to L  knee many years prior for a meniscal cyst. Pt reports no issues with R knee. Pt had her 10 day follow-up without with orthopedics without any issues. She had 5 visits of [facility] PT before coming to outpatient. She has a CPM machine at home and has been able to increase the range of motion for flexion to 105 degrees. Next orthopedic follow-up appointment is 04/23/19. She has been using her pneumatic compression/ice machine at home as well.    Limitations  Walking    How long can you stand comfortably?  30 minutes    Patient Stated Goals  Decrease pain and improve knee strength    Currently in Pain?  No/denies          Therex: X-ride5 min L2fr warm up during history  QuantumLLeg press 75#2x15  Q[place]um R Leg press75# 2x15  QuantumLCalf Raises LLE75# 2x15  Quantum R Calf Raises RLE 75# 2x15  Lateral step downs from 1st step 2x15withoutUEsupport andcueing to focus oneccentric control.BUE support used during the last 5 reps of the 2nd set  L Forward step ups onto 1st step 2x10 with BUE support from palms for steadying throughout  L Forward step downs front 1st step 2x10 with BUE with red theraband around lateral L knee for resistance provided  Ifor tactile cue to knee alignment and pain control for 2nd set  Mini squatlateralwalkswith green band around anklesalong edge of therapy mat x3 in each direction  L single leg stance on foam 3x30s with intermittent SUE support      Manual Therapy: IASTM to distal quad and proximal scar for 10 min   Patella mobilization: inferior, superior, medial, lateral2 bouts of 20sec in each direction      Pt educated throughout session about proper posture and technique with exercises. Improved exercise technique, movement at target joints, use of target muscles after min to mod verbal, visual, tactile cues.   Ptcontinues todemonstrateexcellentmotivation throughouther PT sessions. She continues to  respond well to progression of strength exercises, demonstrating the ability to increase weight, resistance, and reps each session.  She benefited from tactile feedback at her L lateral knee during step downs today to assist with knee alignment and decrease pain.  She continues to demonstrate proper technique throughout her exercises.  Pt will continue to benefit from skilled PT services to address deficits in strength, mobility, and pain in order to return to full function at home with less knee pain.                        PT Short Term Goals - 05/15/19 1241      PT SHORT TERM GOAL #1   Title  Pt will be independent with HEP in order to decrease kne pain and increase strength in order to improve pain-free function at home.    Time  4    Period  Weeks    Status  Achieved    Target Date  05/13/19        PT Long Term Goals - 05/15/19 0931      PT LONG TERM GOAL #1   Title  Pt will increase LEFS to >60/80 in order to demonstrate significant improvement in lower extremity function.    Baseline  04/15/19: 31/80; 05/15/2019: 44/80    Time  12    Period  Weeks    Status  Revised    Target Date  07/08/19      PT LONG TERM GOAL #2   Title  Pt will decrease worst pain as reported on NPRS by at least 3 points in order to demonstrate clinically significant reduction in ankle/foot pain.    Baseline  04/15/19: 8/10; 05/15/19: 5/10    Time  12    Period  Weeks    Status  Partially Met    Target Date  07/08/19      PT LONG TERM GOAL #3   Title  Pt will increase strength of L hip abduction and adduction by at least 1/2 MMT grade in order to demonstrate improvement in strength and function    Baseline  04/15/19: 4-/5 for both L hip abduction and adduction; 05/15/19: 4-/5 for hip adduction, 4+/5 hip abduction    Time  12    Period  Weeks    Status  Partially Met    Target Date  07/08/19      PT LONG TERM GOAL #4   Title  Pt will be able to stand for at least 50 minutes in  order to be able to cook in the kitchen without needing to sit and rest    Baseline  04/15/19: 30 minutes max standing time; 05/15/19: stood for an hour and a half waiting to vote    Time  12  Status  Achieved      PT LONG TERM GOAL #5   Title  Pt will increase her L knee AROM to within 5 deg of her R knee ROM in order to improve functional mobility.    Baseline  04/15/19: R/L flexion = 135/108, R/L extension = -5 (hyperextension)/5; 05/15/19: R/L flexion = 135/118, R/L extension  = -5 (hyperextension/3    Time  12    Period  Weeks    Status  New    Target Date  07/08/19            Plan - 05/29/19 1033    Clinical Impression Statement  Pt continues to demonstrate excellent motivation throughout her PT sessions.  She continues to respond well to progression of strength exercises, demonstrating the ability to increase weight, resistance, and reps each session.  She benefited from tactile feedback at her L lateral knee during step downs today to assist with knee alignment and decrease pain.  She continues to demonstrate proper technique throughout her exercises.   Pt will continue to benefit from skilled PT services  to address deficits in strength, mobility, and pain in order to return to full function at home with less knee pain.    Personal Factors and Comorbidities  Age;Comorbidity 1    Comorbidities  DM    Examination-Activity Limitations  Locomotion Level;Squat;Stairs    Examination-Participation Restrictions  Laundry;Shop;Volunteer;Community Activity    Stability/Clinical Decision Making  Stable/Uncomplicated    Rehab Potential  Excellent    PT Frequency  2x / week    PT Duration  12 weeks    PT Treatment/Interventions  ADLs/Self Care Home Management;Aquatic Therapy;Canalith Repostioning;Cryotherapy;Electrical Stimulation;Iontophoresis 5m/ml Dexamethasone;Traction;Moist Heat;Ultrasound;DME Instruction;Gait training;Functional mobility training;Stair training;Therapeutic  activities;Therapeutic exercise;Balance training;Neuromuscular re-education;Patient/family education;Manual techniques;Passive range of motion;Dry needling;Splinting;Vestibular;Joint Manipulations    PT Next Visit Plan  continue to progres strength and balance in LLE; joint mobs and scar mobilization to promote increased ROM    PT Home Exercise Plan  Access Code: [email or web address]   Consulted and Agree with Plan of Care  Patient       Patient will benefit from skilled therapeutic intervention in order to improve the following deficits and impairments:  Abnormal gait, Difficulty walking, Decreased strength, Pain  Visit Diagnosis: Acute pain of left knee     Problem List Patient Active Problem List   Diagnosis Date Noted  . Primary osteoarthritis of left knee 03/25/2019  . Multiple pulmonary nodules 09/01/2016  . Cigarette smoker 09/01/2016  . Diabetes ([place] 05/02/2013  . Primary biliary cirrhosis ([place] 05/02/2013     TLutricia Horsfall SPT This entire session was performed under direct supervision and direction of a licensed therapist/therapist assistant . I have personally read, edited and approve of the note as written.  MJanna Arch PT, DPT   05/29/2019, 10:34 AM  [place] [facility]Al[facility]ERVICES [address]RR[address] [place] [address]Phone: [phone]  Fax:  [phone] Name: MHANAE WAITERSMRN: [id number] of Birth: [birth year]

## 2019-06-02 ENCOUNTER — Ambulatory Visit: Payer: Medicare Other

## 2019-06-02 ENCOUNTER — Other Ambulatory Visit: Payer: Self-pay

## 2019-06-02 DIAGNOSIS — M25562 Pain in left knee: Secondary | ICD-10-CM | POA: Diagnosis not present

## 2019-06-02 DIAGNOSIS — M6281 Muscle weakness (generalized): Secondary | ICD-10-CM

## 2019-06-02 NOTE — Therapy (Signed)
[place] MAIN [facility] SERVICES [address] [place], [place], [address] Phone: [phone]   Fax:  [phone]  Physical Therapy Treatment  Patient Details  Name: ALYANAH ELLIOTT MRN: [id number] Date of Birth: 08-28-1950 Referring Provider (PT): Dr. Rhona Raider   Encounter Date: 06/02/2019  PT End of Session - 06/02/19 0809    Visit Number  15    Number of Visits  25    Date for PT Re-Evaluation  07/08/19    Authorization Type  eval: 04/15/19    PT Start Time  0805    PT Stop Time  0850    PT Time Calculation (min)  45 min    Activity Tolerance  Patient tolerated treatment well    Behavior During Therapy  [facility] for tasks assessed/performed       Past Medical History:  Diagnosis Date  . Arthritis   . Complication of anesthesia   . Diabetes mellitus without complication ([place])   . GERD (gastroesophageal reflux disease)   . Migraines   . 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 ABDOMINAL HYSTERECTOMY    . BREAST BIOPSY Left 2014  . BREAST SURGERY Bilateral 1984   reduction mammoplasty  . CARPAL TUNNEL RELEASE    . CESAREAN SECTION    . JOINT REPLACEMENT    . REDUCTION MAMMAPLASTY Bilateral   . ROTATOR CUFF REPAIR    . TONSILLECTOMY    . TOTAL KNEE ARTHROPLASTY Left 03/25/2019   Procedure: Left Knee Arthroplasty;  Surgeon: Melrose Nakayama, MD;  Location: [facility];  Service: Orthopedics;  Laterality: Left;    There were no vitals filed for this visit.  Subjective Assessment - 06/02/19 0808    Subjective  Pt reports no pain or soreness today or after last session. She denies any pain currently but reports continued pulling in the lateral part of her L knee. She reports that she has been more active with walking recently due to grocery/Christmas shopping. No specific questions or concerns at this time.    Pertinent History  Pt underwent L TKR 03/25/19 without any  reported post-op complications. She had a previous surgery to L knee many years prior for a meniscal cyst. Pt reports no issues with R knee. Pt had her 10 day follow-up without with orthopedics without any issues. She had 5 visits of [facility] PT before coming to outpatient. She has a CPM machine at home and has been able to increase the range of motion for flexion to 105 degrees. Next orthopedic follow-up appointment is 04/23/19. She has been using her pneumatic compression/ice machine at home as well.    Limitations  Walking    How long can you stand comfortably?  30 minutes    Patient Stated Goals  Decrease pain and improve knee strength    Currently in Pain?  No/denies        TREATMENT    Therex X-ride5 min L38fr warm up during history gradually progressing seat forward to encourage increased knee flexion; Hooklying LLE SLR x 10, added 3# AW x 10; R sidelying LLE abduction SLR 2 x 10, second set with light manual resistance; Hooklying clams with red tband x 15, increased to manual resistance x 15; Hooklying adductor squeezes with manual resistance 2 x 15; Hooklying bridges with RLE extended x 10; Quantumsingle leg press 90#2x15 bilateral; Quantum heel raises 90# 2 x 15 bilateral;   Manual Therapy:  IASTM to distal quad, especially lateral with effleurage. Also utilized roller to entire quad to decrease muscle tension; Patella mobilization medial and superior grade III, 30s/bout x 3 bouts each;   Pt educated throughout session about proper posture and technique with exercises. Improved exercise technique, movement at target joints, use of target muscles after min to mod verbal, visual, tactile cues.   Ptcontinues todemonstrateexcellentmotivation throughouther PT sessions. She continues to respond well to progression of strength exercises, demonstrating the ability to increase weight again today. She complains of some lateral "pulling" sensations in her distal quad so focused  STM to this area. Also continued with patellar mobilizations for improved extension and to decrease pulling in lateral quad. She continues to demonstrate quad and hip flexor weakness with SLR as well as abductor weakness with sidelying leg lifts. Pt will continue to benefit from skilled PT services to address deficits in strength, mobility, and pain in order to return to full function at home with less knee pain.                       PT Short Term Goals - 05/15/19 1241      PT SHORT TERM GOAL #1   Title  Pt will be independent with HEP in order to decrease kne pain and increase strength in order to improve pain-free function at home.    Time  4    Period  Weeks    Status  Achieved    Target Date  05/13/19        PT Long Term Goals - 05/15/19 0931      PT LONG TERM GOAL #1   Title  Pt will increase LEFS to >60/80 in order to demonstrate significant improvement in lower extremity function.    Baseline  04/15/19: 31/80; 05/15/2019: 44/80    Time  12    Period  Weeks    Status  Revised    Target Date  07/08/19      PT LONG TERM GOAL #2   Title  Pt will decrease worst pain as reported on NPRS by at least 3 points in order to demonstrate clinically significant reduction in ankle/foot pain.    Baseline  04/15/19: 8/10; 05/15/19: 5/10    Time  12    Period  Weeks    Status  Partially Met    Target Date  07/08/19      PT LONG TERM GOAL #3   Title  Pt will increase strength of L hip abduction and adduction by at least 1/2 MMT grade in order to demonstrate improvement in strength and function    Baseline  04/15/19: 4-/5 for both L hip abduction and adduction; 05/15/19: 4-/5 for hip adduction, 4+/5 hip abduction    Time  12    Period  Weeks    Status  Partially Met    Target Date  07/08/19      PT LONG TERM GOAL #4   Title  Pt will be able to stand for at least 50 minutes in order to be able to cook in the kitchen without needing to sit and rest    Baseline  04/15/19:  30 minutes max standing time; 05/15/19: stood for an hour and a half waiting to vote    Time  12    Status  Achieved      PT LONG TERM GOAL #5   Title  Pt will increase her L knee AROM to within 5 deg  of her R knee ROM in order to improve functional mobility.    Baseline  04/15/19: R/L flexion = 135/108, R/L extension = -5 (hyperextension)/5; 05/15/19: R/L flexion = 135/118, R/L extension  = -5 (hyperextension/3    Time  12    Period  Weeks    Status  New    Target Date  07/08/19            Plan - 06/02/19 0809    Clinical Impression Statement  Pt continues to demonstrate excellent motivation throughout her PT sessions.  She continues to respond well to progression of strength exercises, demonstrating the ability to increase weight again today. She complains of some lateral "pulling" sensations in her distal quad so focused STM to this area. Also continued with patellar mobilizations for improved extension and to decrease pulling in lateral quad. She continues to demonstrate quad and hip flexor weakness with SLR as well as abductor weakness with sidelying leg lifts.  Pt will continue to benefit from skilled PT services  to address deficits in strength, mobility, and pain in order to return to full function at home with less knee pain.    Personal Factors and Comorbidities  Age;Comorbidity 1    Comorbidities  DM    Examination-Activity Limitations  Locomotion Level;Squat;Stairs    Examination-Participation Restrictions  Laundry;Shop;Volunteer;Community Activity    Stability/Clinical Decision Making  Stable/Uncomplicated    Rehab Potential  Excellent    PT Frequency  2x / week    PT Duration  12 weeks    PT Treatment/Interventions  ADLs/Self Care Home Management;Aquatic Therapy;Canalith Repostioning;Cryotherapy;Electrical Stimulation;Iontophoresis 93m/ml Dexamethasone;Traction;Moist Heat;Ultrasound;DME Instruction;Gait training;Functional mobility training;Stair training;Therapeutic  activities;Therapeutic exercise;Balance training;Neuromuscular re-education;Patient/family education;Manual techniques;Passive range of motion;Dry needling;Splinting;Vestibular;Joint Manipulations    PT Next Visit Plan  continue to progres strength and balance in LLE; joint mobs and scar mobilization to promote increased ROM    PT Home Exercise Plan  Access Code: [email or web address]   Consulted and Agree with Plan of Care  Patient       Patient will benefit from skilled therapeutic intervention in order to improve the following deficits and impairments:  Abnormal gait, Difficulty walking, Decreased strength, Pain  Visit Diagnosis: Acute pain of left knee  Muscle weakness (generalized)     Problem List Patient Active Problem List   Diagnosis Date Noted  . Primary osteoarthritis of left knee 03/25/2019  . Multiple pulmonary nodules 09/01/2016  . Cigarette smoker 09/01/2016  . Diabetes ([place] 05/02/2013  . Primary biliary cirrhosis ([place] 05/02/2013   JPhillips GroutPT, DPT, GCS  Huprich,Jason 06/02/2019, 8:49 AM  CSouth ShoreMAIN [facility]al[facility] IncSERVICES [address] [place] [address]Phone: [phone]  Fax:  [phone] Name: MNIKKIA DEVOSSMRN: [id number] of Birth: [birth year]

## 2019-06-03 ENCOUNTER — Other Ambulatory Visit: Payer: Self-pay

## 2019-06-03 DIAGNOSIS — Z20822 Contact with and (suspected) exposure to covid-19: Secondary | ICD-10-CM

## 2019-06-04 ENCOUNTER — Ambulatory Visit: Payer: Medicare Other

## 2019-06-05 LAB — NOVEL CORONAVIRUS, NAA: SARS-CoV-2, NAA: NOT DETECTED

## 2019-06-10 ENCOUNTER — Other Ambulatory Visit: Payer: Self-pay

## 2019-06-10 ENCOUNTER — Ambulatory Visit: Payer: Medicare Other | Attending: Orthopaedic Surgery

## 2019-06-10 DIAGNOSIS — M6281 Muscle weakness (generalized): Secondary | ICD-10-CM | POA: Diagnosis present

## 2019-06-10 DIAGNOSIS — M25562 Pain in left knee: Secondary | ICD-10-CM | POA: Diagnosis not present

## 2019-06-10 NOTE — Therapy (Signed)
[place] MAIN [facility] SERVICES [address] [place], [place], [address] Phone: [phone]   Fax:  [phone]  Physical Therapy Treatment  Patient Details  Name: Natasha Franco MRN: [id number] Date of Birth: 12/23/1950 Referring Provider (PT): Dr. Rhona Raider   Encounter Date: 06/10/2019  PT End of Session - 06/10/19 1411    Visit Number  16    Number of Visits  25    PT Start Time  1350    PT Stop Time  1430    PT Time Calculation (min)  40 min    Activity Tolerance  Patient tolerated treatment well    Behavior During Therapy  [facility] for tasks assessed/performed       Past Medical History:  Diagnosis Date  . Arthritis   . Complication of anesthesia   . Diabetes mellitus without complication ([place])   . GERD (gastroesophageal reflux disease)   . Migraines   . 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 ABDOMINAL HYSTERECTOMY    . BREAST BIOPSY Left 2014  . BREAST SURGERY Bilateral 1984   reduction mammoplasty  . CARPAL TUNNEL RELEASE    . CESAREAN SECTION    . JOINT REPLACEMENT    . REDUCTION MAMMAPLASTY Bilateral   . ROTATOR CUFF REPAIR    . TONSILLECTOMY    . TOTAL KNEE ARTHROPLASTY Left 03/25/2019   Procedure: Left Knee Arthroplasty;  Surgeon: [place] Nakayama, MD;  Location: [facility];  Service: Orthopedics;  Laterality: Left;    There were no vitals filed for this visit.  Subjective Assessment - 06/10/19 1407    Subjective  Pt reports she has been more active this week decorating her house for Christmas and overall on her feet more.  Her knee feels a little more sore because of this (4/10). She is using ice pack and palmer's cream on her knee and this helps with the pain.    Pertinent History  Pt underwent L TKR 03/25/19 without any reported post-op complications. She had a previous surgery to L knee many years prior for a meniscal cyst. Pt reports no issues with R  knee. Pt had her 10 day follow-up without with orthopedics without any issues. She had 5 visits of [facility] PT before coming to outpatient. She has a CPM machine at home and has been able to increase the range of motion for flexion to 105 degrees. Next orthopedic follow-up appointment is 04/23/19. She has been using her pneumatic compression/ice machine at home as well.    Currently in Pain?  Yes    Pain Score  4        INTERVENTION THIS DATE: -AMB over-ground 810f s AD, 4:37", 0.824m -Quad set 15x3secH into towel roll -Hooklying bridges 1x15 -Hooklying LLE SLR x 15, added 3# AW x 10; -Hooklying clams with red tband x 15, increased to manual resistance x 15; -Hooklying adductor squeezes with manual resistance 1x15x3secH -Prone Knee flexion c 3lb AW 1x15 -STS from chair, BUE flexion c rainbowball 1x15  -standing LLE hip extension, slight trunk flexion 1x15  -Standing heel raises 1x20  -Left Lateral step-ups, 4" step 1x12, no UE support        PT Short Term Goals - 05/15/19 1241      PT SHORT TERM GOAL #1   Title  Pt will be independent with HEP in order to decrease kne pain and increase strength in order to  improve pain-free function at home.    Time  4    Period  Weeks    Status  Achieved    Target Date  05/13/19        PT Long Term Goals - 05/15/19 0931      PT LONG TERM GOAL #1   Title  Pt will increase LEFS to >60/80 in order to demonstrate significant improvement in lower extremity function.    Baseline  04/15/19: 31/80; 05/15/2019: 44/80    Time  12    Period  Weeks    Status  Revised    Target Date  07/08/19      PT LONG TERM GOAL #2   Title  Pt will decrease worst pain as reported on NPRS by at least 3 points in order to demonstrate clinically significant reduction in ankle/foot pain.    Baseline  04/15/19: 8/10; 05/15/19: 5/10    Time  12    Period  Weeks    Status  Partially Met    Target Date  07/08/19      PT LONG TERM GOAL #3   Title  Pt will increase  strength of L hip abduction and adduction by at least 1/2 MMT grade in order to demonstrate improvement in strength and function    Baseline  04/15/19: 4-/5 for both L hip abduction and adduction; 05/15/19: 4-/5 for hip adduction, 4+/5 hip abduction    Time  12    Period  Weeks    Status  Partially Met    Target Date  07/08/19      PT LONG TERM GOAL #4   Title  Pt will be able to stand for at least 50 minutes in order to be able to cook in the kitchen without needing to sit and rest    Baseline  04/15/19: 30 minutes max standing time; 05/15/19: stood for an hour and a half waiting to vote    Time  12    Status  Achieved      PT LONG TERM GOAL #5   Title  Pt will increase her L knee AROM to within 5 deg of her R knee ROM in order to improve functional mobility.    Baseline  04/15/19: R/L flexion = 135/108, R/L extension = -5 (hyperextension)/5; 05/15/19: R/L flexion = 135/118, R/L extension  = -5 (hyperextension/3    Time  12    Period  Weeks    Status  New    Target Date  07/08/19            Plan - 06/10/19 1406    Clinical Impression Statement  In general, patient demonstrating good tolerance to therapy session this date, reasonable accommodations are alllowed in-session to allow adequate rest between activities as needed, which are few and far between. All interventional executed without any exacerbation of pain or other symptoms. Pt initially struggles with TKE ROM, but after warm-up has improved ROM. Progressed intervention exercises this date to include more weighted resistance and standing exercise. Pt given intermittent multimodal cues to teach best possible form with exercises. Pt continues to make steady progress toward most goals. No home exercise updates made at this time.    Personal Factors and Comorbidities  Age;Comorbidity 1    Comorbidities  DM    Examination-Participation Restrictions  Laundry;Shop;Volunteer;Community Activity    Stability/Clinical Decision Making   Stable/Uncomplicated    Rehab Potential  Excellent    PT Frequency  2x / week  PT Duration  12 weeks    PT Treatment/Interventions  ADLs/Self Care Home Management;Aquatic Therapy;Canalith Repostioning;Cryotherapy;Electrical Stimulation;Iontophoresis 57m/ml Dexamethasone;Traction;Moist Heat;Ultrasound;DME Instruction;Gait training;Functional mobility training;Stair training;Therapeutic activities;Therapeutic exercise;Balance training;Neuromuscular re-education;Patient/family education;Manual techniques;Passive range of motion;Dry needling;Splinting;Vestibular;Joint Manipulations    PT Next Visit Plan  continue to progres strength and balance in LLE; joint mobs and scar mobilization to promote increased ROM       Patient will benefit from skilled therapeutic intervention in order to improve the following deficits and impairments:  Abnormal gait, Difficulty walking, Decreased strength, Pain  Visit Diagnosis: Acute pain of left knee  Muscle weakness (generalized)     Problem List Patient Active Problem List   Diagnosis Date Noted  . Primary osteoarthritis of left knee 03/25/2019  . Multiple pulmonary nodules 09/01/2016  . Cigarette smoker 09/01/2016  . Diabetes ([place] 05/02/2013  . Primary biliary cirrhosis ([place] 05/02/2013   3:19 PM, 06/10/19 AEtta Grandchild PT, DPT Physical Therapist - [facility] Physical [phone]    BEtta Grandchild12/07/2018, 3:14 PM  CGoodfieldMAIN ROphthalmology Associates LLCSERVICES [address] StreetRVilla Park [place] [address]Phone: [phone]  Fax:  [phone] Name: Natasha COUILLARDMRN: [id number] of Birth: 101-27-1952

## 2019-06-12 ENCOUNTER — Other Ambulatory Visit: Payer: Self-pay

## 2019-06-12 ENCOUNTER — Ambulatory Visit: Payer: Medicare Other

## 2019-06-12 DIAGNOSIS — M25562 Pain in left knee: Secondary | ICD-10-CM | POA: Diagnosis not present

## 2019-06-12 DIAGNOSIS — M6281 Muscle weakness (generalized): Secondary | ICD-10-CM

## 2019-06-12 NOTE — Therapy (Signed)
[place] MAIN [facility] SERVICES [address] [place], [place], [address] Phone: [phone]   Fax:  [phone]  Physical Therapy Treatment  Patient Details  Name: Natasha Franco MRN: [id number] Date of Birth: [birth year] Referring Provider (PT): Dr. Rhona Raider   Encounter Date: 06/12/2019  PT End of Session - 06/12/19 0900    Visit Number  17    Number of Visits  25    Date for PT Re-Evaluation  07/08/19    Authorization Type  eval: 04/15/19    PT Start Time  0901   Pt arrived late   PT Stop Time  0930    PT Time Calculation (min)  29 min    Equipment Utilized During Treatment  Gait belt    Activity Tolerance  Patient tolerated treatment well;No increased pain    Behavior During Therapy  W[facility] for tasks assessed/performed       Past Medical History:  Diagnosis Date  . Arthritis   . Complication of anesthesia   . Diabetes mellitus without complication ([place])   . GERD (gastroesophageal reflux disease)   . Migraines   . 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 ABDOMINAL HYSTERECTOMY    . BREAST BIOPSY Left 2014  . BREAST SURGERY Bilateral 1984   reduction mammoplasty  . CARPAL TUNNEL RELEASE    . CESAREAN SECTION    . JOINT REPLACEMENT    . REDUCTION MAMMAPLASTY Bilateral   . ROTATOR CUFF REPAIR    . TONSILLECTOMY    . TOTAL KNEE ARTHROPLASTY Left 03/25/2019   Procedure: Left Knee Arthroplasty;  Surgeon: [place] Nakayama, MD;  [facility];  Service: Orthopedics;  Laterality: Left;    There were no vitals filed for this visit.  Subjective Assessment - 06/12/19 0900    Subjective  Pt reports she went to allergist afte rlast session and was put on  prednisone which has really helped her knee pain collaterally. She has no pain this date, denies other updates.    Pertinent History  Pt underwent L TKR 03/25/19 without any reported post-op complications. She had a  previous surgery to L knee many years prior for a meniscal cyst. Pt reports no issues with R knee. Pt had her 10 day follow-up without with orthopedics without any issues. She had 5 visits of [facility] PT before coming to outpatient. She has a CPM machine at home and has been able to increase the range of motion for flexion to 105 degrees. Next orthopedic follow-up appointment is 04/23/19. She has been using her pneumatic compression/ice machine at home as well.        INTERVENTION THIS DATE: -Heel Hang off step calf stretch 2x30sec bilat -Lunge knee flexion stretch on step 3x30sec -TKE stretch heel on step 3x30sec  -AMB over-ground 835f s AD; more symmetrical, but harder impact with less control on RLE. Mild Rt compensated trendelenburg. 3 minutes, 39 seconds, 1.159m, consistent motor patterns and speed from beginning to end, no exacerbation of pain; last session 4:37", 0.8853m -Supine SAQ over 9" bolster 2x15x3secH, 5lb AW  -Hooklying bringe, 2x15; knees at 90 for hamstrings activation, narrow stance to promote vastus lateralis stretch  -Prone Knee flexion c 5lb AW 1x15 -SLS balance c contralateral end range hip/knee fleixon 10x10 sec bilat, alternating sides, firm surface -resisted cable walking retro concentric, fwd eccentric [id number], 12.5, 17.5lbs, to simulate TKE control and locking  on incline/decline surfaces     PT Short Term Goals - 05/15/19 1241      PT SHORT TERM GOAL #1   Title  Pt will be independent with HEP in order to decrease kne pain and increase strength in order to improve pain-free function at home.    Time  4    Period  Weeks    Status  Achieved    Target Date  05/13/19        PT Long Term Goals - 05/15/19 0931      PT LONG TERM GOAL #1   Title  Pt will increase LEFS to >60/80 in order to demonstrate significant improvement in lower extremity function.    Baseline  04/15/19: 31/80; 05/15/2019: 44/80    Time  12    Period  Weeks    Status  Revised     Target Date  07/08/19      PT LONG TERM GOAL #2   Title  Pt will decrease worst pain as reported on NPRS by at least 3 points in order to demonstrate clinically significant reduction in ankle/foot pain.    Baseline  04/15/19: 8/10; 05/15/19: 5/10    Time  12    Period  Weeks    Status  Partially Met    Target Date  07/08/19      PT LONG TERM GOAL #3   Title  Pt will increase strength of L hip abduction and adduction by at least 1/2 MMT grade in order to demonstrate improvement in strength and function    Baseline  04/15/19: 4-/5 for both L hip abduction and adduction; 05/15/19: 4-/5 for hip adduction, 4+/5 hip abduction    Time  12    Period  Weeks    Status  Partially Met    Target Date  07/08/19      PT LONG TERM GOAL #4   Title  Pt will be able to stand for at least 50 minutes in order to be able to cook in the kitchen without needing to sit and rest    Baseline  04/15/19: 30 minutes max standing time; 05/15/19: stood for an hour and a half waiting to vote    Time  12    Status  Achieved      PT LONG TERM GOAL #5   Title  Pt will increase her L knee AROM to within 5 deg of her R knee ROM in order to improve functional mobility.    Baseline  04/15/19: R/L flexion = 135/108, R/L extension = -5 (hyperextension)/5; 05/15/19: R/L flexion = 135/118, R/L extension  = -5 (hyperextension/3    Time  12    Period  Weeks    Status  New    Target Date  07/08/19            Plan - 06/12/19 5885    Clinical Impression Statement  Pt doing well this session, able to take part in intervention as planned without any significant pain or fatigue limitations. Pt able to progress resistance this session. AMB speed substantially improved, but may be artifact to  Prednisone to some degree. Pt continues to progress well. Al interventions performed with supervision for safety, multimodal cues for correct exercise form.    Stability/Clinical Decision Making  Stable/Uncomplicated    Rehab Potential  Excellent     PT Frequency  2x / week    PT Duration  12 weeks    PT Treatment/Interventions  ADLs/Self Care Home  Management;Aquatic Therapy;Canalith Repostioning;Cryotherapy;Electrical Stimulation;Iontophoresis 74m/ml Dexamethasone;Traction;Moist Heat;Ultrasound;DME Instruction;Gait training;Functional mobility training;Stair training;Therapeutic activities;Therapeutic exercise;Balance training;Neuromuscular re-education;Patient/family education;Manual techniques;Passive range of motion;Dry needling;Splinting;Vestibular;Joint Manipulations    PT Next Visit Plan  continue to progres strength and balance in LLE; joint mobs and scar mobilization to promote increased ROM    PT Home Exercise Plan  Access Code: [email or web address]   Consulted and Agree with Plan of Care  Patient       Patient will benefit from skilled therapeutic intervention in order to improve the following deficits and impairments:  Abnormal gait, Difficulty walking, Decreased strength, Pain  Visit Diagnosis: Acute pain of left knee  Muscle weakness (generalized)     Problem List Patient Active Problem List   Diagnosis Date Noted  . Primary osteoarthritis of left knee 03/25/2019  . Multiple pulmonary nodules 09/01/2016  . Cigarette smoker 09/01/2016  . Diabetes ([place] 05/02/2013  . Primary biliary cirrhosis ([place] 05/02/2013   9:27 AM, 06/12/19 AEtta Grandchild PT, DPT Physical Therapist - [facility]- [phone]    BEtta Grandchild12/09/2018, 9:08 AM  CThrockmortonMAIN RMedical City North HillsSERVICES [address] [address]Phone: [phone]  Fax:  [phone] Name: Natasha STJAMESMRN: [id number] of Birth: [birth year]

## 2019-06-18 ENCOUNTER — Ambulatory Visit: Payer: Medicare Other

## 2019-06-18 ENCOUNTER — Other Ambulatory Visit: Payer: Self-pay

## 2019-06-18 DIAGNOSIS — M25562 Pain in left knee: Secondary | ICD-10-CM

## 2019-06-18 DIAGNOSIS — M6281 Muscle weakness (generalized): Secondary | ICD-10-CM

## 2019-06-18 NOTE — Therapy (Signed)
[place] MAIN [facility] SERVICES [address] [place], [place], [address] Phone: [phone]   Fax:  [phone]  Physical Therapy Treatment  Patient Details  Name: Natasha Franco MRN: [id number] Date of Birth: 1950/10/09 Referring Provider (PT): Dr. Rhona Raider   Encounter Date: 06/18/2019  PT End of Session - 06/19/19 0854    Visit Number  18    Number of Visits  25    Date for PT Re-Evaluation  07/08/19    Authorization Type  eval: 04/15/19    PT Start Time  1605    PT Stop Time  1645    PT Time Calculation (min)  40 min    Equipment Utilized During Treatment  Gait belt    Activity Tolerance  Patient tolerated treatment well    Behavior During Therapy  [facility] for tasks assessed/performed       Past Medical History:  Diagnosis Date  . Arthritis   . Complication of anesthesia   . Diabetes mellitus without complication ([place])   . GERD (gastroesophageal reflux disease)   . Migraines   . 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 ABDOMINAL HYSTERECTOMY    . BREAST BIOPSY Left 2014  . BREAST SURGERY Bilateral 1984   reduction mammoplasty  . CARPAL TUNNEL RELEASE    . CESAREAN SECTION    . JOINT REPLACEMENT    . REDUCTION MAMMAPLASTY Bilateral   . ROTATOR CUFF REPAIR    . TONSILLECTOMY    . TOTAL KNEE ARTHROPLASTY Left 03/25/2019   Procedure: Left Knee Arthroplasty;  Surgeon: [place] Nakayama, MD;  [facility];  Service: Orthopedics;  Laterality: Left;    There were no vitals filed for this visit.  Subjective Assessment - 06/19/19 0850    Subjective  Pt states that her L knee is very sore today. [place] and had to go up/down a lot of stairs to get into/out of the house. Otherwise no specific questions or concerns at this time.    Pertinent History  Pt underwent L TKR 03/25/19 without any reported post-op complications. She had a previous surgery to  L knee many years prior for a meniscal cyst. Pt reports no issues with R knee. Pt had her 10 day follow-up without with orthopedics without any issues. She had 5 visits of [facility] PT before coming to outpatient. She has a CPM machine at home and has been able to increase the range of motion for flexion to 105 degrees. Next orthopedic follow-up appointment is 04/23/19. She has been using her pneumatic compression/ice machine at home as well.    Limitations  Walking    How long can you stand comfortably?  30 minutes    Patient Stated Goals  Decrease pain and improve knee strength    Currently in Pain?  Yes    Pain Score  4     Pain Location  Knee    Pain Orientation  Left    Pain Descriptors / Indicators  Sore    Pain Onset  More than a month ago          TREATMENT   Ther-ex  NuStep L2-3 x 5 minutes for warm-up; IFC over L knee at pt tolerated intensity of 15V for pain control during exercises: L quad sets over towel roll 5s hold x 10; L SLR with quad set x 10; Hooklying bridges 3s hold  x 10; Hooklying clams with manual resistance x 10; Hookylyg adductor squeeze with manual resistance x 10; R sidelying L hip abduction SLR x 10;   Manual Therapy  STM to distal L quad, especially lateral with effleurage. Also utilized roller to entire quad to decrease muscle tension; L patella mobilization inferior and superior grade III, 30s/bout x 3 bouts each; L HS stretch x 30s; Prone L quad MET stretch 5s hold/5s relax x 4 cycles; Ice applied to L knee at end of session (unbilled);   Pt educated throughout session about proper posture and technique with exercises. Improved exercise technique, movement at target joints, use of target muscles after min to mod verbal, visual, tactile cues.   Ptcontinues todemonstrateexcellentmotivation throughouther PT sessions.She reports increase in L knee soreness/pain after being down at the beach. Increase time devoted today to soft tissue mobilization  and stretching. Also utilized IFC throughout exercises to decrease pain. Pt reports that she has no pain at the end of her session. She continues to respond well to progression of strength exercises. Pt will continue to benefit from skilled PT services to address deficits in strength, mobility, and pain in order to return to full function at home with less knee pain.                       PT Short Term Goals - 05/15/19 1241      PT SHORT TERM GOAL #1   Title  Pt will be independent with HEP in order to decrease kne pain and increase strength in order to improve pain-free function at home.    Time  4    Period  Weeks    Status  Achieved    Target Date  05/13/19        PT Long Term Goals - 05/15/19 0931      PT LONG TERM GOAL #1   Title  Pt will increase LEFS to >60/80 in order to demonstrate significant improvement in lower extremity function.    Baseline  04/15/19: 31/80; 05/15/2019: 44/80    Time  12    Period  Weeks    Status  Revised    Target Date  07/08/19      PT LONG TERM GOAL #2   Title  Pt will decrease worst pain as reported on NPRS by at least 3 points in order to demonstrate clinically significant reduction in ankle/foot pain.    Baseline  04/15/19: 8/10; 05/15/19: 5/10    Time  12    Period  Weeks    Status  Partially Met    Target Date  07/08/19      PT LONG TERM GOAL #3   Title  Pt will increase strength of L hip abduction and adduction by at least 1/2 MMT grade in order to demonstrate improvement in strength and function    Baseline  04/15/19: 4-/5 for both L hip abduction and adduction; 05/15/19: 4-/5 for hip adduction, 4+/5 hip abduction    Time  12    Period  Weeks    Status  Partially Met    Target Date  07/08/19      PT LONG TERM GOAL #4   Title  Pt will be able to stand for at least 50 minutes in order to be able to cook in the kitchen without needing to sit and rest    Baseline  04/15/19: 30 minutes max standing time; 05/15/19: stood  for an hour and a  half waiting to vote    Time  12    Status  Achieved      PT LONG TERM GOAL #5   Title  Pt will increase her L knee AROM to within 5 deg of her R knee ROM in order to improve functional mobility.    Baseline  04/15/19: R/L flexion = 135/108, R/L extension = -5 (hyperextension)/5; 05/15/19: R/L flexion = 135/118, R/L extension  = -5 (hyperextension/3    Time  12    Period  Weeks    Status  New    Target Date  07/08/19            Plan - 06/19/19 0855    Clinical Impression Statement  Pt continues to demonstrate excellent motivation throughout her PT sessions. She reports increase in L knee soreness/pain after being down at the beach. Increase time devoted today to soft tissue mobilization and stretching. Also utilized IFC throughout exercises to decrease pain. Pt reports that she has no pain at the end of her session. She continues to respond well to progression of strength exercises.  Pt will continue to benefit from skilled PT services  to address deficits in strength, mobility, and pain in order to return to full function at home with less knee pain.    Personal Factors and Comorbidities  Age;Comorbidity 1    Examination-Activity Limitations  Locomotion Level;Squat;Stairs    Examination-Participation Restrictions  Church    Stability/Clinical Decision Making  Stable/Uncomplicated    Clinical Decision Making  Low    Rehab Potential  Excellent    PT Frequency  2x / week    PT Duration  12 weeks    PT Treatment/Interventions  ADLs/Self Care Home Management;Aquatic Therapy;Canalith Repostioning;Cryotherapy;Electrical Stimulation;Iontophoresis 5m/ml Dexamethasone;Traction;Moist Heat;Ultrasound;DME Instruction;Gait training;Functional mobility training;Stair training;Therapeutic activities;Therapeutic exercise;Balance training;Neuromuscular re-education;Patient/family education;Manual techniques;Passive range of motion;Dry needling;Splinting;Vestibular;Joint Manipulations     PT Next Visit Plan  continue to progres strength and balance in LLE; joint mobs and scar mobilization to promote increased ROM    PT Home Exercise Plan  Access Code: [email or web address]   Consulted and Agree with Plan of Care  Patient       Patient will benefit from skilled therapeutic intervention in order to improve the following deficits and impairments:  Abnormal gait, Difficulty walking, Decreased strength, Pain  Visit Diagnosis: Acute pain of left knee  Muscle weakness (generalized)     Problem List Patient Active Problem List   Diagnosis Date Noted  . Primary osteoarthritis of left knee 03/25/2019  . Multiple pulmonary nodules 09/01/2016  . Cigarette smoker 09/01/2016  . Diabetes ([place] 05/02/2013  . Primary biliary cirrhosis (HW[place] 05/02/2013   JPhillips GroutPT, DPT, GCS  Audon Heymann 06/19/2019, 10:03 AM  CSt. MartinMAIN RNorthern Light [facility] Hill [facility] HospitalSERVICES [address] [address]Phone: [phone]  Fax:  [phone] Name: Natasha SILLERMRN: [id number] of Birth: 130-Nov-1952

## 2019-06-19 ENCOUNTER — Ambulatory Visit: Payer: Medicare Other

## 2019-06-19 ENCOUNTER — Other Ambulatory Visit: Payer: Self-pay

## 2019-06-19 DIAGNOSIS — M25562 Pain in left knee: Secondary | ICD-10-CM | POA: Diagnosis not present

## 2019-06-19 DIAGNOSIS — M6281 Muscle weakness (generalized): Secondary | ICD-10-CM

## 2019-06-19 NOTE — Therapy (Signed)
[place] MAIN [facility] SERVICES [address] [place], [place], [address] Phone: [phone]   Fax:  [phone]  Physical Therapy Treatment  Patient Details  Name: Natasha Franco MRN: [id number] Date of Birth: 1950-10-07 Referring Provider (PT): Dr. Rhona Raider   Encounter Date: 06/19/2019  PT End of Session - 06/19/19 1006    Visit Number  19    Number of Visits  25    Date for PT Re-Evaluation  07/08/19    Authorization Type  eval: 04/15/19    PT Start Time  0846    PT Stop Time  0940    PT Time Calculation (min)  54 min    Equipment Utilized During Treatment  Gait belt    Activity Tolerance  Patient tolerated treatment well    Behavior During Therapy  [facility] for tasks assessed/performed       Past Medical History:  Diagnosis Date  . Arthritis   . Complication of anesthesia   . Diabetes mellitus without complication ([place])   . GERD (gastroesophageal reflux disease)   . Migraines   . 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 ABDOMINAL HYSTERECTOMY    . BREAST BIOPSY Left 2014  . BREAST SURGERY Bilateral 1984   reduction mammoplasty  . CARPAL TUNNEL RELEASE    . CESAREAN SECTION    . JOINT REPLACEMENT    . REDUCTION MAMMAPLASTY Bilateral   . ROTATOR CUFF REPAIR    . TONSILLECTOMY    . TOTAL KNEE ARTHROPLASTY Left 03/25/2019   Procedure: Left Knee Arthroplasty;  Surgeon: Melrose Nakayama, MD;  Location: [facility];  Service: Orthopedics;  Laterality: Left;    There were no vitals filed for this visit.  Subjective Assessment - 06/19/19 1005    Subjective  Pt reports improved L knee pain after last therapy session. She reports 1/10 pain upon arrival today. No specific questions or concerns at this time.    Pertinent History  Pt underwent L TKR 03/25/19 without any reported post-op complications. She had a previous surgery to L knee many years prior for a meniscal cyst.  Pt reports no issues with R knee. Pt had her 10 day follow-up without with orthopedics without any issues. She had 5 visits of [facility] PT before coming to outpatient. She has a CPM machine at home and has been able to increase the range of motion for flexion to 105 degrees. Next orthopedic follow-up appointment is 04/23/19. She has been using her pneumatic compression/ice machine at home as well.    Limitations  Walking    How long can you stand comfortably?  30 minutes    Patient Stated Goals  Decrease pain and improve knee strength    Currently in Pain?  Yes    Pain Score  1     Pain Location  Knee    Pain Orientation  Left    Pain Descriptors / Indicators  Sore    Pain Type  Surgical pain    Pain Onset  More than a month ago          TREATMENT   Ther-ex  NuStep L2-3 x 5 minutes for warm-up, legs only with therapist gradually sliding seat forward to increase L knee flexion; Quantum single leg press 90# x 15, 105# x 15 bilateral; Quantum single leg heel raises 105# 2 x 15 bilateral; 6" forward step-ups leading with LLE up and RLE  down; 6" lateral step-ups leading with LLE up and RLE down; 6" cross-over step-ups leading with LLE up and RLE down; TRX reverse lunges with RLE dropping back to strengthening RLE x 10; TRX L lateral lunges with cues from therapist to keep toes forward and more weight over heels x 10; Matrix resisted gait forward, retro, R lateral, and L lateral 17.5# x 3 each direction;   Manual Therapy  STM to distal quad, especially lateral with effleurage. Also utilized roller to entire quad to decrease muscle tension; IFC over L knee at pt tolerated intensity of 13.5V for pain control at end of session with ice pack applied to knee x 5 minutes (unbilled);   Pt educated throughout session about proper posture and technique with exercises. Improved exercise technique, movement at target joints, use of target muscles after min to mod verbal, visual, tactile cues.     Ptcontinues todemonstrateexcellentmotivation throughouther PT sessions. L knee soreness is considerably better today so able to reintroduce some more intense strengthening today. Pt denies any increase in pain with all exercises. She is able to increase her resistance on the leg press. Will update outcome measures and goals at next session for a progress note to update orthopedist. Utilized IFC at end of session with ice for pain modulation.Pt will continue to benefit from skilled PT services to address deficits in strength, mobility, and pain in order to return to full function at home with less knee pain.                      PT Short Term Goals - 05/15/19 1241      PT SHORT TERM GOAL #1   Title  Pt will be independent with HEP in order to decrease kne pain and increase strength in order to improve pain-free function at home.    Time  4    Period  Weeks    Status  Achieved    Target Date  05/13/19        PT Long Term Goals - 05/15/19 0931      PT LONG TERM GOAL #1   Title  Pt will increase LEFS to >60/80 in order to demonstrate significant improvement in lower extremity function.    Baseline  04/15/19: 31/80; 05/15/2019: 44/80    Time  12    Period  Weeks    Status  Revised    Target Date  07/08/19      PT LONG TERM GOAL #2   Title  Pt will decrease worst pain as reported on NPRS by at least 3 points in order to demonstrate clinically significant reduction in ankle/foot pain.    Baseline  04/15/19: 8/10; 05/15/19: 5/10    Time  12    Period  Weeks    Status  Partially Met    Target Date  07/08/19      PT LONG TERM GOAL #3   Title  Pt will increase strength of L hip abduction and adduction by at least 1/2 MMT grade in order to demonstrate improvement in strength and function    Baseline  04/15/19: 4-/5 for both L hip abduction and adduction; 05/15/19: 4-/5 for hip adduction, 4+/5 hip abduction    Time  12    Period  Weeks    Status  Partially Met     Target Date  07/08/19      PT LONG TERM GOAL #4   Title  Pt will be able to stand for  at least 50 minutes in order to be able to cook in the kitchen without needing to sit and rest    Baseline  04/15/19: 30 minutes max standing time; 05/15/19: stood for an hour and a half waiting to vote    Time  12    Status  Achieved      PT LONG TERM GOAL #5   Title  Pt will increase her L knee AROM to within 5 deg of her R knee ROM in order to improve functional mobility.    Baseline  04/15/19: R/L flexion = 135/108, R/L extension = -5 (hyperextension)/5; 05/15/19: R/L flexion = 135/118, R/L extension  = -5 (hyperextension/3    Time  12    Period  Weeks    Status  New    Target Date  07/08/19            Plan - 06/19/19 1006    Clinical Impression Statement  Pt continues to demonstrate excellent motivation throughout her PT sessions. L knee soreness is considerably better today so able to reintroduce some more intense strengthening today. Pt denies any increase in pain with all exercises. She is able to increase her resistance on the leg press. Will update outcome measures and goals at next session for a progress note to update orthopedist. Utilized IFC at end of session with ice for pain modulation. Pt will continue to benefit from skilled PT services  to address deficits in strength, mobility, and pain in order to return to full function at home with less knee pain.    Personal Factors and Comorbidities  Age;Comorbidity 1    Examination-Activity Limitations  Locomotion Level;Squat;Stairs    Examination-Participation Restrictions  Church    Stability/Clinical Decision Making  Stable/Uncomplicated    Rehab Potential  Excellent    PT Frequency  2x / week    PT Duration  12 weeks    PT Treatment/Interventions  ADLs/Self Care Home Management;Aquatic Therapy;Canalith Repostioning;Cryotherapy;Electrical Stimulation;Iontophoresis 46m/ml Dexamethasone;Traction;Moist Heat;Ultrasound;DME Instruction;Gait  training;Functional mobility training;Stair training;Therapeutic activities;Therapeutic exercise;Balance training;Neuromuscular re-education;Patient/family education;Manual techniques;Passive range of motion;Dry needling;Splinting;Vestibular;Joint Manipulations    PT Next Visit Plan  Update outcome measures/goals, get ROM measures, continue to progress strength and balance in LLE; joint mobs and scar mobilization to promote increased ROM    PT Home Exercise Plan  Access Code: [email or web address]   Consulted and Agree with Plan of Care  Patient       Patient will benefit from skilled therapeutic intervention in order to improve the following deficits and impairments:  Abnormal gait, Difficulty walking, Decreased strength, Pain  Visit Diagnosis: Acute pain of left knee  Muscle weakness (generalized)     Problem List Patient Active Problem List   Diagnosis Date Noted  . Primary osteoarthritis of left knee 03/25/2019  . Multiple pulmonary nodules 09/01/2016  . Cigarette smoker 09/01/2016  . Diabetes ([place] 05/02/2013  . Primary biliary cirrhosis ([place] 05/02/2013   JPhillips GroutPT, DPT, GCS  Huprich,Jason 06/19/2019, 10:45 AM  [place] [facility] [address] [address]Phone: [phone]  Fax:  [phone] Name: Natasha MAISHMRN: [id number] of Birth: 1May 31, 1952

## 2019-06-20 ENCOUNTER — Encounter: Payer: Self-pay | Admitting: Gastroenterology

## 2019-06-24 ENCOUNTER — Ambulatory Visit: Payer: Medicare Other

## 2019-06-24 ENCOUNTER — Other Ambulatory Visit: Payer: Self-pay

## 2019-06-24 DIAGNOSIS — M25562 Pain in left knee: Secondary | ICD-10-CM | POA: Diagnosis not present

## 2019-06-24 DIAGNOSIS — M6281 Muscle weakness (generalized): Secondary | ICD-10-CM

## 2019-06-24 NOTE — Therapy (Signed)
[place] MAIN [facility] SERVICES [address] [place], [place], [address] Phone: [phone]   Fax:  [phone]  Physical Therapy Progress Note/Discharge   Dates of reporting period  05/15/19   to   06/24/19   Patient Details  Name: Natasha Franco MRN: [id number] Date of Birth: 1951-01-29 Referring Provider (PT): Dr. Rhona Raider   Encounter Date: 06/24/2019  PT End of Session - 06/24/19 0818    Visit Number  20    Number of Visits  25    Date for PT Re-Evaluation  07/08/19    Authorization Type  eval: 04/15/19    PT Start Time  0802    PT Stop Time  0845    PT Time Calculation (min)  43 min    Equipment Utilized During Treatment  Gait belt    Activity Tolerance  Patient tolerated treatment well    Behavior During Therapy  [facility] for tasks assessed/performed       Past Medical History:  Diagnosis Date  . Arthritis   . Complication of anesthesia   . Diabetes mellitus without complication ([place])   . GERD (gastroesophageal reflux disease)   . Migraines   . 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 ABDOMINAL HYSTERECTOMY    . BREAST BIOPSY Left 2014  . BREAST SURGERY Bilateral 1984   reduction mammoplasty  . CARPAL TUNNEL RELEASE    . CESAREAN SECTION    . JOINT REPLACEMENT    . REDUCTION MAMMAPLASTY Bilateral   . ROTATOR CUFF REPAIR    . TONSILLECTOMY    . TOTAL KNEE ARTHROPLASTY Left 03/25/2019   Procedure: Left Knee Arthroplasty;  Surgeon: Melrose Nakayama, MD;  [facility];  Service: Orthopedics;  Laterality: Left;    There were no vitals filed for this visit.  Subjective Assessment - 06/24/19 0826    Subjective  Pt reports improved L knee pain after last therapy session. She reports no pain upon arrival today. No specific questions or concerns at this time. She has a follow-up appointment with her surgeon tomorrow.    Pertinent History  Pt underwent L TKR  03/25/19 without any reported post-op complications. She had a previous surgery to L knee many years prior for a meniscal cyst. Pt reports no issues with R knee. Pt had her 10 day follow-up without with orthopedics without any issues. She had 5 visits of [facility] PT before coming to outpatient. She has a CPM machine at home and has been able to increase the range of motion for flexion to 105 degrees. Next orthopedic follow-up appointment is 04/23/19. She has been using her pneumatic compression/ice machine at home as well.    Limitations  Walking    How long can you stand comfortably?  2 hours    Patient Stated Goals  Decrease pain and improve knee strength    Currently in Pain?  No/denies    Pain Location  --    Pain Orientation  --    Pain Descriptors / Indicators  --    Pain Type  --    Pain Onset  --        TREATMENT   Ther-ex  NuStep L2-3 x 5 minutes for warm-up, legs only with therapist gradually sliding seat forward to increase L knee flexion; Supine L SLR 2# ankle weight (AW) 2 x 10; Hooklying clams with green tband 2 x 10; Hooklying adductor  squeeze with manual resistance 2 x 10; Updated outcome measures/goals with patient; Pt completed LEFS (66/80), unbilled; L knee range of motion: extension: -3, flexion: 126 degrees   Manual Therapy  IASTM to L distal quad, especially lateral with intermittent trigger point release;   Pt educated throughout session about proper posture and technique with exercises. Improved exercise technique, movement at target joints, use of target muscles after min to mod verbal, visual, tactile cues.    Ptcontinues todemonstrateexcellentmotivation throughouther PT sessions. She has met all of her goals at this point with the exception of knee ROM however it is within very good limits. She is only lacking 3 degrees of extension and has 126 degrees of flexion. Her R knee actually hyperextends and has flexion beyond what would be considered  standard ranges. Her L hip and knee strength has improved significantly. The only activity pt reports that she can't do that she would like to be able to do is to kneel and this is likely not a good position for her moving forward due to replacement. Pt provided updated HEP and will follow-up with her surgeon tomorrow. The plan is to discharge pt today pending surgeons input tomorrow. If he would like for her to continue pt will call back to notify that she will follow-up for additional sessions.                          PT Education - 06/24/19 1004    Education Details  Discharge with HEP    Person(s) Educated  Patient    Methods  Explanation;Handout    Comprehension  Verbalized understanding       PT Short Term Goals - 06/24/19 0819      PT SHORT TERM GOAL #1   Title  Pt will be independent with HEP in order to decrease kne pain and increase strength in order to improve pain-free function at home.    Time  4    Period  Weeks    Status  Achieved        PT Long Term Goals - 06/24/19 2620      PT LONG TERM GOAL #1   Title  Pt will increase LEFS to >60/80 in order to demonstrate significant improvement in lower extremity function.    Baseline  04/15/19: 31/80; 05/15/2019: 44/80; 06/24/19: 66/80    Time  12    Period  Weeks    Status  Achieved      PT LONG TERM GOAL #2   Title  Pt will decrease worst pain as reported on NPRS by at least 3 points in order to demonstrate clinically significant reduction in ankle/foot pain.    Baseline  04/15/19: 8/10; 05/15/19: 5/10; 06/24/19: 4/10 (up and down a lot of steps, extended walking)    Time  12    Period  Weeks    Status  Achieved      PT LONG TERM GOAL #3   Title  Pt will increase strength of L hip abduction and adduction by at least 1/2 MMT grade in order to demonstrate improvement in strength and function    Baseline  04/15/19: 4-/5 for both L hip abduction and adduction; 05/15/19: 4-/5 for hip adduction, 4+/5 hip  abduction; 06/24/19: 4/5 for hip adduction, 4+/5 hip abduction    Time  12    Period  Weeks    Status  Achieved      PT LONG TERM GOAL #4  Title  Pt will be able to stand for at least 50 minutes in order to be able to cook in the kitchen without needing to sit and rest    Baseline  04/15/19: 30 minutes max standing time; 05/15/19: stood for an hour and a half waiting to vote; 06/24/19: a couple hours    Time  12    Status  Achieved      PT LONG TERM GOAL #5   Title  Pt will increase her L knee AROM to within 5 deg of her R knee ROM in order to improve functional mobility.    Baseline  04/15/19: R/L flexion = 135/108, R/L extension = -5 (hyperextension)/5; 05/15/19: R/L flexion = 135/118, R/L extension  = -5 (hyperextension)/3; 06/24/19: L flexion = 126, L extension = 3 degrees    Time  12    Period  Weeks    Status  Partially Met    Target Date  07/08/19            Plan - 06/24/19 3419    Personal Factors and Comorbidities  Age;Comorbidity 1    Examination-Activity Limitations  Locomotion Level;Squat;Stairs    Examination-Participation Restrictions  Church    Stability/Clinical Decision Making  Stable/Uncomplicated    Rehab Potential  Excellent    PT Frequency  2x / week    PT Duration  12 weeks    PT Treatment/Interventions  ADLs/Self Care Home Management;Aquatic Therapy;Canalith Repostioning;Cryotherapy;Electrical Stimulation;Iontophoresis 64m/ml Dexamethasone;Traction;Moist Heat;Ultrasound;DME Instruction;Gait training;Functional mobility training;Stair training;Therapeutic activities;Therapeutic exercise;Balance training;Neuromuscular re-education;Patient/family education;Manual techniques;Passive range of motion;Dry needling;Splinting;Vestibular;Joint Manipulations    PT Next Visit Plan  Discharge    PT Home Exercise Plan  Access Code: [email or web address]   Consulted and Agree with Plan of Care  Patient       Patient will benefit from skilled therapeutic intervention in order to  improve the following deficits and impairments:  Abnormal gait, Difficulty walking, Decreased strength, Pain  Visit Diagnosis: Acute pain of left knee  Muscle weakness (generalized)     Problem List Patient Active Problem List   Diagnosis Date Noted  . Primary osteoarthritis of left knee 03/25/2019  . Multiple pulmonary nodules 09/01/2016  . Cigarette smoker 09/01/2016  . Diabetes ([place] 05/02/2013  . Primary biliary cirrhosis ([place] 05/02/2013   Natasha GroutPT, DPT, GCS  Huprich,Jason 06/24/2019, 1:37 PM  [place]MAIN RMesa SpringsSERVICES [address] [address]Phone: [phone]  Fax:  [phone] Name: MMAIRANY BRUNOMRN: [id number] of Birth: 110/29/1952

## 2019-06-24 NOTE — Patient Instructions (Signed)
Access Code: [email or web address]  URL: [email or web address]  Date: 06/24/2019  Prepared by: Roxana Hires   Exercises Supine Knee Extension Stretch on Towel Roll - 2x daily - 7x weekly Supine Heel Slide with Strap - 2x daily - 7x weekly Hamstring Stretch with Strap - 3 Reps - 30 seconds Hold - 2x daily - 7x weekly Supine Active Straight Leg Raise - 10 reps - 2 sets - 1x daily - 3x weekly Sidelying Hip Abduction - 10 reps - 2 sets - 3s hold - 2x daily - 3x weekly Supine Bridge - 10 reps - 2 sets - 3s hold - 2x daily - 3x weekly Mini Squat - 10 Reps - 2 Sets - 2x daily - 3x weekly Sit to Stand without Arm Support - 10 reps - 2 sets - 2x daily - 3x weekly Squat with Chair Touch - 10 reps - 2 sets - 2x daily - 3x weekly

## 2019-06-26 ENCOUNTER — Ambulatory Visit: Payer: Medicare Other

## 2019-07-07 ENCOUNTER — Other Ambulatory Visit: Payer: Self-pay | Admitting: Surgery

## 2019-07-07 DIAGNOSIS — R1011 Right upper quadrant pain: Secondary | ICD-10-CM

## 2019-07-16 LAB — HM DIABETES EYE EXAM

## 2019-07-17 ENCOUNTER — Ambulatory Visit
Admission: RE | Admit: 2019-07-17 | Discharge: 2019-07-17 | Disposition: A | Discharge status: Home / Self Care | Payer: Medicare PPO | Source: Ambulatory Visit | Attending: Surgery | Admitting: Surgery

## 2019-07-17 DIAGNOSIS — R1011 Right upper quadrant pain: Secondary | ICD-10-CM

## 2019-07-30 ENCOUNTER — Telehealth: Payer: Self-pay | Admitting: Interventional Cardiology

## 2019-07-30 ENCOUNTER — Ambulatory Visit: Payer: Medicare PPO | Admitting: Gastroenterology

## 2019-07-30 ENCOUNTER — Encounter: Payer: Self-pay | Admitting: Gastroenterology

## 2019-07-30 VITALS — BP 106/78 | HR 84 | Temp 98.3°F | Ht 63.0 in | Wt 187.4 lb

## 2019-07-30 DIAGNOSIS — R1011 Right upper quadrant pain: Secondary | ICD-10-CM

## 2019-07-30 DIAGNOSIS — K219 Gastro-esophageal reflux disease without esophagitis: Secondary | ICD-10-CM

## 2019-07-30 DIAGNOSIS — K760 Fatty (change of) liver, not elsewhere classified: Secondary | ICD-10-CM | POA: Diagnosis not present

## 2019-07-30 DIAGNOSIS — K743 Primary biliary cirrhosis: Secondary | ICD-10-CM

## 2019-07-30 DIAGNOSIS — Z1211 Encounter for screening for malignant neoplasm of colon: Secondary | ICD-10-CM

## 2019-07-30 MED ORDER — SUCRALFATE 1 GM/10ML PO SUSP: 1.0000 g | Freq: Four times a day (QID) | ORAL | 3 refills | Status: DC | PRN

## 2019-07-30 MED ORDER — PANTOPRAZOLE SODIUM 40 MG PO TBEC: 40.0000 mg | DELAYED_RELEASE_TABLET | Freq: Two times a day (BID) | ORAL | 1 refills | Status: DC

## 2019-07-30 MED ORDER — SUPREP BOWEL PREP KIT 17.5-3.13-1.6 GM/177ML PO SOLN: ORAL | 0 refills | Status: DC

## 2019-07-30 NOTE — Telephone Encounter (Signed)
I returned a call to the [facility] and they stated that a pa was submitted yesterday for repatha and that they are still waiting a determination

## 2019-07-30 NOTE — Patient Instructions (Addendum)
If you are age 68 or older, your body mass index should be between 23-30. Your Body mass index is 33.19 kg/m. If this is out of the aforementioned range listed, please consider follow up with your Primary Care Provider.  If you are age 47 or younger, your body mass index should be between 19-25. Your Body mass index is 33.19 kg/m. If this is out of the aformentioned range listed, please consider follow up with your Primary Care Provider.   You have been scheduled for an endoscopy and colonoscopy. Please follow the written instructions given to you at your visit today. Please pick up your prep supplies at the pharmacy within the next 1-3 days. If you use inhalers (even only as needed), please bring them with you on the day of your procedure. Your physician has requested that you go to www.startemmi.com and enter the access code given to you at your visit today. This web site gives a general overview about your procedure. However, you should still follow specific instructions given to you by our office regarding your preparation for the procedure.  We have sent the following medications to your pharmacy for you to pick up at your convenience: Suprep Carafate suspension:  Take every 6 hours as needed (be sure to take at bedtime)  Continue Protonix twice daily 30 minutes before meals  We will request your records from Dr. Charisse Klinefelter regarding [place].  Thank you for entrusting me with your care and for choosing [facility], Dr. Otsego Cellar

## 2019-07-30 NOTE — Progress Notes (Signed)
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  She states she has had reflux longstanding.  She has been on Protonix for the past 5 to 6 years or so for the symptoms.  Over the past year her dose was increased to 40 mg twice a day for worsening symptoms.  She does not have much symptoms during the day, however at night when she lies down, she will often have nocturnal regurgitation and pyrosis that bothers her significantly.  If she eats dinner after 6 PM the symptoms are much worse.  She sleeps with her head of the bed elevated to minimize symptoms but it still bothers her.  She goes to bed about 10 PM usually.  No dysphagia.  No nausea or vomiting after she eats.  She has a very good appetite without any early satiety.  She denies any weight loss or weight gain recently.  She has never had a prior EGD.  She does have some right upper quadrant pain that has been going on for years.  She states this occurs randomly without clear triggers, she does think there is some positional component to it.  She denies any postprandial abdominal pain that bothers her in this regard.  She has seen Dr. Hassell Done about this.  She had a recent ultrasound which showed normal gallbladder without any gallstones.  He denies any recent bowel changes.  She has occasional loose stools, sometimes constipated.  She has history of hemorrhoids which she thinks bothers her at times with scant blood on the toilet paper but no blood in the stools.  Her last colonoscopy was in 2011 which showed diverticulosis and some benign hyperplastic colon polyps.  She denies any family history of esophageal cancer, gastric cancer, or colon cancer.  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  She states at that time she never had any history of abnormal liver enzymes,  she took Lipitor for a period of time and had a dramatic increase in her liver enzymes.  She stopped the drug and ultimately ended up needing a liver biopsy at some point.  This was done in [place] by Dr. Charisse Klinefelter.  Liver biopsies were able to see in care everywhere, she had grade 2 chronic hepatitis with steatosis and no fibrotic changes.  Specifically they did not see any evidence of PBC per the report.  She states she had a lab test that was concerning for PBC and she has been taking ursodiol 300 mg 3 times a day for the past 7 years.  Her recent ultrasound shows steatosis but no evidence of cirrhosis or concerning changes.  Her last set of liver enzymes in August were normal.  She states she has no history of osteopenia or osteoporosis, no history of thyroid disease.  No pruritus.   Prior workup: Colonoscopy 01/31/2010 - tortous colon, 2 sigmoid polyps, internal hemorrhoids - Dr. Olevia Perches   Liver biopsy 02/2013 - Final Pathologic Diagnosis  CORE NEEDLE BIOPSIES OF LIVER:  1. CHRONIC HEPATITIS, TYPE UNDETERMINED.  2. SCHEUER GRADE: 2, STAGE: 0  3. MILD FATTY CHANGES.  4. THERE IS NO SIGNIFICANT INCREASE IN PERIPORTAL FIBROUS CONNECTIVE TISSUE.  5. NEGATIVE FOR A NEOPLASM.  6. IRON STAIN IS NEGATIVE FOR PARENCHYMAL IRON.  7. PLEASE SEE COMMENT.     [id number], [id number] x 3  Comment  The differential diagnosis includes non-alcoholic steatohepatitis (NASH),  alcohol related chronic hepatitis, drug induced hepatitis, autoimmune hepatitis,  and chronic viral hepatitis.Results of any laboratory studies and clinical  history could not be reviewed in the EPIC system at the time of review of the  slides.There are no features of cirrhosis in the biopsies.The etiology of  the hepatitis should be determined by clinical picture and results of various  laboratory studies. The pattern is not suggestive of primary biliary cirrhosis.      Past Medical  History:  Diagnosis Date  . Arthritis   . Complication of anesthesia   . Diabetes mellitus without complication ([place])   . GERD (gastroesophageal reflux disease)   . Migraines   . 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 ABDOMINAL HYSTERECTOMY    . BREAST BIOPSY Left 2014  . BREAST SURGERY Bilateral 1984   reduction mammoplasty  . CARPAL TUNNEL RELEASE    . CESAREAN SECTION    . JOINT REPLACEMENT    . REDUCTION MAMMAPLASTY Bilateral   . ROTATOR CUFF REPAIR    . TONSILLECTOMY    . TOTAL KNEE ARTHROPLASTY Left 03/25/2019   Procedure: Left Knee Arthroplasty;  Surgeon: Melrose Nakayama, MD;  Location: [facility];  Service: Orthopedics;  Laterality: Left;  Marland Kitchen VAGINAL HYSTERECTOMY     Family History  Problem Relation Age of Onset  . Arthritis Mother   . COPD Father   . Diabetes Mellitus II Father   . Colon cancer Neg Hx   . Liver cancer Neg Hx   . Stomach cancer Neg Hx    Social History   Tobacco Use  . Smoking status: Former Smoker    Packs/day: 1.00    Years: 25.00    Pack years: 25.00    Types: Cigarettes    Quit date: 09/01/2006    Years since quitting: 12.9  . Smokeless tobacco: Never Used  Substance Use Topics  . Alcohol use: No  . Drug use: No   Current Outpatient Medications  Medication Sig Dispense Refill  . ALPRAZolam (XANAX) 0.5 MG tablet Take 0.5 mg by mouth 2 (two) times daily as needed.    Marland Kitchen aspirin 81 MG chewable tablet Chew 1 tablet (81 mg total) by mouth 2 (two) times daily. (Patient taking differently: Chew 81 mg by mouth daily. ) 30 tablet 0  . azelastine (ASTELIN) 0.1 % nasal spray Place 2 sprays into both nostrils 2 (two) times daily. Use in each nostril as directed    . Biotin 1 MG CAPS Take 1 mg by mouth daily.    Marland Kitchen BREO ELLIPTA 200-25 MCG/INH AEPB 1 puff daily.    . Dulaglutide (TRULICITY) 1.5 0000000 SOPN Inject 1.5 mg into the skin every Thursday.     . empagliflozin  (JARDIANCE) 25 MG TABS tablet Take 25 mg by mouth daily.    Marland Kitchen escitalopram (LEXAPRO) 20 MG tablet Take 20 mg by mouth at bedtime.     . Evolocumab (REPATHA SURECLICK) [id number] MG/ML SOAJ Inject 1 pen into the skin every 14 (fourteen) days. 2 pen 11  . fluticasone (FLONASE) 50 MCG/ACT nasal spray Place 2 sprays into both nostrils 2 (two) times daily.    Marland Kitchen ibuprofen (ADVIL) 800 MG tablet Take 800 mg by mouth as needed.     Marland Kitchen ipratropium (ATROVENT) 0.06 % nasal spray     . levocetirizine (XYZAL) 5 MG tablet Take  5 mg by mouth every evening.    . metFORMIN (GLUCOPHAGE) 500 MG tablet Take 1,000 mg by mouth daily with supper.     . montelukast (SINGULAIR) 10 MG tablet Take 10 mg by mouth at bedtime.    . pantoprazole (PROTONIX) 40 MG tablet Take 40 mg by mouth 2 (two) times daily.     Marland Kitchen tiZANidine (ZANAFLEX) 4 MG tablet Take 1 tablet (4 mg total) by mouth every 6 (six) hours as needed. 40 tablet 1  . topiramate (TOPAMAX) 100 MG tablet Take 100 mg by mouth at bedtime.     . ursodiol (ACTIGALL) 300 MG capsule Take 300 mg by mouth 3 (three) times daily.      No current facility-administered medications for this visit.   Allergies  Allergen Reactions  . Invokana [Canagliflozin]     VAGINAL YEAST INFECTIONS  . Statins   . Versed [Midazolam]     "Makes my mouth run"  . Zetia [Ezetimibe] Cough  . Bupropion Hcl Rash     Review of Systems: All systems reviewed and negative except where noted in HPI.    US Abdomen Limited RUQ  Result Date: 07/17/2019 CLINICAL DATA:  Right upper quadrant pain EXAM: ULTRASOUND ABDOMEN LIMITED RIGHT UPPER QUADRANT COMPARISON:  None. FINDINGS: Gallbladder: No gallstones or wall thickening visualized. No sonographic Murphy sign noted by sonographer. Common bile duct: Diameter: Normal caliber, 5 mm Liver: Increased echotexture compatible with fatty infiltration. No focal abnormality or biliary ductal dilatation. Portal vein is patent on color Doppler imaging with normal  direction of blood flow towards the liver. Other: None. IMPRESSION: Fatty infiltration of the liver. No acute findings. Electronically Signed   By: Rolm Baptise M.D.   On: 07/17/2019 09:38   Lab Results  Component Value Date   WBC 8.4 03/19/2019   HGB 12.9 03/19/2019   HCT 41.9 03/19/2019   MCV 85.5 03/19/2019   PLT 339 03/19/2019    Lab Results  Component Value Date   ALT 31 12/16/2018   AST 19 12/16/2018   ALKPHOS 102 12/16/2018   BILITOT 0.3 12/16/2018    Lab Results  Component Value Date   CREATININE 0.73 03/19/2019   BUN 22 03/19/2019   NA 138 03/19/2019   K 4.1 03/19/2019   CL 107 03/19/2019   CO2 24 03/19/2019     Physical Exam: BP 106/78   Pulse 84   Temp 98.3 F (36.8 C)   Ht 5\' 3"  (1.6 m)   Wt 187 lb 6 oz (85 kg)   BMI 33.19 kg/m  Constitutional: Pleasant,well-developed, female in no acute distress. HEENT: Normocephalic and atraumatic. Conjunctivae are normal. No scleral icterus. Neck supple.  Cardiovascular: Normal rate, regular rhythm.  Pulmonary/chest: Effort normal and breath sounds normal. No wheezing, rales or rhonchi. Abdominal: Soft, nondistended, RUQ TTP underneath costal margin, (+) Carnett sign RUQ.  There are no masses palpable. Extremities: no edema Lymphadenopathy: No cervical adenopathy noted. Neurological: Alert and oriented to person place and time. Skin: Skin is warm and dry. No rashes noted. Psychiatric: Normal mood and affect. Behavior is normal.   ASSESSMENT AND PLAN: 69 year old female here for new patient assessment the following:  GERD - longstanding reflux symptoms with recent worsening despite twice daily PPI.  I discussed options with her.  She has never had a prior endoscopy and I think that would be a good idea to assess for a large hiatal hernia and complications from reflux disease given her longstanding symptoms.  I discussed risk  and benefits of EGD and anesthesia and following this discussion she wanted to proceed.  In  the interim I recommend she take her Protonix 30-60 minutes before eating a meal, she is currently not doing that.  I will also give her some Carafate to take at night prior to bed to see if that will help minimize her symptoms.  Pending results of EGD will discuss options for therapy. She agreed with the plan  RUQ pain - intermittent pain in the right upper quadrant that is somewhat positional, otherwise without other clear triggers.  Her ultrasound shows no gallstones and she has no postprandial complaints.  This would seem atypical for biliary colic.  She has a positive Carnett's sign in clinic, this could be musculoskeletal in nautre.  That being said we will clear her upper tract with the EGD, further recommendations pending the result.  Fatty liver / ? PBC ? - she does have fatty liver on recent ultrasound without evidence of cirrhosis.  I reviewed her prior liver biopsy from 2014 which specifically states there was no evidence of PBC.  Sounds like she may have had a positive AMA at her other physicians office, but unclear if he truly has PBC based off limited work-up I can see right now.  I will request the records from her [facility] to clarify this history, and determine if she really needs longstanding ursodiol.  She agreed  Colon cancer screening - due for screening colonoscopy in the next few months.  She wants to proceed with optical colonoscopy, will perform now at the time of her endoscopy to prevent her from needing multiple visits to get this done. She agreed  I spent 45 minutes of time, including in depth chart review, independent review of results as outlined above, communicating results with the patient directly, face-to-face time with the patient, coordinating care, ordering studies and medications as appropriate, and documenting this encounter.   Berthoud Cellar, MD [facility]  CC: Benard Halsted, [place]*

## 2019-07-30 NOTE — Telephone Encounter (Signed)
Natasha Franco from [facility] with a prior authorization of Evolocumab (REPATHA SURECLICK) [id number] MG/ML SOAJ. She can be reached at [phone] ref [id number]

## 2019-08-01 ENCOUNTER — Other Ambulatory Visit: Payer: Self-pay | Admitting: Gastroenterology

## 2019-08-01 ENCOUNTER — Telehealth: Payer: Self-pay | Admitting: Gastroenterology

## 2019-08-01 ENCOUNTER — Ambulatory Visit (INDEPENDENT_AMBULATORY_CARE_PROVIDER_SITE_OTHER): Payer: Medicare PPO

## 2019-08-01 DIAGNOSIS — Z1159 Encounter for screening for other viral diseases: Secondary | ICD-10-CM

## 2019-08-01 NOTE — Telephone Encounter (Signed)
I received the records from Dr. Kandis Nab office from 08/2016.  He reports she has a history of PBC, but I don't see history of how this diagnosis was made. The records are quite limited and unable to gather much from her history at all from this documentation.  Jan can you let the patient know I got the records from her most recent office visits but it remains unclear to me if she has [place] or how this diagnosis was made. She should continue her medications, I will discuss it with her when I see her again for her endoscopic procedures. She may want to call Dr. Charisse Klinefelter and ask about this at some point. Thanks

## 2019-08-04 LAB — SARS CORONAVIRUS 2 (TAT 6-24 HRS): SARS Coronavirus 2: NEGATIVE

## 2019-08-04 NOTE — Telephone Encounter (Signed)
Called and spoke to pt.  And let her know Dr. Doyne Keel remarks.  She will contact Dr. Kandis Nab office and ask them to fax over more specific information regarding her Dx of PBC.  She believes this was around 2014.

## 2019-08-05 ENCOUNTER — Ambulatory Visit (AMBULATORY_SURGERY_CENTER): Payer: Medicare PPO | Admitting: Gastroenterology

## 2019-08-05 ENCOUNTER — Other Ambulatory Visit: Payer: Self-pay

## 2019-08-05 ENCOUNTER — Encounter: Payer: Self-pay | Admitting: Gastroenterology

## 2019-08-05 VITALS — BP 104/55 | HR 62 | Temp 96.2°F | Resp 12 | Ht 63.0 in | Wt 187.0 lb

## 2019-08-05 DIAGNOSIS — K635 Polyp of colon: Secondary | ICD-10-CM

## 2019-08-05 DIAGNOSIS — K3189 Other diseases of stomach and duodenum: Secondary | ICD-10-CM

## 2019-08-05 DIAGNOSIS — K219 Gastro-esophageal reflux disease without esophagitis: Secondary | ICD-10-CM

## 2019-08-05 DIAGNOSIS — K2270 Barrett's esophagus without dysplasia: Secondary | ICD-10-CM

## 2019-08-05 DIAGNOSIS — Z1211 Encounter for screening for malignant neoplasm of colon: Secondary | ICD-10-CM

## 2019-08-05 DIAGNOSIS — D123 Benign neoplasm of transverse colon: Secondary | ICD-10-CM

## 2019-08-05 DIAGNOSIS — K2950 Unspecified chronic gastritis without bleeding: Secondary | ICD-10-CM

## 2019-08-05 DIAGNOSIS — K449 Diaphragmatic hernia without obstruction or gangrene: Secondary | ICD-10-CM

## 2019-08-05 DIAGNOSIS — D124 Benign neoplasm of descending colon: Secondary | ICD-10-CM

## 2019-08-05 MED ORDER — SODIUM CHLORIDE 0.9 % IV SOLN: 500.0000 mL | Freq: Once | INTRAVENOUS | Status: DC

## 2019-08-05 NOTE — Progress Notes (Signed)
PT taken to PACU. Monitors in place. VSS. Report given to RN. 

## 2019-08-05 NOTE — Progress Notes (Signed)
Called to room to assist during endoscopic procedure.  Patient ID and intended procedure confirmed with present staff. Received instructions for my participation in the procedure from the performing physician.  

## 2019-08-05 NOTE — Op Note (Signed)
Natasha Franco: Natasha Franco Procedure Date: 08/05/2019 9:09 AM MRN: [id number] Endoscopist: Remo Lipps P. Havery Moros , MD Age: 69 Referring MD:  Date of Birth: 1950-07-17 Gender: Female Account #: [id number] Procedure:                Upper GI endoscopy Indications:              longstanding gastro-esophageal reflux disease with                            worsening symptoms on PPI, history of upper                            abdominal pain, now on protonix 40mg  twice daily                            with some improvement of symptoms Medicines:                Monitored Anesthesia Care Procedure:                Pre-Anesthesia Assessment:                           - Prior to the procedure, a History and Physical                            was performed, and patient medications and                            allergies were reviewed. The patient's tolerance of                            previous anesthesia was also reviewed. The risks                            and benefits of the procedure and the sedation                            options and risks were discussed with the patient.                            All questions were answered, and informed consent                            was obtained. Prior Anticoagulants: The patient has                            taken no previous anticoagulant or antiplatelet                            agents. ASA Grade Assessment: II - A patient with                            mild systemic disease. After reviewing the risks  and benefits, the patient was deemed in                            satisfactory condition to undergo the procedure.                           After obtaining informed consent, the endoscope was                            passed under direct vision. Throughout the                            procedure, the patient's blood pressure, pulse, and                            oxygen saturations were  monitored continuously. The                            Endoscope was introduced through the mouth, and                            advanced to the second part of duodenum. The upper                            GI endoscopy was accomplished without difficulty.                            The patient tolerated the procedure well. Scope In: Scope Out: Findings:                 Esophagogastric landmarks were identified: the                            Z-line was found at 32 cm, the gastroesophageal                            junction was found at 33 cm and the upper extent of                            the gastric folds was found at 36 cm from the                            incisors.                           A 3 cm hiatal hernia was present.                           There were esophageal mucosal changes classified as                            Barrett's stage C0-M1 per Prague criteria present  in the lower third of the esophagus (2 tongues of                            salmon colored mucosa). The maximum longitudinal                            extent of these mucosal changes was roughly 1 cm in                            length. Biopsies obtained.                           The exam of the esophagus was otherwise normal.                           The entire examined stomach was normal. Biopsies                            were taken with a cold forceps for Helicobacter                            pylori testing.                           The duodenal bulb and second portion of the                            duodenum were normal. Complications:            No immediate complications. Estimated blood loss:                            Minimal. Estimated Blood Loss:     Estimated blood loss was minimal. Impression:               - Esophagogastric landmarks identified.                           - 3 cm hiatal hernia.                           - Esophageal mucosal changes  classified as                            Barrett's stage C0-M1 per Prague criteria. Biopsies                            obtained.                           - Normal stomach. Biopsied.                           - Normal duodenal bulb and second portion of the  duodenum. Recommendation:           - Patient has a contact number available for                            emergencies. The signs and symptoms of potential                            delayed complications were discussed with the                            patient. Return to normal activities tomorrow.                            Written discharge instructions were provided to the                            patient.                           - Resume previous diet.                           - Continue present medications.                           - Await pathology results. Remo Lipps P. [name], MD 08/05/2019 9:59:44 AM This report has been signed electronically.

## 2019-08-05 NOTE — Op Note (Signed)
[facility] Patient Name: Natasha Franco Procedure Date: 08/05/2019 9:08 AM MRN: [id number] Endoscopist: Remo Lipps P. Havery Moros , MD Age: 69 Referring MD:  Date of Birth: 1951-01-06 Gender: Female Account #: [id number] Procedure:                Colonoscopy Indications:              Screening for colorectal malignant neoplasm Medicines:                Monitored Anesthesia Care Procedure:                Pre-Anesthesia Assessment:                           - Prior to the procedure, a History and Physical                            was performed, and patient medications and                            allergies were reviewed. The patient's tolerance of                            previous anesthesia was also reviewed. The risks                            and benefits of the procedure and the sedation                            options and risks were discussed with the patient.                            All questions were answered, and informed consent                            was obtained. Prior Anticoagulants: The patient has                            taken no previous anticoagulant or antiplatelet                            agents. ASA Grade Assessment: II - A patient with                            mild systemic disease. After reviewing the risks                            and benefits, the patient was deemed in                            satisfactory condition to undergo the procedure.                           After obtaining informed consent, the colonoscope  was passed under direct vision. Throughout the                            procedure, the patient's blood pressure, pulse, and                            oxygen saturations were monitored continuously. The                            Colonoscope was introduced through the anus and                            advanced to the the cecum, identified by                            appendiceal orifice and  ileocecal valve. The                            colonoscopy was performed without difficulty. The                            patient tolerated the procedure well. The quality                            of the bowel preparation was good. The ileocecal                            valve, appendiceal orifice, and rectum were                            photographed. Scope In: 9:26:56 AM Scope Out: 9:47:45 AM Scope Withdrawal Time: 0 hours 18 minutes 3 seconds  Total Procedure Duration: 0 hours 20 minutes 49 seconds  Findings:                 The perianal and digital rectal examinations were                            normal.                           A 3 mm polyp was found in the transverse colon. The                            polyp was sessile. The polyp was removed with a                            cold snare. Resection and retrieval were complete.                           A 8 mm polyp was found in the descending colon. The                            polyp was flat. The polyp was removed with a cold  snare. Resection and retrieval were complete.                           Multiple small-mouthed diverticula were found in                            the sigmoid colon and ascending colon (mild in                            right colon, multiple noted in the left colon with                            superficial erythema).                           Internal hemorrhoids were found during retroflexion.                           The exam was otherwise without abnormality. Complications:            No immediate complications. Estimated blood loss:                            Minimal. Estimated Blood Loss:     Estimated blood loss was minimal. Impression:               - One 3 mm polyp in the transverse colon, removed                            with a cold snare. Resected and retrieved.                           - One 10 mm polyp in the descending colon, removed                             with a cold snare. Resected and retrieved.                           - Diverticulosis in the sigmoid colon and in the                            ascending colon.                           - Internal hemorrhoids.                           - The examination was otherwise normal. Recommendation:           - Patient has a contact number available for                            emergencies. The signs and symptoms of potential                            delayed complications were discussed with  the                            patient. Return to normal activities tomorrow.                            Written discharge instructions were provided to the                            patient.                           - Resume previous diet.                           - Continue present medications.                           - Await pathology results. Remo Lipps P. Zayvian Mcmurtry, MD 08/05/2019 9:53:34 AM This report has been signed electronically.

## 2019-08-05 NOTE — Patient Instructions (Signed)
YOU HAD AN ENDOSCOPIC PROCEDURE TODAY AT [facility]:   Refer to the procedure report that was given to you for any specific questions about what was found during the examination.  If the procedure report does not answer your questions, please call your gastroenterologist to clarify.  If you requested that your care partner not be given the details of your procedure findings, then the procedure report has been included in a sealed envelope for you to review at your convenience later.  ** Handout given on polyps, diverticulosis and hemorrhoids**  YOU SHOULD EXPECT: Some feelings of bloating in the abdomen. Passage of more gas than usual.  Walking can help get rid of the air that was put into your GI tract during the procedure and reduce the bloating. If you had a lower endoscopy (such as a colonoscopy or flexible sigmoidoscopy) you may notice spotting of blood in your stool or on the toilet paper. If you underwent a bowel prep for your procedure, you may not have a normal bowel movement for a few days.  Please Note:  You might notice some irritation and congestion in your nose or some drainage.  This is from the oxygen used during your procedure.  There is no need for concern and it should clear up in a day or so.  SYMPTOMS TO REPORT IMMEDIATELY:   Following lower endoscopy (colonoscopy or flexible sigmoidoscopy):  Excessive amounts of blood in the stool  Significant tenderness or worsening of abdominal pains  Swelling of the abdomen that is new, acute  Fever of 100F or higher   Following upper endoscopy (EGD)  Vomiting of blood or coffee ground material  New chest pain or pain under the shoulder blades  Painful or persistently difficult swallowing  New shortness of breath  Fever of 100F or higher  Black, tarry-looking stools  For urgent or emergent issues, a gastroenterologist can be reached at any hour by calling [phone].   DIET:  We do recommend a small meal  at first, but then you may proceed to your regular diet.  Drink plenty of fluids but you should avoid alcoholic beverages for 24 hours.  ACTIVITY:  You should plan to take it easy for the rest of today and you should NOT DRIVE or use heavy machinery until tomorrow (because of the sedation medicines used during the test).    FOLLOW UP: Our staff will call the number listed on your records 48-72 hours following your procedure to check on you and address any questions or concerns that you may have regarding the information given to you following your procedure. If we do not reach you, we will leave a message.  We will attempt to reach you two times.  During this call, we will ask if you have developed any symptoms of COVID 19. If you develop any symptoms (ie: fever, flu-like symptoms, shortness of breath, cough etc.) before then, please call [phone].  If you test positive for Covid 19 in the 2 weeks post procedure, please call and report this information to [place].    If any biopsies were taken you will be contacted by phone or by letter within the next 1-3 weeks.  Please call us at [phone] if you have not heard about the biopsies in 3 weeks.    SIGNATURES/CONFIDENTIALITY: You and/or your care partner have signed paperwork which will be entered into your electronic medical record.  These signatures attest to the fact that that the information above on  your After Visit Summary has been reviewed and is understood.  Full responsibility of the confidentiality of this discharge information lies with you and/or your care-partner. 

## 2019-08-05 NOTE — Progress Notes (Signed)
Temp JB VS DT  Pt's states no medical or surgical changes since previsit or office visit. 

## 2019-08-07 ENCOUNTER — Telehealth: Payer: Self-pay

## 2019-08-07 NOTE — Telephone Encounter (Signed)
NO ANSWER, MESSAGE LEFT FOR PATIENT. 

## 2019-08-07 NOTE — Telephone Encounter (Signed)
  Follow up Call-  Call back number 08/05/2019  Post procedure Call Back phone  # [phone]  Permission to leave phone message Yes  Some recent data might be hidden     Patient questions:  Do you have a fever, pain , or abdominal swelling? No. Pain Score  0 *  Have you tolerated food without any problems? Yes.    Have you been able to return to your normal activities? Yes.    Do you have any questions about your discharge instructions: Diet   No. Medications  No. Follow up visit  No.  Do you have questions or concerns about your Care? No.  Actions: * If pain score is 4 or above: No action needed, pain <4.   1. Have you developed a fever since your procedure? No  2.   Have you had an respiratory symptoms (SOB or cough) since your procedure? No  3.   Have you tested positive for COVID 19 since your procedure? No  4.   Have you had any family members/close contacts diagnosed with the COVID 19 since your procedure?  No   If yes to any of these questions please route to Joylene John, RN and Alphonsa Gin, RN.

## 2019-08-07 NOTE — Telephone Encounter (Signed)
Natasha Franco  This one yours/ours?

## 2019-08-11 ENCOUNTER — Telehealth: Payer: Self-pay | Admitting: Pharmacist

## 2019-08-11 NOTE — Telephone Encounter (Signed)
Appeals letter for [place] faxed to [employer] 08/11/19

## 2019-08-13 NOTE — Telephone Encounter (Addendum)
Appeals overturned - [place] approved through 07/09/20. Left message for pt to make her aware.

## 2019-08-18 ENCOUNTER — Telehealth: Payer: Self-pay

## 2019-08-18 NOTE — Telephone Encounter (Signed)
Records from [facility] received and placed on Dr. Doyne Keel desk for review.

## 2019-08-21 ENCOUNTER — Telehealth: Payer: Self-pay | Admitting: Gastroenterology

## 2019-08-21 NOTE — Telephone Encounter (Signed)
Records arrived from the patient's prior GI provider. She had a elevation in AP to the 200s, and markedly elevated AMA. She underwent a liver biopsy which showed some nonspecific inflammation, pathology wrote they did not see any evidence of obvious PBC. Given her labs she was placed on Ursodiol however and her AP had normalized.   Unclear if she has PBC, she should continue her meds for now and I would like to see her for a routine follow up to discuss options moving forward, if you can help coordinate. Thanks

## 2019-08-22 NOTE — Telephone Encounter (Signed)
09-18-2019 follow up has been scheduled.

## 2019-08-22 NOTE — Telephone Encounter (Signed)
Left message to return call to schedule

## 2019-09-18 ENCOUNTER — Ambulatory Visit (INDEPENDENT_AMBULATORY_CARE_PROVIDER_SITE_OTHER)
Admission: RE | Admit: 2019-09-18 | Discharge: 2019-09-18 | Disposition: A | Discharge status: Home / Self Care | Payer: Medicare PPO | Source: Ambulatory Visit | Attending: Gastroenterology | Admitting: Gastroenterology

## 2019-09-18 ENCOUNTER — Other Ambulatory Visit: Payer: Self-pay

## 2019-09-18 ENCOUNTER — Other Ambulatory Visit: Payer: Self-pay | Admitting: Medical

## 2019-09-18 ENCOUNTER — Ambulatory Visit: Payer: Medicare PPO | Admitting: Gastroenterology

## 2019-09-18 ENCOUNTER — Encounter: Payer: Self-pay | Admitting: Gastroenterology

## 2019-09-18 VITALS — BP 102/60 | HR 78 | Temp 98.1°F | Ht 63.0 in | Wt 180.0 lb

## 2019-09-18 DIAGNOSIS — K743 Primary biliary cirrhosis: Secondary | ICD-10-CM

## 2019-09-18 DIAGNOSIS — K2270 Barrett's esophagus without dysplasia: Secondary | ICD-10-CM | POA: Diagnosis not present

## 2019-09-18 DIAGNOSIS — R1011 Right upper quadrant pain: Secondary | ICD-10-CM

## 2019-09-18 DIAGNOSIS — K449 Diaphragmatic hernia without obstruction or gangrene: Secondary | ICD-10-CM

## 2019-09-18 DIAGNOSIS — K760 Fatty (change of) liver, not elsewhere classified: Secondary | ICD-10-CM

## 2019-09-18 DIAGNOSIS — Z1231 Encounter for screening mammogram for malignant neoplasm of breast: Secondary | ICD-10-CM

## 2019-09-18 MED ORDER — SUCRALFATE 1 GM/10ML PO SUSP: 1.0000 g | Freq: Four times a day (QID) | ORAL | 2 refills | Status: DC | PRN

## 2019-09-18 NOTE — Progress Notes (Signed)
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  Please see prior notes for full details of her intake history.  Since her last visit she underwent an endoscopy and colonoscopy with me as outlined below: EGD 08/05/19 -  - A 3 cm hiatal hernia was present. - There were esophageal mucosal changes classified as Barrett's stage C0-M1 per [place] criteria present in the lower third of the esophagus (2 tongues of salmon colored mucosa). The maximum longitudinal extent of these mucosal changes was roughly 1 cm in length. Biopsies obtained. - The exam of the esophagus was otherwise normal. - The entire examined stomach was normal. Biopsies were taken with a cold forceps for Helicobacter pylori testing. - The duodenal bulb and second portion of the duodenum were normal.  Path shows BE  Colonoscopy 08/05/19 - The perianal and digital rectal examinations were normal. - A 3 mm polyp was found in the transverse colon. The polyp was sessile. The polyp was removed with a cold snare. Resection and retrieval were complete. - A 8 mm polyp was found in the descending colon. The polyp was flat. The polyp was removed with a cold snare. Resection and retrieval were complete. - Multiple small-mouthed diverticula were found in the sigmoid colon and ascending colon (mild in right colon, multiple noted in the left colon with superficial erythema). - Internal hemorrhoids were found during retroflexion. - The exam was otherwise without abnormality.  Benign path - no adenoma  She is been doing well since her last visit.  She has been on Protonix 40 mg twice a day and using Carafate as needed for breakthrough reflux.  She states this regimen has been working pretty well for her.  She is happy with the Carafate.  No dysphagia.  We discussed long-term options for management of reflux as outlined below.  She continues to have periodic right  upper quadrant pain.  This is been ongoing for years. She states this occurs randomly without clear triggers, she does think there is some positional component to it.    When she reaches or bends over to get something it can bother her.  She denies any postprandial abdominal pain that bothers her in this regard.  She has seen Dr. Hassell Done about this.  She had a recent ultrasound which showed normal gallbladder without any gallstones.  She otherwise carries a diagnosis of PBC.  I obtained her records after her last visit to clarify how this was diagnosed.  In 2014 she had a persistent elevation in AP to the 200s, and markedly elevated AMA. She underwent a liver biopsy as below which showed some nonspecific inflammation, pathology wrote they did not see any evidence of obvious PBC. Given her labs she was placed on Ursodiol however and her AP had normalized.  She has been taking ursodiol since then and tolerates it well.  Her liver enzymes have remained normal.  Her liver ultrasound shows no evidence of cirrhosis, but some evidence of fatty liver.  She does not think she has ever had a DEXA scan.  Recent TSH and liver function testing through her primary care from February are normal.  She denies any family history of esophageal cancer, gastric cancer, or colon cancer.  Prior workup: Colonoscopy 01/31/2010 - tortous colon, 2 sigmoid polyps, internal hemorrhoids - Dr. Olevia Perches   Liver biopsy 02/2013 - Final Pathologic Diagnosis  CORE NEEDLE BIOPSIES OF LIVER:  1. CHRONIC HEPATITIS, TYPE UNDETERMINED.  2. SCHEUER  GRADE: 2, STAGE: 0  3. MILD FATTY CHANGES.  4. THERE IS NO SIGNIFICANT INCREASE IN PERIPORTAL FIBROUS CONNECTIVE TISSUE.  5. NEGATIVE FOR A NEOPLASM.  6. IRON STAIN IS NEGATIVE FOR PARENCHYMAL IRON.  7. PLEASE SEE COMMENT.     [id number], [id number] x 3    Comment  The differential diagnosis includes non-alcoholic steatohepatitis (NASH),  alcohol related  chronic hepatitis, drug induced hepatitis, autoimmune hepatitis,  and chronic viral hepatitis.Results of any laboratory studies and clinical  history could not be reviewed in the EPIC system at the time of review of the  slides.There are no features of cirrhosis in the biopsies.The etiology of  the hepatitis should be determined by clinical picture and results of various  laboratory studies. The pattern is not suggestive of primary biliary cirrhosis.   RUQ [place] 07/17/19 -  IMPRESSION: Fatty infiltration of the liver.  No acute findings       Past Medical History:  Diagnosis Date  . Arthritis   . Barrett's esophagus   . Complication of anesthesia   . Diabetes mellitus without complication ([place])   . GERD (gastroesophageal reflux disease)   . Hyperlipidemia    Per pt  . Migraines   . 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 ABDOMINAL HYSTERECTOMY    . BREAST BIOPSY Left 2014  . BREAST SURGERY Bilateral 1984   reduction mammoplasty  . CARPAL TUNNEL RELEASE    . CESAREAN SECTION    . JOINT REPLACEMENT    . REDUCTION MAMMAPLASTY Bilateral   . ROTATOR CUFF REPAIR    . TONSILLECTOMY    . TOTAL KNEE ARTHROPLASTY Left 03/25/2019   Procedure: Left Knee Arthroplasty;  Surgeon: Melrose Nakayama, MD;  Location: [facility];  Service: Orthopedics;  Laterality: Left;  Marland Kitchen VAGINAL HYSTERECTOMY     Family History  Problem Relation Age of Onset  . Arthritis Mother   . COPD Father   . Diabetes Mellitus II Father   . Colon cancer Neg Hx   . Liver cancer Neg Hx   . Stomach cancer Neg Hx   . Esophageal cancer Neg Hx   . Rectal cancer Neg Hx    Social History   Tobacco Use  . Smoking status: Former Smoker    Packs/day: 1.00    Years: 25.00    Pack years: 25.00    Types: Cigarettes    Quit date: 09/01/2006    Years since quitting: 13.0  . Smokeless tobacco: Never Used   Substance Use Topics  . Alcohol use: Yes    Comment: occ  . Drug use: No   Current Outpatient Medications  Medication Sig Dispense Refill  . ALPRAZolam (XANAX) 0.5 MG tablet Take 0.5 mg by mouth 2 (two) times daily as needed.    Marland Kitchen aspirin 81 MG chewable tablet Chew 1 tablet (81 mg total) by mouth 2 (two) times daily. (Patient taking differently: Chew 81 mg by mouth daily. ) 30 tablet 0  . azelastine (ASTELIN) 0.1 % nasal spray Place 2 sprays into both nostrils 2 (two) times daily. Use in each nostril as directed    . Biotin 1 MG CAPS Take 1 mg by mouth daily.    Marland Kitchen BREO ELLIPTA 200-25 MCG/INH AEPB 1 puff daily.    . Dulaglutide (TRULICITY) 1.5 0000000 SOPN Inject 3 mg into the skin every Thursday.     . empagliflozin (JARDIANCE)  25 MG TABS tablet Take 25 mg by mouth daily.    Marland Kitchen escitalopram (LEXAPRO) 20 MG tablet Take 20 mg by mouth at bedtime.     . Evolocumab (REPATHA SURECLICK) XX123456 MG/ML SOAJ Inject 1 pen into the skin every 14 (fourteen) days. 2 pen 11  . fluticasone (FLONASE) 50 MCG/ACT nasal spray Place 2 sprays into both nostrils 2 (two) times daily.    Marland Kitchen ibuprofen (ADVIL) 800 MG tablet Take 800 mg by mouth as needed.     Marland Kitchen ipratropium (ATROVENT) 0.06 % nasal spray     . levocetirizine (XYZAL) 5 MG tablet Take 5 mg by mouth every evening.    . metFORMIN (GLUCOPHAGE) 500 MG tablet Take 1,000 mg by mouth daily with supper.     . montelukast (SINGULAIR) 10 MG tablet Take 10 mg by mouth at bedtime.    . pantoprazole (PROTONIX) 40 MG tablet Take 1 tablet (40 mg total) by mouth 2 (two) times daily before a meal. Take 30 minutes before meals 180 tablet 1  . sucralfate (CARAFATE) 1 GM/10ML suspension Take 10 mLs (1 g total) by mouth every 6 (six) hours as needed. Be sure to take at bedtime 420 mL 2  . tiZANidine (ZANAFLEX) 4 MG tablet Take 1 tablet (4 mg total) by mouth every 6 (six) hours as needed. 40 tablet 1  . topiramate (TOPAMAX) 100 MG tablet Take 100 mg by mouth at bedtime.     .  ursodiol (ACTIGALL) 300 MG capsule Take 300 mg by mouth 3 (three) times daily.      No current facility-administered medications for this visit.   Allergies  Allergen Reactions  . Invokana [Canagliflozin]     VAGINAL YEAST INFECTIONS  . Statins   . Versed [Midazolam]     "Makes my mouth run"  . Zetia [Ezetimibe] Cough  . Bupropion Hcl Rash     Review of Systems: All systems reviewed and negative except where noted in HPI.    Labs per HPI.   Lab Results  Component Value Date   ALT 31 12/16/2018   AST 19 12/16/2018   ALKPHOS 102 12/16/2018   BILITOT 0.3 12/16/2018    Physical Exam: BP 102/60   Pulse 78   Temp 98.1 F (36.7 C)   Ht 5\' 3"  (1.6 m)   Wt 180 lb (81.6 kg)   BMI 31.89 kg/m  Constitutional: Pleasant,well-developed, female in no acute distress. Abdominal: Soft, nondistended, nontender.  There are no masses palpable. No hepatomegaly. Extremities: no edema Lymphadenopathy: No cervical adenopathy noted. Neurological: Alert and oriented to person place and time. Skin: Skin is warm and dry. No rashes noted. Psychiatric: Normal mood and affect. Behavior is normal.   ASSESSMENT AND PLAN: 69 year old female here for reassessment of the following issues:  Barrett's esophagus / hiatal hernia / GERD - new diagnosis of Barrett's esophagus on recent EGD.  This is a short segment and we discussed what it is. This appears to be a low risk lesion for malignancy however she needs to have this surveyed every 3 to 5 years endoscopically.  She is in agreement with that.  Otherwise she does have a 3 cm hiatal hernia and unfortunately has required high-dose Protonix with Carafate as needed to control her symptoms.  We discussed options moving forward - continued medical therapy for her reflux versus surgical repair of the hernia with Nissen.  We discussed long-term risks and benefits of chronic PPI use, and risks of the surgery.  For now  she wishes to continue with medical  management, while we want her to use the lowest dose of Protonix needed to control symptoms it sounds like she does require twice daily dose to control things.  She will continue this for now and see me at least once yearly for this issue.  She is trying to lose weight which may also help this condition.  Her renal function is normal, we will check a DEXA scan for osteoporosis as outlined below.  We will plan on a repeat endoscopy in 3 years.  Right upper quadrant pain - intermittent pain in the right upper quadrant that is positional, otherwise without other clear triggers, no prandial relationship.  She has no evidence of gallstones on ultrasound.  Symptoms seem unlikely to be biliary colic.  Previously she had a positive Carnett's sign however I could not elicit the pain today.  I think this is more than likely musculoskeletal however for peace of mind and to make sure nothing else is going on I offered her CT scan of the abdomen pelvis.  She states given stability of symptoms and chronicity over several years, she is not really worried about it and declined CT at this time.  If things change and she wishes to have further evaluation she can let me know.   Suspected PBC / fatty liver - as above, she had a chronic alkaline phosphatase elevation in the setting of a markedly positive antimitochondrial antibody.  In 2014 she had a liver biopsy which showed some nonspecific inflammation but no definitive evidence of PBC.  Her provider at the time gave her some ursodiol and her liver enzymes have completely normalized and have remained normal over time on the regimen.  We discussed the situation, while biopsy was negative, could have been sampling error.  We also discussed that patients who have a positive antimitochondrial antibody who have a negative liver biopsy initially are at risk for developing PBC over time regardless.  We essentially discussed if she wanted to continue her ursodiol or not, if we are  treating for PBC this would be indefinite use.  Given Ursodiol normalized her liver enzymes and she tolerates it well, she understands the situation and wishes to continue it for now.  Given possible PBC I will recommend a DEXA scan to screen for osteoporosis.  TSH normal.  LFTs normal.  She is working on weight loss which will help the fatty liver and hopefully her reflux as well.  I spent 35 minutes of time, including in depth chart review, independent review of results as outlined above, communicating results with the patient directly, face-to-face time with the patient, coordinating care, and ordering studies and medications as appropriate, and documentation.  Aromas Cellar, MD [facility]

## 2019-09-18 NOTE — Patient Instructions (Addendum)
If you are age 68 or older, your body mass index should be between 23-30. Your Body mass index is 31.89 kg/m. If this is out of the aforementioned range listed, please consider follow up with your Primary Care Provider.  If you are age 41 or younger, your body mass index should be between 19-25. Your Body mass index is 31.89 kg/m. If this is out of the aformentioned range listed, please consider follow up with your Primary Care Provider.   You have been scheduled for a bone density test on 09-18-19 at Radiology on the basement floor of [place] location for this test. If you need to cancel or reschedule for any reason, please contact radiology at [phone].  Preparation for test is as follows:   If you are taking calcium, discontinue this 24-48 hours prior to your appointment.   Wear pants with an elastic waistband (or without any metal such as a zipper).   Do not wear an underwire bra.   We do have gowns if you are unable to find appropriate clothing without metal.   Please bring a list of all current medications.  We have sent the following medications to your pharmacy for you to pick up at your convenience: [place] will be due for an office visit in 1 year or sooner if needed. We will send you a reminder in the mail when it gets closer to that time.  Thank you for entrusting me with your care and for choosing [facility], Dr. [place] Cellar

## 2019-11-17 ENCOUNTER — Other Ambulatory Visit: Payer: Self-pay | Admitting: Pharmacist

## 2019-11-17 MED ORDER — REPATHA SURECLICK 140 MG/ML ~~LOC~~ SOAJ: 1.0000 "pen " | SUBCUTANEOUS | 11 refills | Status: DC

## 2019-12-19 ENCOUNTER — Other Ambulatory Visit: Payer: Self-pay

## 2019-12-19 ENCOUNTER — Ambulatory Visit
Admission: RE | Admit: 2019-12-19 | Discharge: 2019-12-19 | Disposition: A | Discharge status: Home / Self Care | Payer: Medicare PPO | Source: Ambulatory Visit | Attending: Medical | Admitting: Medical

## 2019-12-19 DIAGNOSIS — Z1231 Encounter for screening mammogram for malignant neoplasm of breast: Secondary | ICD-10-CM

## 2020-03-26 DIAGNOSIS — F419 Anxiety disorder, unspecified: Secondary | ICD-10-CM | POA: Insufficient documentation

## 2020-08-30 ENCOUNTER — Other Ambulatory Visit
Admission: RE | Admit: 2020-08-30 | Discharge: 2020-08-30 | Disposition: A | Discharge status: Home / Self Care | Payer: Medicare PPO | Source: Ambulatory Visit | Attending: Medical | Admitting: Medical

## 2020-08-30 DIAGNOSIS — E119 Type 2 diabetes mellitus without complications: Secondary | ICD-10-CM | POA: Insufficient documentation

## 2020-08-30 DIAGNOSIS — E7800 Pure hypercholesterolemia, unspecified: Secondary | ICD-10-CM | POA: Diagnosis present

## 2020-08-30 LAB — CBC WITH DIFFERENTIAL/PLATELET
Abs Immature Granulocytes: 0.02 10*3/uL (ref 0.00–0.07)
Basophils Absolute: 0.1 10*3/uL (ref 0.0–0.1)
Basophils Relative: 1 %
Eosinophils Absolute: 0.1 10*3/uL (ref 0.0–0.5)
Eosinophils Relative: 2 %
HCT: 44.6 % (ref 36.0–46.0)
Hemoglobin: 14.6 g/dL (ref 12.0–15.0)
Immature Granulocytes: 0 %
Lymphocytes Relative: 35 %
Lymphs Abs: 2.7 10*3/uL (ref 0.7–4.0)
MCH: 28.1 pg (ref 26.0–34.0)
MCHC: 32.7 g/dL (ref 30.0–36.0)
MCV: 85.8 fL (ref 80.0–100.0)
Monocytes Absolute: 0.6 10*3/uL (ref 0.1–1.0)
Monocytes Relative: 8 %
Neutro Abs: 4.2 10*3/uL (ref 1.7–7.7)
Neutrophils Relative %: 54 %
Platelets: 310 10*3/uL (ref 150–400)
RBC: 5.2 MIL/uL — ABNORMAL HIGH (ref 3.87–5.11)
RDW: 14.2 % (ref 11.5–15.5)
WBC: 7.6 10*3/uL (ref 4.0–10.5)
nRBC: 0 % (ref 0.0–0.2)

## 2020-08-30 LAB — COMPREHENSIVE METABOLIC PANEL
ALT: 39 U/L (ref 0–44)
AST: 29 U/L (ref 15–41)
Albumin: 4.1 g/dL (ref 3.5–5.0)
Alkaline Phosphatase: 121 U/L (ref 38–126)
Anion gap: 9 (ref 5–15)
BUN: 24 mg/dL — ABNORMAL HIGH (ref 8–23)
CO2: 22 mmol/L (ref 22–32)
Calcium: 9 mg/dL (ref 8.9–10.3)
Chloride: 104 mmol/L (ref 98–111)
Creatinine, Ser: 0.69 mg/dL (ref 0.44–1.00)
GFR, Estimated: >60 mL/min (ref >60)
Glucose, Bld: 106 mg/dL — ABNORMAL HIGH (ref 70–99)
Potassium: 4.1 mmol/L (ref 3.5–5.1)
Sodium: 135 mmol/L (ref 135–145)
Total Bilirubin: 0.8 mg/dL (ref 0.3–1.2)
Total Protein: 8 g/dL (ref 6.5–8.1)

## 2020-08-30 LAB — URINALYSIS, ROUTINE W REFLEX MICROSCOPIC
Bacteria, UA: NONE SEEN
Bilirubin Urine: NEGATIVE
Glucose, UA: >=500 mg/dL — AB
Ketones, ur: NEGATIVE mg/dL
Nitrite: NEGATIVE
Protein, ur: NEGATIVE mg/dL
Specific Gravity, Urine: 1.014 (ref 1.005–1.030)
pH: 5 (ref 5.0–8.0)

## 2020-08-30 LAB — LIPID PANEL
Cholesterol: 134 mg/dL (ref 0–200)
HDL: 54 mg/dL (ref >40)
LDL Cholesterol: 65 mg/dL (ref 0–99)
Total CHOL/HDL Ratio: 2.5 RATIO
Triglycerides: 75 mg/dL (ref <150)
VLDL: 15 mg/dL (ref 0–40)

## 2020-08-30 LAB — HEMOGLOBIN A1C
Hgb A1c MFr Bld: 6.4 % — ABNORMAL HIGH (ref 4.8–5.6)
Mean Plasma Glucose: 136.98 mg/dL

## 2020-10-01 ENCOUNTER — Other Ambulatory Visit: Payer: Medicare PPO

## 2020-11-05 ENCOUNTER — Other Ambulatory Visit: Payer: Self-pay | Admitting: Medical

## 2020-11-05 DIAGNOSIS — Z1231 Encounter for screening mammogram for malignant neoplasm of breast: Secondary | ICD-10-CM

## 2020-11-22 ENCOUNTER — Telehealth: Payer: Self-pay | Admitting: Interventional Cardiology

## 2020-11-22 DIAGNOSIS — E782 Mixed hyperlipidemia: Secondary | ICD-10-CM

## 2020-11-22 MED ORDER — REPATHA SURECLICK 140 MG/ML ~~LOC~~ SOAJ: 1.0000 "pen " | SUBCUTANEOUS | 0 refills | Status: DC

## 2020-11-22 NOTE — Telephone Encounter (Signed)
*  STAT* If patient is at the pharmacy, call can be transferred to refill team.   1. Which medications need to be refilled? (please list name of each medication and dose if known) Evolocumab (REPATHA SURECLICK) 161 MG/ML SOAJ  2. Which pharmacy/location (including street and city if local pharmacy) is medication to be sent to? [facility] [address], [place]  3. Do they need a 30 day or 90 day supply? [place]

## 2020-11-22 NOTE — Telephone Encounter (Signed)
Rx sent to pharmacy   

## 2020-11-23 NOTE — Telephone Encounter (Signed)
Patient made aware that Rx was sent in yesterday

## 2020-12-29 ENCOUNTER — Ambulatory Visit: Payer: Medicare PPO

## 2021-01-11 IMAGING — MG DIGITAL SCREENING BILAT W/ TOMO W/ CAD
8 series · 8 of 24 positions shown · non-contrast
Comparison: Previous exam(s).

CLINICAL DATA: Screening.

EXAM:
DIGITAL SCREENING BILATERAL MAMMOGRAM WITH TOMO AND CAD

[R CC synth-2D]
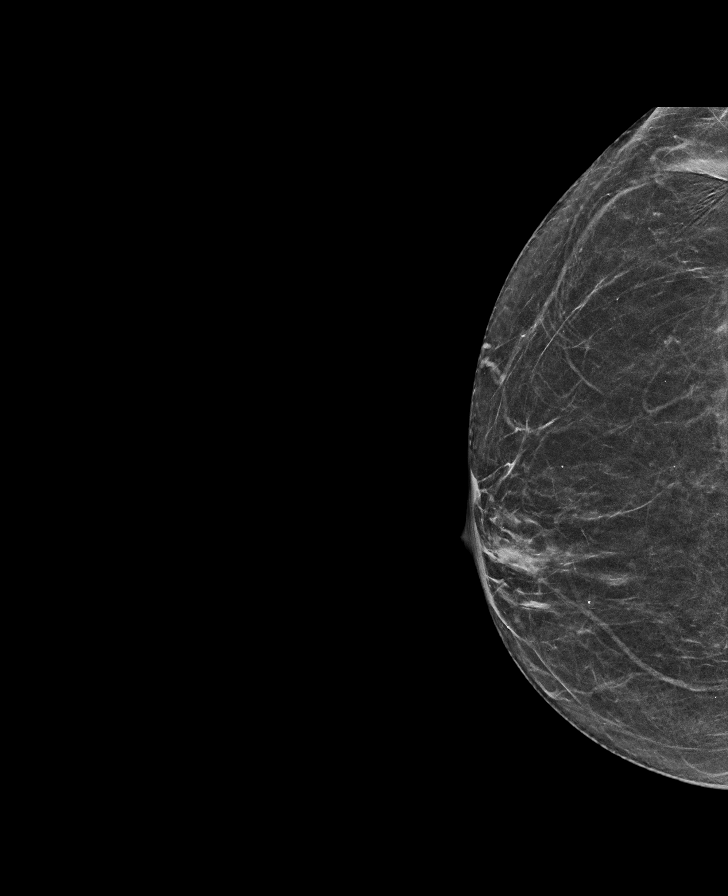

[R MLO synth-2D]
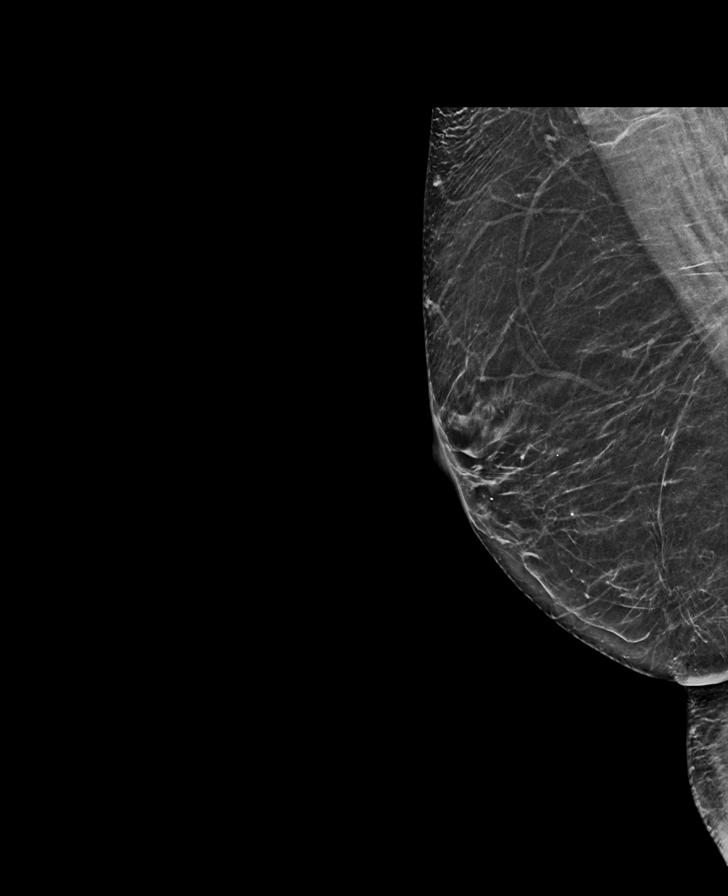

[L MLO synth-2D]
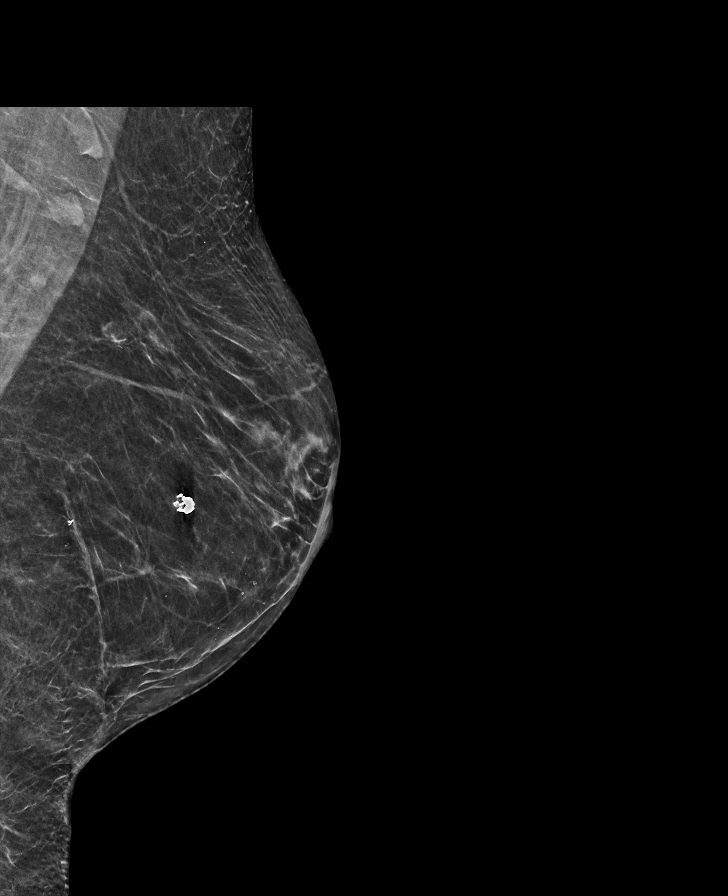

[L CC synth-2D]
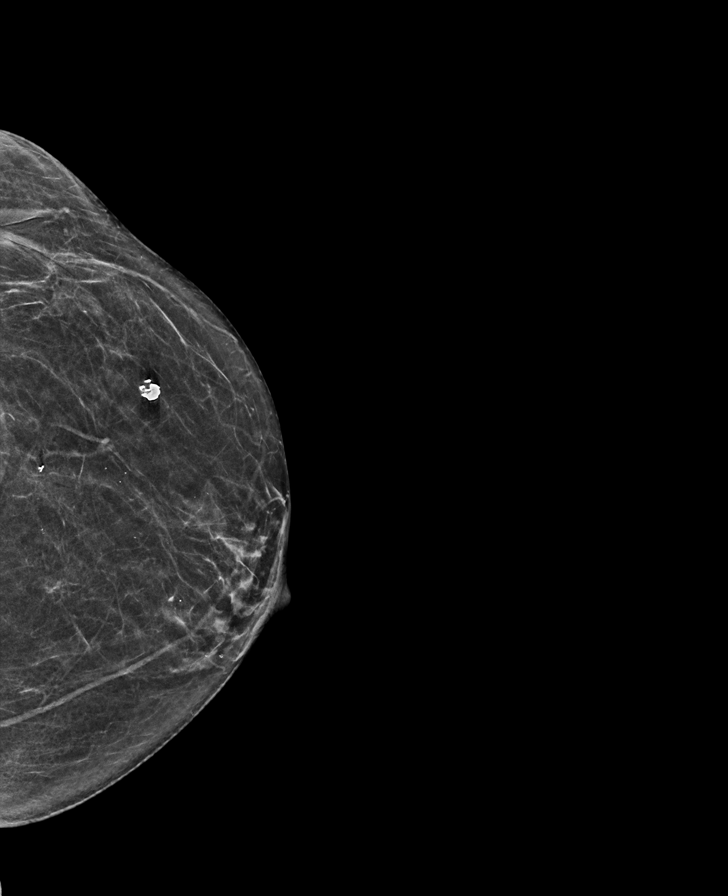

[L MLO tomo · tomo slice 29/57.0]
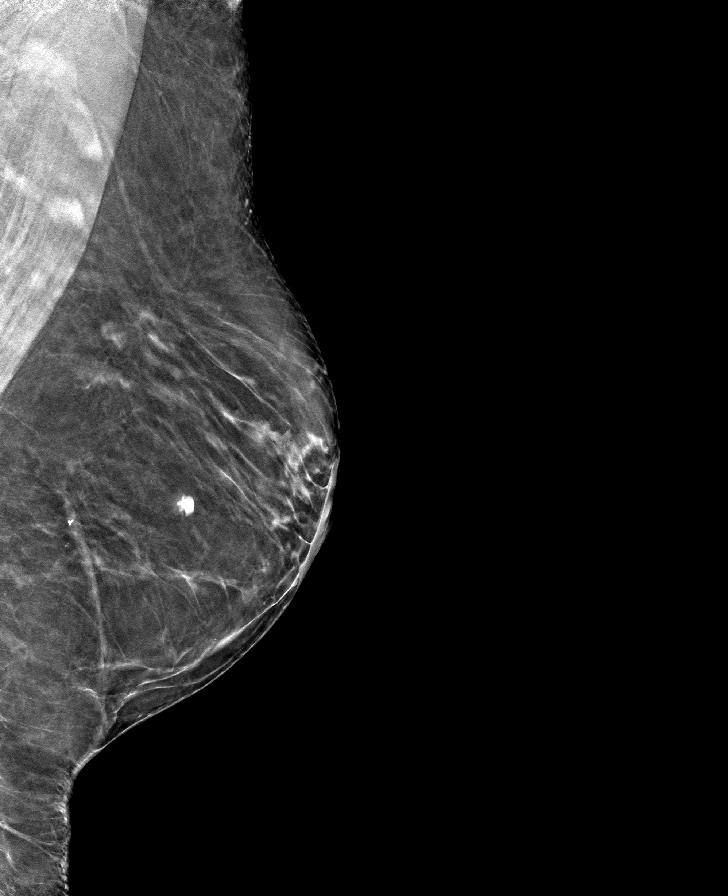

[R MLO tomo · tomo slice 31/60.0]
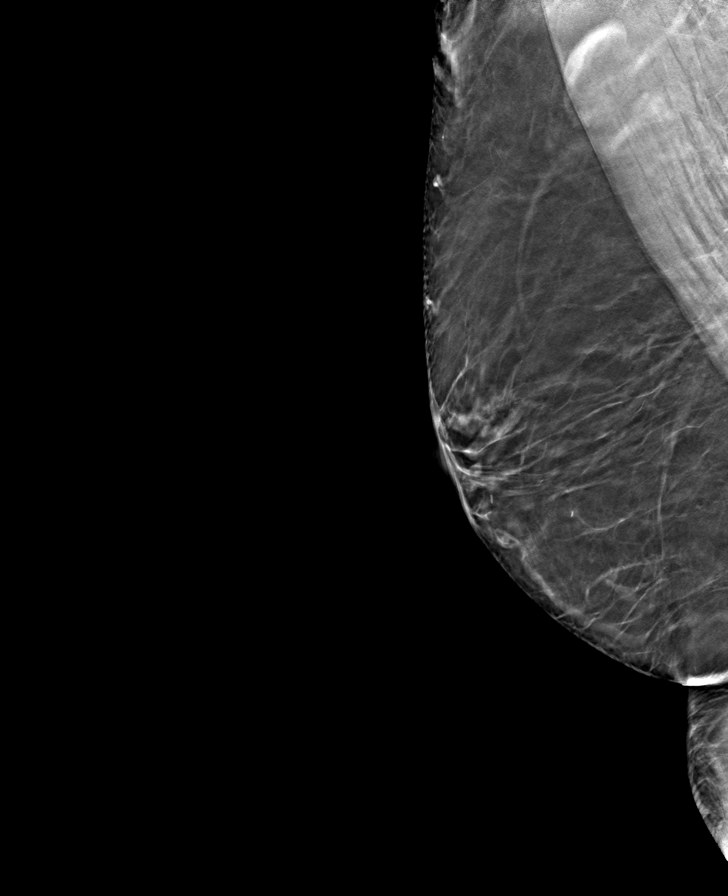

[R CC tomo · tomo slice 27/54.0]
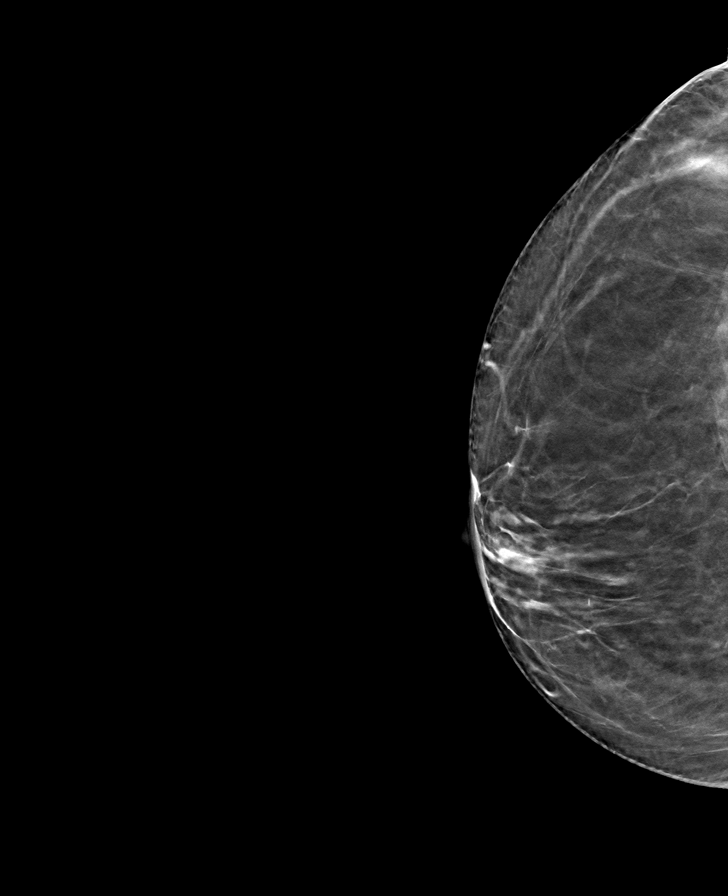

[L CC tomo · tomo slice 28/55.0]
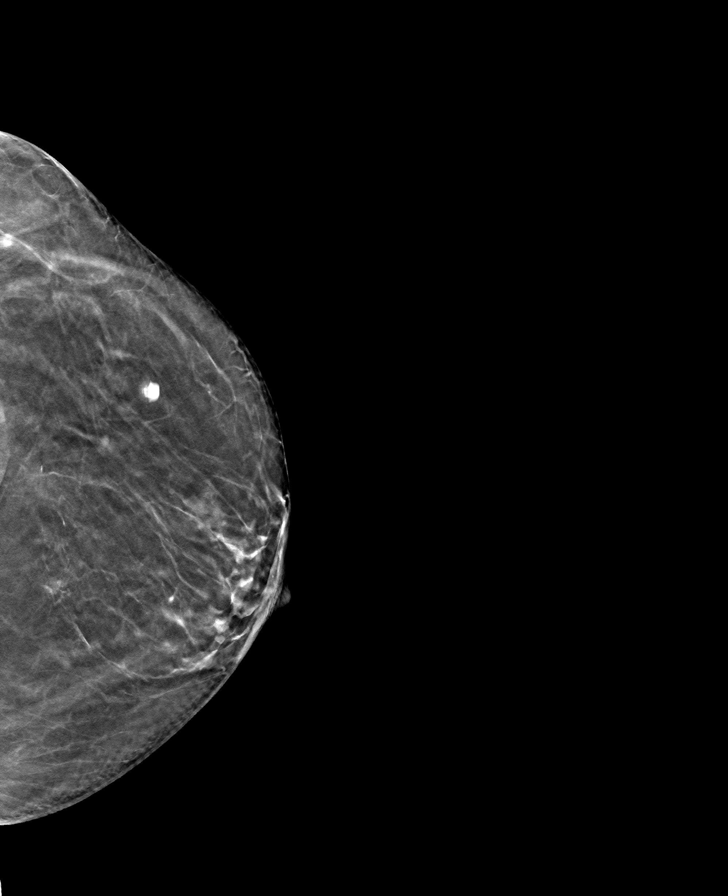

[8 of 24 positions shown; findings below may reference images not displayed]

ACR Breast Density Category b: There are scattered areas of
fibroglandular density.
FINDINGS: There are no findings suspicious for malignancy. Images were
processed with CAD.
IMPRESSION: No mammographic evidence of malignancy. A result letter of this
screening mammogram will be mailed directly to the patient.

RECOMMENDATION:
Screening mammogram in one year. (Code:[id number])

BI-RADS CATEGORY  1: Negative.

## 2021-01-27 ENCOUNTER — Other Ambulatory Visit: Payer: Self-pay | Admitting: Interventional Cardiology

## 2021-01-27 DIAGNOSIS — E782 Mixed hyperlipidemia: Secondary | ICD-10-CM

## 2021-03-15 ENCOUNTER — Ambulatory Visit
Admission: RE | Admit: 2021-03-15 | Discharge: 2021-03-15 | Disposition: A | Discharge status: Home / Self Care | Payer: Medicare PPO | Source: Ambulatory Visit | Attending: Medical | Admitting: Medical

## 2021-03-15 ENCOUNTER — Other Ambulatory Visit: Payer: Self-pay

## 2021-03-15 DIAGNOSIS — Z1231 Encounter for screening mammogram for malignant neoplasm of breast: Secondary | ICD-10-CM

## 2021-03-21 ENCOUNTER — Other Ambulatory Visit
Admission: RE | Admit: 2021-03-21 | Discharge: 2021-03-21 | Disposition: A | Discharge status: Home / Self Care | Payer: Medicare PPO | Attending: Medical | Admitting: Medical

## 2021-03-21 DIAGNOSIS — E7800 Pure hypercholesterolemia, unspecified: Secondary | ICD-10-CM | POA: Insufficient documentation

## 2021-03-21 DIAGNOSIS — E119 Type 2 diabetes mellitus without complications: Secondary | ICD-10-CM | POA: Diagnosis present

## 2021-03-21 LAB — COMPREHENSIVE METABOLIC PANEL
ALT: 38 U/L (ref 0–44)
AST: 33 U/L (ref 15–41)
Albumin: 4.1 g/dL (ref 3.5–5.0)
Alkaline Phosphatase: 128 U/L — ABNORMAL HIGH (ref 38–126)
Anion gap: 9 (ref 5–15)
BUN: 19 mg/dL (ref 8–23)
CO2: 22 mmol/L (ref 22–32)
Calcium: 9.2 mg/dL (ref 8.9–10.3)
Chloride: 105 mmol/L (ref 98–111)
Creatinine, Ser: 0.59 mg/dL (ref 0.44–1.00)
GFR, Estimated: >60 mL/min (ref >60)
Glucose, Bld: 107 mg/dL — ABNORMAL HIGH (ref 70–99)
Potassium: 4.1 mmol/L (ref 3.5–5.1)
Sodium: 136 mmol/L (ref 135–145)
Total Bilirubin: 0.8 mg/dL (ref 0.3–1.2)
Total Protein: 7.6 g/dL (ref 6.5–8.1)

## 2021-03-21 LAB — LIPID PANEL
Cholesterol: 121 mg/dL (ref 0–200)
HDL: 57 mg/dL (ref >40)
LDL Cholesterol: 53 mg/dL (ref 0–99)
Total CHOL/HDL Ratio: 2.1 RATIO
Triglycerides: 56 mg/dL (ref <150)
VLDL: 11 mg/dL (ref 0–40)

## 2021-03-21 LAB — HEMOGLOBIN A1C
Hgb A1c MFr Bld: 6.4 % — ABNORMAL HIGH (ref 4.8–5.6)
Mean Plasma Glucose: 136.98 mg/dL

## 2021-07-28 NOTE — Progress Notes (Signed)
[facility] Note  08/01/2021     CHIEF COMPLAINT Patient presents for Retina Follow Up   HISTORY OF PRESENT ILLNESS: Natasha Franco is a 71 y.o. female who presents to the clinic today for:   HPI     Retina Follow Up   Patient presents with  Other.  In right eye.  Duration of 6 days.  I, the attending physician,  performed the HPI with the patient and updated documentation appropriately.        Comments   Retina eval per Dr. Ellie Lunch for retinoschisis OD-  This was found 6 days ago on exam.  Once and awhile she will have a floater.  H/o FOLs when she is about to get a migraine.  DM II x20 years, BS 130's, A1C 6.7      Last edited by Bernarda Caffey, MD on 08/02/2021 11:12 PM.    Pt is here on the referral of Dr. Ellie Lunch for concern of retinoschisis OD, pt states Dr. Ellie Lunch wanted the schisis looked at before she had cataract sx  Referring physician: Luberta Mutter MD [place], [place] [address]  HISTORICAL INFORMATION:   Selected notes from the MEDICAL RECORD NUMBER Referred by Dr. Ellie Lunch for retinoschisis OD LEE:  Ocular Hx- HH plaque X2 OS (2018), cataracts, progressive retinoschisis OD PMH-    CURRENT MEDICATIONS: No current outpatient medications on file. (Ophthalmic Drugs)   No current facility-administered medications for this visit. (Ophthalmic Drugs)   Current Outpatient Medications (Other)  Medication Sig   ALPRAZolam (XANAX) 0.5 MG tablet Take 0.5 mg by mouth 2 (two) times daily as needed.   azelastine (ASTELIN) 0.1 % nasal spray Place 2 sprays into both nostrils 2 (two) times daily. Use in each nostril as directed   Biotin 1 MG CAPS Take 1 mg by mouth daily.   BREO ELLIPTA 200-25 MCG/INH AEPB 1 puff daily.   Dulaglutide 1.5 MG/0.5ML SOPN Inject 3 mg into the skin every Thursday.    empagliflozin (JARDIANCE) 25 MG TABS tablet Take 25 mg by mouth daily.   escitalopram (LEXAPRO) 20 MG tablet Take 20 mg by mouth at  bedtime.    fluticasone (FLONASE) 50 MCG/ACT nasal spray Place 2 sprays into both nostrils 2 (two) times daily.   ibuprofen (ADVIL) 800 MG tablet Take 800 mg by mouth as needed.    ipratropium (ATROVENT) 0.06 % nasal spray    levocetirizine (XYZAL) 5 MG tablet Take 5 mg by mouth every evening.   metFORMIN (GLUCOPHAGE) 500 MG tablet Take 1,000 mg by mouth daily with supper.    montelukast (SINGULAIR) 10 MG tablet Take 10 mg by mouth at bedtime.   pantoprazole (PROTONIX) 40 MG tablet Take 1 tablet (40 mg total) by mouth 2 (two) times daily before a meal. Take 30 minutes before meals   REPATHA SURECLICK 469 MG/ML SOAJ INJECT 140MG  UNDER THE SKIN EVERY 14 DAYS   topiramate (TOPAMAX) 100 MG tablet Take 100 mg by mouth at bedtime.    ursodiol (ACTIGALL) 300 MG capsule Take 300 mg by mouth 3 (three) times daily.    aspirin 81 MG chewable tablet Chew 1 tablet (81 mg total) by mouth 2 (two) times daily. (Patient taking differently: Chew 81 mg by mouth daily. )   sucralfate (CARAFATE) 1 GM/10ML suspension Take 10 mLs (1 g total) by mouth every 6 (six) hours as needed. Be sure to take at bedtime   No current facility-administered medications for this  visit. (Other)   REVIEW OF SYSTEMS: ROS   Positive for: Gastrointestinal, Neurological, Musculoskeletal, Endocrine, Eyes Negative for: Constitutional, Skin, Genitourinary, HENT, Cardiovascular, Respiratory, Psychiatric, Allergic/Imm, Heme/Lymph Last edited by Leonie Douglas, COA on 08/01/2021  2:09 PM.     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)   Past Surgical History:  Procedure Laterality Date   ABDOMINAL HYSTERECTOMY     BREAST BIOPSY Left 2014   BREAST SURGERY Bilateral 1984   reduction mammoplasty   CARPAL TUNNEL RELEASE     CESAREAN SECTION     JOINT REPLACEMENT     REDUCTION MAMMAPLASTY Bilateral    ROTATOR CUFF REPAIR     TONSILLECTOMY     TOTAL KNEE ARTHROPLASTY Left 03/25/2019   Procedure: Left Knee Arthroplasty;  Surgeon: Melrose Nakayama, MD;  Location: [facility];  Service: Orthopedics;  Laterality: Left;   VAGINAL HYSTERECTOMY     FAMILY HISTORY Family History  Problem Relation Age of Onset   Arthritis Mother    COPD Father    Diabetes Mellitus II Father    Colon cancer Neg Hx    Liver cancer Neg Hx    Stomach cancer Neg Hx    Esophageal cancer Neg Hx    Rectal cancer Neg Hx    SOCIAL HISTORY Social History   Tobacco Use   Smoking status: Former    Packs/day: 1.00    Years: 25.00    Pack years: 25.00    Types: Cigarettes    Quit date: 09/01/2006    Years since quitting: 14.9   Smokeless tobacco: Never  Vaping Use   Vaping Use: Never used  Substance Use Topics   Alcohol use: Yes    Comment: occ   Drug use: No       OPHTHALMIC EXAM:  Base Eye Exam     Visual Acuity (Snellen - Linear)       Right Left   Dist cc 20/25 +1 20/25   Dist ph cc 20/20 -2 NI         Tonometry (Tonopen, 2:24 PM)       Right Left   Pressure 16 13         Pupils       Dark Light Shape React APD   Right 4 3 Round Brisk None   Left 4 3 Round Brisk None         Visual Fields (Counting fingers)       Left Right    Full Full         Extraocular Movement       Right Left    Full Full         Neuro/Psych     Oriented x3: Yes   Mood/Affect: Normal         Dilation     Both eyes: 1.0% Mydriacyl, 2.5% Phenylephrine @ 2:25 PM  Slit Lamp and Fundus Exam     Slit Lamp  Exam       Right Left   Lids/Lashes Dermatochalasis - upper lid, mild MGD, Telangiectasia Dermatochalasis - upper lid, mild MGD, Telangiectasia   Conjunctiva/Sclera White and quiet White and quiet   Cornea trace PEE, trace tear film debris 1-2+PEE, trace tear film debris   Anterior Chamber Deep and quiet Deep and quiet   Iris Round and dilated Round and dilated   Lens 2+ Nuclear sclerosis, 2+ Cortical cataract 2-3+ Nuclear sclerosis with brunescence, 2+ Cortical cataract   Anterior Vitreous Vitreous syneresis, +fine pigment, Posterior vitreous detachment, pigmented debris settled inferiory Vitreous syneresis, no pigment, Posterior vitreous detachment, vitreous condensations         Fundus Exam       Right Left   Disc Pink and Sharp, mild tilt, PPA Pink and Sharp, tilted, inferior PPA   C/D Ratio 0.5 0.5   Macula Flat, Blunted foveal reflex, mild RPE mottling, No heme or edema Flat, Blunted foveal reflex, mild RPE mottling, No heme or edema   Vessels mild attenuation, mild tortuousity, mild AV crossing changes mild attenuation, mild tortuousity   Periphery Attached, inferior schisis at 0400-0730 with pigment clumping and pigment atrophy within schisis cavity, outer retinal hole at 0700 Attached, very shallow schisis IT periphery           Refraction     Wearing Rx       Sphere Cylinder Axis Add   Right +1.50 +0.50 133 +2.50   Left +2.25 Sphere  +2.50         Manifest Refraction       Sphere Cylinder Dist VA   Right -1.75 Sphere 20/25+2   Left +2.00 Sphere 20/25+2            IMAGING AND PROCEDURES  Imaging and Procedures for 08/01/2021  OCT, Retina - OU - Both Eyes       Right Eye Quality was good. Central Foveal Thickness: 291. Progression has no prior data. Findings include normal foveal contour, no IRF, no SRF (IT schisis with outer retinal holes caught on widefield).   Left Eye Quality was good. Central Foveal Thickness: 274. Progression has no prior  data. Findings include normal foveal contour, no IRF, no SRF, vitreomacular adhesion (Shallow schisis IT periphery caught on widefield).   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 CME - cystoid macular edema. PED - pigment epithelial detachment. IRF - intraretinal fluid. SRF - subretinal fluid. EZ - ellipsoid zone. ERM - epiretinal membrane. ORA - outer retinal atrophy. ORT - outer retinal tubulation. SRHM - subretinal hyper-reflective material. 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 Diabetes mellitus type 2 without retinopathy ([place])  E11.9     3. Combined forms of age-related cataract of both eyes  H25.813      1. Retinoschisis OU - OD: inferior schisis 0400-0730 with pigment clumping and pigment atrophy within schisis cavity, outer retinal hole at 0700 - OS: very shallow schisis IT periphery - pt reports intermittent photopsias and floaters OU -- may not be related to schisis -  discussed findings, prognosis and potential treatment options - recommend laser retinopexy OD due to outer retinal hole within schisis cavity - pt to come back next Monday, January 30 for laser retinopexy OD  2. Diabetes mellitus, type 2 without retinopathy - The incidence, risk factors for progression, natural history and treatment options for diabetic retinopathy  were discussed with patient.   - The need for close monitoring of blood glucose, blood pressure, and serum lipids, avoiding cigarette or any type of tobacco, and the need for long term follow up was also discussed with patient. - f/u in 1 year, sooner prn  3. Mixed Cataract OU - The symptoms of  cataract, surgical options, and treatments and risks were discussed with patient. - under the expert management of Dr. Ellie Lunch - clear from a retina standpoint to proceed with cataract surgery OS when pt and surgeon are ready - will clear OD once retinoschisis and pending laser are stable   Ophthalmic Meds Ordered this visit:  No orders of the defined types were placed in this encounter.     Return in about 1 week (around 08/08/2021) for f/u retinoschisis OU, DFE, OCT, Laser retinopexy OD.  There are no Patient Instructions on file for this visit.   Explained the diagnoses, plan, and follow up with the patient and they expressed understanding.  Patient expressed understanding of the importance of proper follow up care.   This document serves as a record of services personally performed by Gardiner Sleeper, MD, PhD. It was created on their behalf by Roselee Nova, COMT. The creation of this record is the provider's dictation and/or activities during the visit.  Electronically signed by: Roselee Nova, COMT 08/02/21 11:14 PM  Gardiner Sleeper, M.D., Ph.D. Diseases & Surgery of the Retina and Vitreous [facility]  I have reviewed the above documentation for accuracy and completeness, and I agree with the above. Gardiner Sleeper, M.D., Ph.D. 08/02/21 11:22 PM  Abbreviations: M myopia (nearsighted); A astigmatism; H hyperopia (farsighted); P presbyopia; Mrx spectacle prescription;  CTL contact lenses; OD right eye; OS left eye; OU both eyes  XT exotropia; ET esotropia; PEK punctate epithelial keratitis; PEE punctate epithelial erosions; DES dry eye syndrome; MGD meibomian gland dysfunction; ATs artificial tears; PFAT's preservative free artificial tears; [place] nuclear sclerotic cataract; PSC posterior subcapsular cataract; ERM epi-retinal membrane; PVD posterior vitreous detachment; RD retinal detachment; DM diabetes mellitus; DR diabetic retinopathy; NPDR non-proliferative  diabetic retinopathy; PDR proliferative diabetic retinopathy; CSME clinically significant macular edema; DME diabetic macular edema; dbh dot blot hemorrhages; CWS cotton wool spot; POAG primary open angle glaucoma; C/D cup-to-disc ratio; HVF humphrey visual field; GVF goldmann visual field; OCT optical coherence tomography; IOP intraocular pressure; BRVO Branch retinal vein occlusion; CRVO central retinal vein occlusion; CRAO central retinal artery occlusion; BRAO branch retinal artery occlusion; RT retinal tear; SB scleral buckle; PPV pars plana vitrectomy; VH Vitreous hemorrhage; PRP panretinal laser photocoagulation; IVK intravitreal kenalog; VMT vitreomacular traction; MH Macular hole;  NVD neovascularization of the disc; NVE neovascularization elsewhere; AREDS age related eye disease study; ARMD age related macular degeneration; POAG primary open angle glaucoma; EBMD epithelial/anterior basement membrane dystrophy; ACIOL anterior chamber intraocular lens; IOL intraocular lens; PCIOL posterior chamber intraocular lens; Phaco/IOL phacoemulsification with intraocular lens placement; [place] photorefractive keratectomy; LASIK laser assisted in situ keratomileusis; HTN hypertension; DM diabetes mellitus; COPD chronic obstructive pulmonary disease

## 2021-08-01 ENCOUNTER — Ambulatory Visit (INDEPENDENT_AMBULATORY_CARE_PROVIDER_SITE_OTHER): Payer: Medicare PPO | Admitting: Ophthalmology

## 2021-08-01 ENCOUNTER — Encounter (INDEPENDENT_AMBULATORY_CARE_PROVIDER_SITE_OTHER): Payer: Self-pay | Admitting: Ophthalmology

## 2021-08-01 ENCOUNTER — Other Ambulatory Visit: Payer: Self-pay

## 2021-08-01 DIAGNOSIS — H33103 Unspecified retinoschisis, bilateral: Secondary | ICD-10-CM

## 2021-08-01 DIAGNOSIS — E119 Type 2 diabetes mellitus without complications: Secondary | ICD-10-CM

## 2021-08-01 DIAGNOSIS — H25813 Combined forms of age-related cataract, bilateral: Secondary | ICD-10-CM

## 2021-08-02 ENCOUNTER — Encounter (INDEPENDENT_AMBULATORY_CARE_PROVIDER_SITE_OTHER): Payer: Self-pay | Admitting: Ophthalmology

## 2021-08-05 NOTE — Progress Notes (Addendum)
[facility] Note  08/08/2021     CHIEF COMPLAINT Patient presents for Retina Follow Up   HISTORY OF PRESENT ILLNESS: Natasha Franco is a 71 y.o. female who presents to the clinic today for:   HPI     Retina Follow Up   Patient presents with  Other.  In both eyes.  This started 1 week ago.  I, the attending physician,  performed the HPI with the patient and updated documentation appropriately.        Comments   Patient here for 1 weeks retina follow up fpr retinoschisis OU, laser retinopexy OD. Patient states vision no different. No eye pain.      Last edited by Bernarda Caffey, MD on 08/08/2021 10:35 PM.    Pt here for laser retinopexy OD   Referring physician: Luberta Mutter MD [place], [place] [address]  HISTORICAL INFORMATION:   Selected notes from the MEDICAL RECORD NUMBER Referred by Dr. Ellie Lunch for retinoschisis OD LEE:  Ocular Hx- HH plaque X2 OS (2018), cataracts, progressive retinoschisis OD PMH-    CURRENT MEDICATIONS: Current Outpatient Medications (Ophthalmic Drugs)  Medication Sig   prednisoLONE acetate (PRED FORTE) 1 % ophthalmic suspension Place 1 drop into the right eye 4 (four) times daily for 7 days.   No current facility-administered medications for this visit. (Ophthalmic Drugs)   Current Outpatient Medications (Other)  Medication Sig   ALPRAZolam (XANAX) 0.5 MG tablet Take 0.5 mg by mouth 2 (two) times daily as needed.   azelastine (ASTELIN) 0.1 % nasal spray Place 2 sprays into both nostrils 2 (two) times daily. Use in each nostril as directed   Biotin 1 MG CAPS Take 1 mg by mouth daily.   BREO ELLIPTA 200-25 MCG/INH AEPB 1 puff daily.   Dulaglutide 1.5 MG/0.5ML SOPN Inject 3 mg into the skin every Thursday.    empagliflozin (JARDIANCE) 25 MG TABS tablet Take 25 mg by mouth daily.   escitalopram (LEXAPRO) 20 MG tablet Take 20 mg by mouth at bedtime.    fluticasone (FLONASE) 50 MCG/ACT nasal  spray Place 2 sprays into both nostrils 2 (two) times daily.   ibuprofen (ADVIL) 800 MG tablet Take 800 mg by mouth as needed.    ipratropium (ATROVENT) 0.06 % nasal spray    levocetirizine (XYZAL) 5 MG tablet Take 5 mg by mouth every evening.   metFORMIN (GLUCOPHAGE) 500 MG tablet Take 1,000 mg by mouth daily with supper.    montelukast (SINGULAIR) 10 MG tablet Take 10 mg by mouth at bedtime.   pantoprazole (PROTONIX) 40 MG tablet Take 1 tablet (40 mg total) by mouth 2 (two) times daily before a meal. Take 30 minutes before meals   REPATHA SURECLICK 983 MG/ML SOAJ INJECT 140MG  UNDER THE SKIN EVERY 14 DAYS   topiramate (TOPAMAX) 100 MG tablet Take 100 mg by mouth at bedtime.    ursodiol (ACTIGALL) 300 MG capsule Take 300 mg by mouth 3 (three) times daily.    aspirin 81 MG chewable tablet Chew 1 tablet (81 mg total) by mouth 2 (two) times daily. (Patient taking differently: Chew 81 mg by mouth daily. )   sucralfate (CARAFATE) 1 GM/10ML suspension Take 10 mLs (1 g total) by mouth every 6 (six) hours as needed. Be sure to take at bedtime   No current facility-administered medications for this visit. (Other)   REVIEW OF SYSTEMS: ROS   Positive for: Gastrointestinal, Neurological, Musculoskeletal, Endocrine, Eyes Negative for: Constitutional,  Skin, Genitourinary, HENT, Cardiovascular, Respiratory, Psychiatric, Allergic/Imm, Heme/Lymph Last edited by Theodore Demark, COA on 08/08/2021 10:08 AM.     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)   Past Surgical History:  Procedure Laterality Date   ABDOMINAL HYSTERECTOMY     BREAST BIOPSY Left 2014   BREAST SURGERY Bilateral 1984   reduction mammoplasty   CARPAL TUNNEL RELEASE     CESAREAN SECTION     JOINT REPLACEMENT     REDUCTION MAMMAPLASTY Bilateral    ROTATOR CUFF REPAIR     TONSILLECTOMY     TOTAL KNEE ARTHROPLASTY Left 03/25/2019   Procedure: Left Knee Arthroplasty;  Surgeon: Melrose Nakayama, MD;  Location: [facility];  Service: Orthopedics;  Laterality: Left;   VAGINAL HYSTERECTOMY     FAMILY HISTORY Family History  Problem Relation Age of Onset   Arthritis Mother    COPD Father    Diabetes Mellitus II Father    Colon cancer Neg Hx    Liver cancer Neg Hx    Stomach cancer Neg Hx    Esophageal cancer Neg Hx    Rectal cancer Neg Hx    SOCIAL HISTORY Social History   Tobacco Use   Smoking status: Former    Packs/day: 1.00    Years: 25.00    Pack years: 25.00    Types: Cigarettes    Quit date: 09/01/2006    Years since quitting: 14.9   Smokeless tobacco: Never  Vaping Use   Vaping Use: Never used  Substance Use Topics   Alcohol use: Yes    Comment: occ   Drug use: No       OPHTHALMIC EXAM:  Base Eye Exam     Visual Acuity (Snellen - Linear)       Right Left   Dist cc 20/25 20/25 -1   Dist ph cc 20/20 20/20    Correction: Glasses         Tonometry (Tonopen, 10:03 AM)       Right Left   Pressure 12 12         Pupils       Dark Light Shape React APD   Right 4 3 Round Brisk None   Left 4 3 Round Brisk None         Visual Fields (Counting fingers)       Left Right    Full Full         Extraocular Movement       Right Left    Full, Ortho Full, Ortho         Neuro/Psych     Oriented x3: Yes   Mood/Affect: Normal         Dilation     Both eyes: 1.0% Mydriacyl, 2.5% Phenylephrine @ 10:03 AM           Slit Lamp and Fundus Exam  Slit Lamp Exam        Right Left   Lids/Lashes Dermatochalasis - upper lid, mild MGD, Telangiectasia Dermatochalasis - upper lid, mild MGD, Telangiectasia   Conjunctiva/Sclera White and quiet White and quiet   Cornea trace PEE, trace tear film debris 1-2+PEE, trace tear film debris   Anterior Chamber Deep and quiet Deep and quiet   Iris Round and dilated Round and dilated   Lens 2+ Nuclear sclerosis, 2+ Cortical cataract 2-3+ Nuclear sclerosis with brunescence, 2+ Cortical cataract   Anterior Vitreous Vitreous syneresis, +fine pigment, Posterior vitreous detachment, pigmented debris settled inferiory Vitreous syneresis, no pigment, Posterior vitreous detachment, vitreous condensations         Fundus Exam       Right Left   Disc Pink and Sharp, mild tilt, PPA Pink and Sharp, tilted, inferior PPA   C/D Ratio 0.5 0.5   Macula Flat, Blunted foveal reflex, mild RPE mottling, No heme or edema Flat, Blunted foveal reflex, mild RPE mottling, No heme or edema   Vessels mild attenuation, mild tortuousity, mild AV crossing changes mild attenuation, mild tortuousity   Periphery Attached, inferior schisis at 0400-0730 with pigment clumping and pigment atrophy within schisis cavity, outer retinal hole at 0700 Attached, very shallow schisis IT periphery           Refraction     Wearing Rx       Sphere Cylinder Axis Add   Right +1.50 +0.50 133 +2.50   Left +2.25 Sphere  +2.50           IMAGING AND PROCEDURES  Imaging and Procedures for 08/08/2021  OCT, Retina - OU - Both Eyes       Right Eye Quality was good. Central Foveal Thickness: 296. Progression has been stable. Findings include normal foveal contour, no IRF, no SRF (IT schisis with outer retinal holes caught on widefield).   Left Eye Quality was good. Central Foveal Thickness: 277. Progression has been stable. Findings include normal foveal contour, no IRF, no SRF, vitreomacular adhesion (Shallow schisis IT periphery caught on widefield).    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 CME - cystoid macular edema. PED - pigment epithelial detachment. IRF - intraretinal fluid. SRF - subretinal fluid. EZ - ellipsoid zone. ERM - epiretinal membrane. ORA - outer retinal atrophy. ORT - outer retinal tubulation. SRHM - subretinal hyper-reflective material. IRHM - intraretinal hyper-reflective material      Repair Retinal Breaks, Laser - OD - Right Eye       LASER PROCEDURE NOTE  Procedure:  Barrier laser retinopexy using slit lamp laser, RIGHT eye   Diagnosis:   Retinoschisis w/ outer retinal hole, RIGHT eye                     Inferior retinoschisis from 0400-0730 oclock w/ outer retinal hole at 0700   Surgeon: Bernarda Caffey, MD, PhD  Anesthesia: Topical  Informed consent obtained, operative eye marked, and time out performed prior to initiation of laser.   Laser settings:  Lumenis [id number] laser, slit lamp Lens: Mainster PRP 165 Power: 360 mW Spot size: 200 microns Duration: 30 msec  # spots: [id number]  Placement of laser: Using a [place]ster PRP 165 contact lens at the slit lamp, laser was placed in  three+ confluent rows just posterior to inferior retinoschisis w/ outer retinal hole from 0400-0730--lateral edges sealed to ora.  Complications: None.  Patient tolerated the procedure well and received written and verbal post-procedure care information/education.            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 Retinal hole of right eye  H33.321 Repair Retinal Breaks, Laser - OD - Right Eye    3. Diabetes mellitus type 2 without retinopathy ([facility])  E11.9     4. Combined forms of  age-related cataract of both eyes  H25.813       1,2. Retinoschisis OU - OD: inferior schisis 0400-0730 with pigment clumping and pigment atrophy within schisis cavity, outer retinal hole at 0700 - OS: very shallow schisis IT periphery - pt reports intermittent photopsias and floaters OU -- may not be related to schisis - recommend laser retinopexy OD due to outer retinal hole within schisis cavity today, 01.30.23 - pt wishes to proceed with laser - RBA of procedure discussed, questions answered - informed consent obtained and signed - see procedure note - start PF QID OD x7 days - f/u 2 weeks, DFE, OCT  3. Diabetes mellitus, type 2 without retinopathy - The incidence, risk factors for progression, natural history and treatment options for diabetic retinopathy  were discussed with patient.   - The need for close monitoring of blood glucose, blood pressure, and serum lipids, avoiding cigarette or any type of tobacco, and the need for long term follow up was also discussed with patient. - f/u in 1 year, sooner prn  4. Mixed Cataract OU - The symptoms of cataract, surgical options, and treatments and risks were discussed with patient. - under the expert management of Dr. Ellie Lunch - clear from a retina standpoint to proceed with cataract surgery OS when pt and surgeon are ready - will clear OD once retinoschisis and pending laser are stable   Ophthalmic Meds Ordered this visit:  Meds ordered this encounter  Medications   prednisoLONE acetate (PRED FORTE) 1 % ophthalmic suspension    Sig: Place 1 drop into the right eye 4 (four) times daily for 7 days.    Dispense:  10 mL    Refill:  0     Return in about 2 weeks (around 08/22/2021) for f/u retinoschisis OU, DFE, OCT.  There are no Patient Instructions on file for this visit.   Explained the diagnoses, plan, and follow up with the patient and they expressed understanding.  Patient expressed understanding of the importance of proper  follow up care.   This document serves as a record of services personally performed by Gardiner Sleeper, MD, PhD. It was created on their behalf by Roselee Nova, COMT. The creation of this record is the provider's dictation and/or activities during the visit.  Electronically signed by: Roselee Nova, COMT 08/08/21 10:54 PM  This document serves as a record of services personally performed by Gardiner Sleeper, MD, PhD. It was created on their behalf by San Jetty. Owens Shark, OA an ophthalmic technician. The creation of this record is the provider's dictation and/or activities during the visit.    Electronically signed by: San Jetty. Owens Shark, [place] 01.30.2023 10:54 PM   Gardiner Sleeper, M.D., Ph.D. Diseases & Surgery of the Retina and [place]  I have reviewed the above documentation for accuracy and completeness, and I agree with the above. Gardiner Sleeper, M.D., Ph.D. 08/08/21 10:54 PM   Abbreviations: M myopia (nearsighted); A astigmatism; H hyperopia (farsighted); P presbyopia; Mrx spectacle prescription;  CTL contact lenses; OD right eye; OS left eye; OU both eyes  XT exotropia; ET esotropia; PEK punctate epithelial keratitis; PEE punctate epithelial erosions; DES dry eye syndrome; MGD meibomian gland dysfunction; ATs artificial tears; PFAT's preservative free artificial tears; [place] nuclear sclerotic cataract; PSC posterior subcapsular cataract; ERM epi-retinal membrane; PVD posterior vitreous detachment; RD retinal detachment; DM diabetes mellitus; DR diabetic retinopathy; NPDR non-proliferative diabetic retinopathy; PDR proliferative diabetic retinopathy; CSME clinically significant macular edema; DME diabetic macular edema; dbh dot blot hemorrhages; CWS cotton wool spot; POAG primary open angle glaucoma; C/D cup-to-disc ratio; HVF humphrey visual field; GVF goldmann visual field; OCT optical coherence tomography; IOP intraocular pressure; BRVO Branch retinal vein  occlusion; CRVO central retinal vein occlusion; CRAO central retinal artery occlusion; BRAO branch retinal artery occlusion; RT retinal tear; SB scleral buckle; PPV pars plana vitrectomy; VH Vitreous hemorrhage; PRP panretinal laser photocoagulation; IVK intravitreal kenalog; VMT vitreomacular traction; MH Macular hole;  NVD neovascularization of the disc; NVE neovascularization elsewhere; AREDS age related eye disease study; ARMD age related macular degeneration; POAG primary open angle glaucoma; EBMD epithelial/anterior basement membrane dystrophy; ACIOL anterior chamber intraocular lens; IOL intraocular lens; PCIOL posterior chamber intraocular lens; Phaco/IOL phacoemulsification with intraocular lens placement; [place] photorefractive keratectomy; LASIK laser assisted in situ keratomileusis; HTN hypertension; DM diabetes mellitus; COPD chronic obstructive pulmonary disease

## 2021-08-05 NOTE — Progress Notes (Incomplete)
[facility] Note  08/08/2021     CHIEF COMPLAINT Patient presents for No chief complaint on file.   HISTORY OF PRESENT ILLNESS: Natasha Franco is a 71 y.o. female who presents to the clinic today for:    Pt is here on the referral of Dr. Ellie Lunch for concern of retinoschisis OD, pt states Dr. Ellie Lunch wanted the schisis looked at before she had cataract sx  Referring physician: Luberta Mutter MD [place], [place] [id number]  HISTORICAL INFORMATION:   Selected notes from the MEDICAL RECORD NUMBER Referred by Dr. Ellie Lunch for retinoschisis OD LEE:  Ocular Hx- HH plaque X2 OS (2018), cataracts, progressive retinoschisis OD PMH-    CURRENT MEDICATIONS: No current outpatient medications on file. (Ophthalmic Drugs)   No current facility-administered medications for this visit. (Ophthalmic Drugs)   Current Outpatient Medications (Other)  Medication Sig   ALPRAZolam (XANAX) 0.5 MG tablet Take 0.5 mg by mouth 2 (two) times daily as needed.   aspirin 81 MG chewable tablet Chew 1 tablet (81 mg total) by mouth 2 (two) times daily. (Patient taking differently: Chew 81 mg by mouth daily. )   azelastine (ASTELIN) 0.1 % nasal spray Place 2 sprays into both nostrils 2 (two) times daily. Use in each nostril as directed   Biotin 1 MG CAPS Take 1 mg by mouth daily.   BREO ELLIPTA 200-25 MCG/INH AEPB 1 puff daily.   Dulaglutide 1.5 MG/0.5ML SOPN Inject 3 mg into the skin every Thursday.    empagliflozin (JARDIANCE) 25 MG TABS tablet Take 25 mg by mouth daily.   escitalopram (LEXAPRO) 20 MG tablet Take 20 mg by mouth at bedtime.    fluticasone (FLONASE) 50 MCG/ACT nasal spray Place 2 sprays into both nostrils 2 (two) times daily.   ibuprofen (ADVIL) 800 MG tablet Take 800 mg by mouth as needed.    ipratropium (ATROVENT) 0.06 % nasal spray    levocetirizine (XYZAL) 5 MG tablet Take 5 mg by mouth every evening.   metFORMIN (GLUCOPHAGE) 500 MG tablet Take  1,000 mg by mouth daily with supper.    montelukast (SINGULAIR) 10 MG tablet Take 10 mg by mouth at bedtime.   pantoprazole (PROTONIX) 40 MG tablet Take 1 tablet (40 mg total) by mouth 2 (two) times daily before a meal. Take 30 minutes before meals   REPATHA SURECLICK 213 MG/ML SOAJ INJECT 140MG  UNDER THE SKIN EVERY 14 DAYS   sucralfate (CARAFATE) 1 GM/10ML suspension Take 10 mLs (1 g total) by mouth every 6 (six) hours as needed. Be sure to take at bedtime   topiramate (TOPAMAX) 100 MG tablet Take 100 mg by mouth at bedtime.    ursodiol (ACTIGALL) 300 MG capsule Take 300 mg by mouth 3 (three) times daily.    No current facility-administered medications for this visit. (Other)   REVIEW OF SYSTEMS:   ALLERGIES Allergies  Allergen Reactions   Invokana [Canagliflozin]     VAGINAL YEAST INFECTIONS   Statins    Versed [Midazolam]     "Makes my mouth run"   Zetia [Ezetimibe] Cough   Bupropion Hcl Rash   PAST MEDICAL HISTORY Past Medical History:  Diagnosis Date   Arthritis    Barrett's esophagus    Complication of anesthesia    Diabetes mellitus without complication ([facility])    GERD (gastroesophageal reflux disease)    Hyperlipidemia    Per pt   Migraines    Primary biliary cirrhosis ([facility])  suspected (AMA (+) but biopsy negative 2014)   Past Surgical History:  Procedure Laterality Date   ABDOMINAL HYSTERECTOMY     BREAST BIOPSY Left 2014   BREAST SURGERY Bilateral 1984   reduction mammoplasty   CARPAL TUNNEL RELEASE     CESAREAN SECTION     JOINT REPLACEMENT     REDUCTION MAMMAPLASTY Bilateral    ROTATOR CUFF REPAIR     TONSILLECTOMY     TOTAL KNEE ARTHROPLASTY Left 03/25/2019   Procedure: Left Knee Arthroplasty;  Surgeon: Melrose Nakayama, MD;  Location: [facility];  Service: Orthopedics;  Laterality: Left;   VAGINAL HYSTERECTOMY     FAMILY HISTORY Family History  Problem Relation Age of Onset   Arthritis Mother    COPD Father    Diabetes Mellitus II Father    Colon  cancer Neg Hx    Liver cancer Neg Hx    Stomach cancer Neg Hx    Esophageal cancer Neg Hx    Rectal cancer Neg Hx    SOCIAL HISTORY Social History   Tobacco Use   Smoking status: Former    Packs/day: 1.00    Years: 25.00    Pack years: 25.00    Types: Cigarettes    Quit date: 09/01/2006    Years since quitting: 14.9   Smokeless tobacco: Never  Vaping Use   Vaping Use: Never used  Substance Use Topics   Alcohol use: Yes    Comment: occ   Drug use: No       OPHTHALMIC EXAM:  Not recorded     IMAGING AND PROCEDURES  Imaging and Procedures for 08/08/2021          ASSESSMENT/PLAN:  No diagnosis found.  1. Retinoschisis OU - OD: inferior schisis 0400-0730 with pigment clumping and pigment atrophy within schisis cavity, outer retinal hole at 0700 - OS: very shallow schisis IT periphery - pt reports intermittent photopsias and floaters OU -- may not be related to schisis - discussed findings, prognosis and potential treatment options - recommend laser retinopexy OD due to outer retinal hole within schisis cavity - pt to come back next Monday, January 30 for laser retinopexy OD  2. Diabetes mellitus, type 2 without retinopathy - The incidence, risk factors for progression, natural history and treatment options for diabetic retinopathy  were discussed with patient.   - The need for close monitoring of blood glucose, blood pressure, and serum lipids, avoiding cigarette or any type of tobacco, and the need for long term follow up was also discussed with patient. - f/u in 1 year, sooner prn  3. Mixed Cataract OU - The symptoms of cataract, surgical options, and treatments and risks were discussed with patient. - under the expert management of Dr. Ellie Lunch - clear from a retina standpoint to proceed with cataract surgery OS when pt and surgeon are ready - will clear OD once retinoschisis and pending laser are stable   Ophthalmic Meds Ordered this visit:  No orders of  the defined types were placed in this encounter.     No follow-ups on file.  There are no Patient Instructions on file for this visit.   Explained the diagnoses, plan, and follow up with the patient and they expressed understanding.  Patient expressed understanding of the importance of proper follow up care.   This document serves as a record of services personally performed by Gardiner Sleeper, MD, PhD. It was created on their behalf by Roselee Nova, COMT. The creation of this  record is the provider's dictation and/or activities during the visit.  Electronically signed by: Roselee Nova, COMT 08/05/21 12:12 PM  Gardiner Sleeper, M.D., Ph.D. Diseases & Surgery of the Retina and [place]   I have reviewed the above documentation for accuracy and completeness, and I agree with the above. Gardiner Sleeper, M.D., Ph.D. 08/02/21 12:12 PMions: M myopia (nearsighted); A astigmatism; H hyperopia (farsighted); P presbyopia; Mrx spectacle prescription;  CTL contact lenses; OD right eye; OS left eye; OU both eyes  XT exotropia; ET esotropia; PEK punctate epithelial keratitis; PEE punctate epithelial erosions; DES dry eye syndrome; MGD meibomian gland dysfunction; ATs artificial tears; PFAT's preservative free artificial tears; [place] nuclear sclerotic cataract; PSC posterior subcapsular cataract; ERM epi-retinal membrane; PVD posterior vitreous detachment; RD retinal detachment; DM diabetes mellitus; DR diabetic retinopathy; NPDR non-proliferative diabetic retinopathy; PDR proliferative diabetic retinopathy; CSME clinically significant macular edema; DME diabetic macular edema; dbh dot blot hemorrhages; CWS cotton wool spot; POAG primary open angle glaucoma; C/D cup-to-disc ratio; HVF humphrey visual field; GVF goldmann visual field; OCT optical coherence tomography; IOP intraocular pressure; BRVO Branch retinal vein occlusion; CRVO central retinal vein occlusion; CRAO central  retinal artery occlusion; BRAO branch retinal artery occlusion; RT retinal tear; SB scleral buckle; PPV pars plana vitrectomy; VH Vitreous hemorrhage; PRP panretinal laser photocoagulation; IVK intravitreal kenalog; VMT vitreomacular traction; MH Macular hole;  NVD neovascularization of the disc; NVE neovascularization elsewhere; AREDS age related eye disease study; ARMD age related macular degeneration; POAG primary open angle glaucoma; EBMD epithelial/anterior basement membrane dystrophy; ACIOL anterior chamber intraocular lens; IOL intraocular lens; PCIOL posterior chamber intraocular lens; Phaco/IOL phacoemulsification with intraocular lens placement; [place] photorefractive keratectomy; LASIK laser assisted in situ keratomileusis; HTN hypertension; DM diabetes mellitus; COPD chronic obstructive pulmonary disease

## 2021-08-08 ENCOUNTER — Ambulatory Visit (INDEPENDENT_AMBULATORY_CARE_PROVIDER_SITE_OTHER): Payer: Medicare PPO | Admitting: Ophthalmology

## 2021-08-08 ENCOUNTER — Encounter (INDEPENDENT_AMBULATORY_CARE_PROVIDER_SITE_OTHER): Payer: Self-pay | Admitting: Ophthalmology

## 2021-08-08 ENCOUNTER — Other Ambulatory Visit: Payer: Self-pay

## 2021-08-08 DIAGNOSIS — H33103 Unspecified retinoschisis, bilateral: Secondary | ICD-10-CM | POA: Diagnosis not present

## 2021-08-08 DIAGNOSIS — H33321 Round hole, right eye: Secondary | ICD-10-CM

## 2021-08-08 DIAGNOSIS — H25813 Combined forms of age-related cataract, bilateral: Secondary | ICD-10-CM

## 2021-08-08 DIAGNOSIS — E119 Type 2 diabetes mellitus without complications: Secondary | ICD-10-CM

## 2021-08-08 MED ORDER — PREDNISOLONE ACETATE 1 % OP SUSP: 1.0000 [drp] | Freq: Four times a day (QID) | OPHTHALMIC | 0 refills | Status: AC

## 2021-08-18 NOTE — Progress Notes (Signed)
[facility] Note  08/22/2021     CHIEF COMPLAINT Patient presents for Retina Follow Up   HISTORY OF PRESENT ILLNESS: Natasha Franco is a 71 y.o. female who presents to the clinic today for:   HPI     Retina Follow Up   Diagnosis: retinoschisis.  In both eyes.  Severity is moderate.  Duration of 2 weeks.  Since onset it is stable.  I, the attending physician,  performed the HPI with the patient and updated documentation appropriately.        Comments   Patient states vision the same OU.      Last edited by Bernarda Caffey, MD on 08/22/2021 11:42 PM.     Pt states she had no problems with the laser procedure at last visit other than a headache from being dilated, she has left eye cat sx scheduled for 09/22/21   Referring physician: Luberta Mutter MD [place], [place] [id number]  HISTORICAL INFORMATION:   Selected notes from the MEDICAL RECORD NUMBER Referred by Dr. Ellie Lunch for retinoschisis OD LEE:  Ocular Hx- HH plaque X2 OS (2018), cataracts, progressive retinoschisis OD PMH-    CURRENT MEDICATIONS: No current outpatient medications on file. (Ophthalmic Drugs)   No current facility-administered medications for this visit. (Ophthalmic Drugs)   Current Outpatient Medications (Other)  Medication Sig   ALPRAZolam (XANAX) 0.5 MG tablet Take 0.5 mg by mouth 2 (two) times daily as needed.   azelastine (ASTELIN) 0.1 % nasal spray Place 2 sprays into both nostrils 2 (two) times daily. Use in each nostril as directed   Biotin 1 MG CAPS Take 1 mg by mouth daily.   BREO ELLIPTA 200-25 MCG/INH AEPB 1 puff daily.   Dulaglutide 1.5 MG/0.5ML SOPN Inject 3 mg into the skin every Thursday.    empagliflozin (JARDIANCE) 25 MG TABS tablet Take 25 mg by mouth daily.   escitalopram (LEXAPRO) 20 MG tablet Take 20 mg by mouth at bedtime.    fluticasone (FLONASE) 50 MCG/ACT nasal spray Place 2 sprays into both nostrils 2 (two) times daily.    ibuprofen (ADVIL) 800 MG tablet Take 800 mg by mouth as needed.    ipratropium (ATROVENT) 0.06 % nasal spray    levocetirizine (XYZAL) 5 MG tablet Take 5 mg by mouth every evening.   metFORMIN (GLUCOPHAGE) 500 MG tablet Take 1,000 mg by mouth daily with supper.    montelukast (SINGULAIR) 10 MG tablet Take 10 mg by mouth at bedtime.   pantoprazole (PROTONIX) 40 MG tablet Take 1 tablet (40 mg total) by mouth 2 (two) times daily before a meal. Take 30 minutes before meals   REPATHA SURECLICK 226 MG/ML SOAJ INJECT 140MG  UNDER THE SKIN EVERY 14 DAYS   topiramate (TOPAMAX) 100 MG tablet Take 100 mg by mouth at bedtime.    ursodiol (ACTIGALL) 300 MG capsule Take 300 mg by mouth 3 (three) times daily.    aspirin 81 MG chewable tablet Chew 1 tablet (81 mg total) by mouth 2 (two) times daily. (Patient taking differently: Chew 81 mg by mouth daily. )   sucralfate (CARAFATE) 1 GM/10ML suspension Take 10 mLs (1 g total) by mouth every 6 (six) hours as needed. Be sure to take at bedtime   No current facility-administered medications for this visit. (Other)   REVIEW OF SYSTEMS: ROS   Positive for: Gastrointestinal, Neurological, Musculoskeletal, Endocrine, Eyes Negative for: Constitutional, Skin, Genitourinary, HENT, Cardiovascular, Respiratory, Psychiatric, Allergic/Imm, Heme/Lymph Last edited  by Roselee Nova D, COT on 08/22/2021  1:16 PM.     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 2014)   Past  Surgical History:  Procedure Laterality Date   ABDOMINAL HYSTERECTOMY     BREAST BIOPSY Left 2014   BREAST SURGERY Bilateral 1984   reduction mammoplasty   CARPAL TUNNEL RELEASE     CESAREAN SECTION     JOINT REPLACEMENT     REDUCTION MAMMAPLASTY Bilateral    ROTATOR CUFF REPAIR     TONSILLECTOMY     TOTAL KNEE ARTHROPLASTY Left 03/25/2019   Procedure: Left Knee Arthroplasty;  Surgeon: Melrose Nakayama, MD;  Location: [facility];  Service: Orthopedics;  Laterality: Left;   VAGINAL HYSTERECTOMY     FAMILY HISTORY Family History  Problem Relation Age of Onset   Arthritis Mother    COPD Father    Diabetes Mellitus II Father    Colon cancer Neg Hx    Liver cancer Neg Hx    Stomach cancer Neg Hx    Esophageal cancer Neg Hx    Rectal cancer Neg Hx    SOCIAL HISTORY Social History   Tobacco Use   Smoking status: Former    Packs/day: 1.00    Years: 25.00    Pack years: 25.00    Types: Cigarettes    Quit date: 09/01/2006    Years since quitting: 14.9   Smokeless tobacco: Never  Vaping Use   Vaping Use: Never used  Substance Use Topics   Alcohol use: Yes    Comment: occ   Drug use: No       OPHTHALMIC EXAM:  Base Eye Exam     Visual Acuity (Snellen - Linear)       Right Left   Dist cc 20/30 20/30 -2   Dist ph cc 20/25 20/25    Correction: Glasses         Tonometry (Tonopen, 1:24 PM)       Right Left   Pressure 17 14         Pupils       Dark Light Shape React APD   Right 4 3 Round Brisk None   Left 4 3 Round Brisk None         Visual Fields (Counting fingers)       Left Right    Full Full         Extraocular Movement       Right Left    Full, Ortho Full, Ortho         Neuro/Psych     Oriented x3: Yes   Mood/Affect: Normal         Dilation     Both eyes: 1.0% Mydriacyl, 2.5% Phenylephrine @ 1:24 PM           Slit Lamp and Fundus Exam     Slit Lamp Exam  Right Left   Lids/Lashes Dermatochalasis - upper lid, mild  MGD, Telangiectasia Dermatochalasis - upper lid, mild MGD, Telangiectasia   Conjunctiva/Sclera White and quiet White and quiet   Cornea trace PEE, trace tear film debris Trace PEE, trace tear film debris   Anterior Chamber Deep and quiet Deep and quiet   Iris Round and dilated Round and dilated   Lens 2+ Nuclear sclerosis, 2+ Cortical cataract 2-3+ Nuclear sclerosis with brunescence, 2+ Cortical cataract   Anterior Vitreous Vitreous syneresis, +fine pigment, Posterior vitreous detachment, pigmented debris settled inferiory Vitreous syneresis, no pigment, Posterior vitreous detachment, vitreous condensations         Fundus Exam       Right Left   Disc Pink and Sharp, mild tilt, PPA Pink and Sharp, tilted, inferior PPA   C/D Ratio 0.5 0.5   Macula Flat, Blunted foveal reflex, mild RPE mottling, No heme or edema Flat, Blunted foveal reflex, mild RPE mottling, No heme or edema   Vessels mild attenuation, mild tortuousity, mild AV crossing changes mild attenuation, mild tortuousity   Periphery Attached, inferior schisis at 0400-0730 with pigment clumping and pigment atrophy within schisis cavity, outer retinal hole at 0700 -- good barricade laser changes posterior to schisis; no new RT/RD Attached, very shallow schisis IT periphery           Refraction     Wearing Rx       Sphere Cylinder Axis Add   Right +1.50 +0.50 133 +2.50   Left +2.25 Sphere  +2.50           IMAGING AND PROCEDURES  Imaging and Procedures for 08/22/2021  OCT, Retina - OU - Both Eyes       Right Eye Quality was good. Central Foveal Thickness: 307. Progression has been stable. Findings include normal foveal contour, no IRF, no SRF (IT schisis with outer retinal holes caught on widefield).   Left Eye Quality was good. Central Foveal Thickness: 281. Progression has been stable. Findings include normal foveal contour, no IRF, no SRF, vitreomacular adhesion (Shallow schisis IT periphery caught on widefield).    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 CME - cystoid macular edema. PED - pigment epithelial detachment. IRF - intraretinal fluid. SRF - subretinal fluid. EZ - ellipsoid zone. ERM - epiretinal membrane. ORA - outer retinal atrophy. ORT - outer retinal tubulation. SRHM - subretinal hyper-reflective material. 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 Retinal hole of right eye  H33.321     3. Diabetes mellitus type 2 without retinopathy ([place])  E11.9     4. Combined forms of age-related cataract of both eyes  H25.813      1,2. Retinoschisis OU - OD: inferior schisis 0400-0730 with pigment clumping and pigment atrophy within schisis cavity, outer retinal hole at 0700 - OS: very shallow schisis IT periphery - pt reports intermittent photopsias and floaters OU -- likely unrelated to schisis - s/p laser retinopexy OD (01.30.23) -- good barricade laser changes posterior to schisis - completed PF QID OD x7 days - clear from a retina standpoint  to proceed with cataract surgery OU when pt and surgeon are ready, but will recheck OD in about 1 month - f/u March 15, DFE, OCT  3. Diabetes mellitus, type 2 without retinopathy - The incidence, risk factors for progression, natural history and treatment options for diabetic retinopathy  were discussed with patient.   - The need for close monitoring of blood glucose, blood pressure, and serum lipids, avoiding cigarette or any type of tobacco, and the need for long term follow up was also discussed with patient. - f/u in 1 year, sooner prn  4. Mixed Cataract OU - The symptoms of  cataract, surgical options, and treatments and risks were discussed with patient. - under the expert management of Dr. Ellie Lunch - clear from a retina standpoint to proceed with cataract surgery OU when pt and surgeon are ready - [place] scheduled for September 22, 2021   Ophthalmic Meds Ordered this visit:  No orders of the defined types were placed in this encounter.    Return for March 15, retinal hole OD, DFE, OCT.  There are no Patient Instructions on file for this visit.   Explained the diagnoses, plan, and follow up with the patient and they expressed understanding.  Patient expressed understanding of the importance of proper follow up care.   This document serves as a record of services personally performed by Gardiner Sleeper, MD, PhD. It was created on their behalf by Roselee Nova, COMT. The creation of this record is the provider's dictation and/or activities during the visit.  Electronically signed by: Roselee Nova, COMT 08/22/21 11:47 PM  Gardiner Sleeper, M.D., Ph.D. Diseases & Surgery of the Retina and Vitreous [facility]  I have reviewed the above documentation for accuracy and completeness, and I agree with the above. Gardiner Sleeper, M.D., Ph.D. 08/22/21 11:47 PM   Abbreviations: M myopia (nearsighted); A astigmatism; H hyperopia (farsighted); P presbyopia; Mrx spectacle prescription;  CTL contact lenses; OD right eye; OS left eye; OU both eyes  XT exotropia; ET esotropia; PEK punctate epithelial keratitis; PEE punctate epithelial erosions; DES dry eye syndrome; MGD meibomian gland dysfunction; ATs artificial tears; PFAT's preservative free artificial tears; [place] nuclear sclerotic cataract; PSC posterior subcapsular cataract; ERM epi-retinal membrane; PVD posterior vitreous detachment; RD retinal detachment; DM diabetes mellitus; DR diabetic retinopathy; NPDR non-proliferative diabetic retinopathy; PDR proliferative diabetic retinopathy; CSME clinically  significant macular edema; DME diabetic macular edema; dbh dot blot hemorrhages; CWS cotton wool spot; POAG primary open angle glaucoma; C/D cup-to-disc ratio; HVF humphrey visual field; GVF goldmann visual field; OCT optical coherence tomography; IOP intraocular pressure; BRVO Branch retinal vein occlusion; CRVO central retinal vein occlusion; CRAO central retinal artery occlusion; BRAO branch retinal artery occlusion; RT retinal tear; SB scleral buckle; PPV pars plana vitrectomy; VH Vitreous hemorrhage; PRP panretinal laser photocoagulation; IVK intravitreal kenalog; VMT vitreomacular traction; MH Macular hole;  NVD neovascularization of the disc; NVE neovascularization elsewhere; AREDS age related eye disease study; ARMD age related macular degeneration; POAG primary open angle glaucoma; EBMD epithelial/anterior basement membrane dystrophy; ACIOL anterior chamber intraocular lens; IOL intraocular lens; PCIOL posterior chamber intraocular lens; Phaco/IOL phacoemulsification with intraocular lens placement; [place] photorefractive keratectomy; LASIK laser assisted in situ keratomileusis; HTN hypertension; DM diabetes mellitus; COPD chronic obstructive pulmonary disease

## 2021-08-22 ENCOUNTER — Other Ambulatory Visit: Payer: Self-pay

## 2021-08-22 ENCOUNTER — Encounter (INDEPENDENT_AMBULATORY_CARE_PROVIDER_SITE_OTHER): Payer: Self-pay | Admitting: Ophthalmology

## 2021-08-22 ENCOUNTER — Ambulatory Visit (INDEPENDENT_AMBULATORY_CARE_PROVIDER_SITE_OTHER): Payer: Medicare PPO | Admitting: Ophthalmology

## 2021-08-22 DIAGNOSIS — E119 Type 2 diabetes mellitus without complications: Secondary | ICD-10-CM

## 2021-08-22 DIAGNOSIS — H33103 Unspecified retinoschisis, bilateral: Secondary | ICD-10-CM | POA: Diagnosis not present

## 2021-08-22 DIAGNOSIS — H33321 Round hole, right eye: Secondary | ICD-10-CM | POA: Diagnosis not present

## 2021-08-22 DIAGNOSIS — H25813 Combined forms of age-related cataract, bilateral: Secondary | ICD-10-CM | POA: Diagnosis not present

## 2021-09-12 NOTE — Progress Notes (Shared)
[facility] Note  09/21/2021     CHIEF COMPLAINT Patient presents for No chief complaint on file.   HISTORY OF PRESENT ILLNESS: Natasha Franco is a 71 y.o. female who presents to the clinic today for:      Referring physician: Luberta Mutter MD [place], [place] [id number]  HISTORICAL INFORMATION:   Selected notes from the MEDICAL RECORD NUMBER Referred by Dr. Ellie Lunch for retinoschisis OD LEE:  Ocular Hx- HH plaque X2 OS (2018), cataracts, progressive retinoschisis OD PMH-    CURRENT MEDICATIONS: No current outpatient medications on file. (Ophthalmic Drugs)   No current facility-administered medications for this visit. (Ophthalmic Drugs)   Current Outpatient Medications (Other)  Medication Sig   ALPRAZolam (XANAX) 0.5 MG tablet Take 0.5 mg by mouth 2 (two) times daily as needed.   aspirin 81 MG chewable tablet Chew 1 tablet (81 mg total) by mouth 2 (two) times daily. (Patient taking differently: Chew 81 mg by mouth daily. )   azelastine (ASTELIN) 0.1 % nasal spray Place 2 sprays into both nostrils 2 (two) times daily. Use in each nostril as directed   Biotin 1 MG CAPS Take 1 mg by mouth daily.   BREO ELLIPTA 200-25 MCG/INH AEPB 1 puff daily.   Dulaglutide 1.5 MG/0.5ML SOPN Inject 3 mg into the skin every Thursday.    empagliflozin (JARDIANCE) 25 MG TABS tablet Take 25 mg by mouth daily.   escitalopram (LEXAPRO) 20 MG tablet Take 20 mg by mouth at bedtime.    fluticasone (FLONASE) 50 MCG/ACT nasal spray Place 2 sprays into both nostrils 2 (two) times daily.   ibuprofen (ADVIL) 800 MG tablet Take 800 mg by mouth as needed.    ipratropium (ATROVENT) 0.06 % nasal spray    levocetirizine (XYZAL) 5 MG tablet Take 5 mg by mouth every evening.   metFORMIN (GLUCOPHAGE) 500 MG tablet Take 1,000 mg by mouth daily with supper.    montelukast (SINGULAIR) 10 MG tablet Take 10 mg by mouth at bedtime.   pantoprazole (PROTONIX) 40 MG tablet  Take 1 tablet (40 mg total) by mouth 2 (two) times daily before a meal. Take 30 minutes before meals   REPATHA SURECLICK 366 MG/ML SOAJ INJECT '140MG'$  UNDER THE SKIN EVERY 14 DAYS   sucralfate (CARAFATE) 1 GM/10ML suspension Take 10 mLs (1 g total) by mouth every 6 (six) hours as needed. Be sure to take at bedtime   topiramate (TOPAMAX) 100 MG tablet Take 100 mg by mouth at bedtime.    ursodiol (ACTIGALL) 300 MG capsule Take 300 mg by mouth 3 (three) times daily.    No current facility-administered medications for this visit. 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 (AMA (+) but biopsy negative 2014)   Past Surgical History:  Procedure Laterality Date   ABDOMINAL HYSTERECTOMY     BREAST  BIOPSY Left 2014   BREAST SURGERY Bilateral 1984   reduction mammoplasty   CARPAL TUNNEL RELEASE     CESAREAN SECTION     JOINT REPLACEMENT     REDUCTION MAMMAPLASTY Bilateral    ROTATOR CUFF REPAIR     TONSILLECTOMY     TOTAL KNEE ARTHROPLASTY Left 03/25/2019   Procedure: Left Knee Arthroplasty;  Surgeon: Melrose Nakayama, MD;  Location: [facility];  Service: Orthopedics;  Laterality: Left;   VAGINAL HYSTERECTOMY     FAMILY HISTORY Family History  Problem Relation Age of Onset   Arthritis Mother    COPD Father    Diabetes Mellitus II Father    Colon cancer Neg Hx    Liver cancer Neg Hx    Stomach cancer Neg Hx    Esophageal cancer Neg Hx    Rectal cancer Neg Hx    SOCIAL HISTORY Social  History   Tobacco Use   Smoking status: Former    Packs/day: 1.00    Years: 25.00    Pack years: 25.00    Types: Cigarettes    Quit date: 09/01/2006    Years since quitting: 15.0   Smokeless tobacco: Never  Vaping Use   Vaping Use: Never used  Substance Use Topics   Alcohol use: Yes    Comment: occ   Drug use: No       OPHTHALMIC EXAM:  Not recorded    IMAGING AND PROCEDURES  Imaging and Procedures for 09/21/2021          ASSESSMENT/PLAN:  No diagnosis found.  1,2. Retinoschisis OU - OD: inferior schisis 0400-0730 with pigment clumping and pigment atrophy within schisis cavity, outer retinal hole at 0700 - OS: very shallow schisis IT periphery - pt reports intermittent photopsias and floaters OU -- likely unrelated to schisis - s/p laser retinopexy OD (01.30.23) -- good barricade laser changes posterior to schisis - completed PF QID OD x7 days - clear from a retina standpoint to proceed with cataract surgery OU when pt and surgeon are ready, but will recheck OD in about 1 month - f/u March 15, DFE, OCT  3. Diabetes mellitus, type 2 without retinopathy - The incidence, risk factors for progression, natural history and treatment options for diabetic retinopathy  were discussed with patient.   - The need for close monitoring of blood glucose, blood pressure, and serum lipids, avoiding cigarette or any type of tobacco, and the need for long term follow up was also discussed with patient. - ff/u in 1 year, sooner prn  4. Mixed Cataract OU - The symptoms of cataract, surgical options, and treatments and risks were discussed with patient. - under the expert management of Dr. Ellie Lunch - clear from a retina standpoint to proceed with cataract surgery OU when pt and surgeon are ready - [place] scheduled for September 22, 2021   Ophthalmic Meds Ordered this visit:  No orders of the defined types were placed in this encounter.    No follow-ups on file.  There are no  Patient Instructions on file for this visit.   Explained the diagnoses, plan, and follow up with the patient and they expressed understanding.  Patient expressed understanding of the importance of proper follow up care.   This document serves as a record of services personally performed by Gardiner Sleeper, MD, PhD. It was created on their behalf by Orvan Falconer, an ophthalmic technician. The creation of this record is the provider's dictation and/or activities during the visit.  Electronically signed by: Orvan Falconer, OA, 09/12/21  10:44 AM   Gardiner Sleeper, M.D., Ph.D. Diseases & Surgery of the Retina and Vitreous [facility]  I have reviewed the above documentation for accuracy and completeness, and I agree with the above. Gardiner Sleeper, M.D., Ph.D. 08/22/21 10:44 AM   Abbreviations: M myopia (nearsighted); A astigmatism; H hyperopia (farsighted); P presbyopia; Mrx spectacle prescription;  CTL contact lenses; OD right eye; OS left eye; OU both eyes  XT exotropia; ET esotropia; PEK punctate epithelial keratitis; PEE punctate epithelial erosions; DES dry eye syndrome; MGD meibomian gland dysfunction; ATs artificial tears; PFAT's preservative free artificial tears; [place] nuclear sclerotic cataract; PSC posterior subcapsular cataract; ERM epi-retinal membrane; PVD posterior vitreous detachment; RD retinal detachment; DM diabetes mellitus; DR diabetic retinopathy; NPDR non-proliferative diabetic retinopathy; PDR proliferative diabetic retinopathy; CSME clinically significant macular edema; DME diabetic macular edema; dbh dot blot hemorrhages; CWS cotton wool spot; POAG primary open angle glaucoma; C/D cup-to-disc ratio; HVF humphrey visual field; GVF goldmann visual field; OCT optical coherence tomography; IOP intraocular pressure; BRVO Branch retinal vein occlusion; CRVO central retinal vein occlusion; CRAO central retinal artery occlusion; BRAO branch retinal artery  occlusion; RT retinal tear; SB scleral buckle; PPV pars plana vitrectomy; VH Vitreous hemorrhage; PRP panretinal laser photocoagulation; IVK intravitreal kenalog; VMT vitreomacular traction; MH Macular hole;  NVD neovascularization of the disc; NVE neovascularization elsewhere; AREDS age related eye disease study; ARMD age related macular degeneration; POAG primary open angle glaucoma; EBMD epithelial/anterior basement membrane dystrophy; ACIOL anterior chamber intraocular lens; IOL intraocular lens; PCIOL posterior chamber intraocular lens; Phaco/IOL phacoemulsification with intraocular lens placement; [place] photorefractive keratectomy; LASIK laser assisted in situ keratomileusis; HTN hypertension; DM diabetes mellitus; COPD chronic obstructive pulmonary disease

## 2021-09-21 ENCOUNTER — Other Ambulatory Visit: Payer: Self-pay

## 2021-09-21 ENCOUNTER — Encounter (INDEPENDENT_AMBULATORY_CARE_PROVIDER_SITE_OTHER): Payer: Self-pay | Admitting: Ophthalmology

## 2021-09-21 ENCOUNTER — Ambulatory Visit (INDEPENDENT_AMBULATORY_CARE_PROVIDER_SITE_OTHER): Payer: Medicare PPO | Admitting: Ophthalmology

## 2021-09-21 DIAGNOSIS — H33321 Round hole, right eye: Secondary | ICD-10-CM

## 2021-09-21 DIAGNOSIS — H33103 Unspecified retinoschisis, bilateral: Secondary | ICD-10-CM | POA: Diagnosis not present

## 2021-09-21 DIAGNOSIS — E119 Type 2 diabetes mellitus without complications: Secondary | ICD-10-CM

## 2021-09-21 DIAGNOSIS — H25813 Combined forms of age-related cataract, bilateral: Secondary | ICD-10-CM

## 2021-09-23 ENCOUNTER — Encounter (INDEPENDENT_AMBULATORY_CARE_PROVIDER_SITE_OTHER): Payer: Self-pay | Admitting: Ophthalmology

## 2021-10-25 ENCOUNTER — Other Ambulatory Visit
Admission: RE | Admit: 2021-10-25 | Discharge: 2021-10-25 | Disposition: A | Discharge status: Home / Self Care | Payer: Medicare PPO | Attending: Medical | Admitting: Medical

## 2021-10-25 DIAGNOSIS — E7800 Pure hypercholesterolemia, unspecified: Secondary | ICD-10-CM | POA: Insufficient documentation

## 2021-10-25 DIAGNOSIS — E119 Type 2 diabetes mellitus without complications: Secondary | ICD-10-CM | POA: Diagnosis present

## 2021-10-25 LAB — LIPID PANEL
Cholesterol: 143 mg/dL (ref 0–200)
HDL: 54 mg/dL (ref >40)
LDL Cholesterol: 77 mg/dL (ref 0–99)
Total CHOL/HDL Ratio: 2.6 RATIO
Triglycerides: 58 mg/dL (ref <150)
VLDL: 12 mg/dL (ref 0–40)

## 2021-10-25 LAB — CBC WITH DIFFERENTIAL/PLATELET
Abs Immature Granulocytes: 0.03 10*3/uL (ref 0.00–0.07)
Basophils Absolute: 0.1 10*3/uL (ref 0.0–0.1)
Basophils Relative: 1 %
Eosinophils Absolute: 0.2 10*3/uL (ref 0.0–0.5)
Eosinophils Relative: 3 %
HCT: 44.8 % (ref 36.0–46.0)
Hemoglobin: 14.6 g/dL (ref 12.0–15.0)
Immature Granulocytes: 0 %
Lymphocytes Relative: 33 %
Lymphs Abs: 2.9 10*3/uL (ref 0.7–4.0)
MCH: 28.3 pg (ref 26.0–34.0)
MCHC: 32.6 g/dL (ref 30.0–36.0)
MCV: 87 fL (ref 80.0–100.0)
Monocytes Absolute: 0.8 10*3/uL (ref 0.1–1.0)
Monocytes Relative: 9 %
Neutro Abs: 4.6 10*3/uL (ref 1.7–7.7)
Neutrophils Relative %: 54 %
Platelets: 331 10*3/uL (ref 150–400)
RBC: 5.15 MIL/uL — ABNORMAL HIGH (ref 3.87–5.11)
RDW: 13.7 % (ref 11.5–15.5)
WBC: 8.6 10*3/uL (ref 4.0–10.5)
nRBC: 0 % (ref 0.0–0.2)

## 2021-10-25 LAB — COMPREHENSIVE METABOLIC PANEL
ALT: 38 U/L (ref 0–44)
AST: 34 U/L (ref 15–41)
Albumin: 3.8 g/dL (ref 3.5–5.0)
Alkaline Phosphatase: 118 U/L (ref 38–126)
Anion gap: 8 (ref 5–15)
BUN: 20 mg/dL (ref 8–23)
CO2: 23 mmol/L (ref 22–32)
Calcium: 8.8 mg/dL — ABNORMAL LOW (ref 8.9–10.3)
Chloride: 106 mmol/L (ref 98–111)
Creatinine, Ser: 0.68 mg/dL (ref 0.44–1.00)
GFR, Estimated: >60 mL/min (ref >60)
Glucose, Bld: 149 mg/dL — ABNORMAL HIGH (ref 70–99)
Potassium: 4.1 mmol/L (ref 3.5–5.1)
Sodium: 137 mmol/L (ref 135–145)
Total Bilirubin: 0.8 mg/dL (ref 0.3–1.2)
Total Protein: 7.6 g/dL (ref 6.5–8.1)

## 2021-10-25 LAB — HEMOGLOBIN A1C
Hgb A1c MFr Bld: 6.6 % — ABNORMAL HIGH (ref 4.8–5.6)
Mean Plasma Glucose: 142.72 mg/dL

## 2021-11-03 ENCOUNTER — Ambulatory Visit: Payer: Medicare PPO | Admitting: Plastic Surgery

## 2021-11-03 VITALS — BP 106/52 | HR 68 | Ht 63.0 in | Wt 178.6 lb

## 2021-11-03 DIAGNOSIS — Z411 Encounter for cosmetic surgery: Secondary | ICD-10-CM | POA: Diagnosis not present

## 2021-11-03 DIAGNOSIS — H02834 Dermatochalasis of left upper eyelid: Secondary | ICD-10-CM | POA: Diagnosis not present

## 2021-11-03 DIAGNOSIS — H02831 Dermatochalasis of right upper eyelid: Secondary | ICD-10-CM

## 2021-11-03 NOTE — Progress Notes (Signed)
? ?Referring Provider ?Goldfuss, Sharlette Dense, PA-C ?[address] ?[place],  [place] [address]  ? ?CC:  ?Chief Complaint  ?Patient presents with  ? Advice Only  ?   ? ?Natasha Franco is an 71 y.o. female.  ?HPI: Patient presents to discuss her upper eyelids.  She is bothered by excess skin that is wearing down her lashes and affecting the superior visual field.  She has had a visual field study from her eye doctor that supports this.  She is additionally concerned with her lower eye bags and the jowl area and would like to discuss what could be done to treat those concerns. ? ?Allergies  ?Allergen Reactions  ? Invokana [Canagliflozin]   ?  VAGINAL YEAST INFECTIONS  ? Statins   ? Versed [Midazolam]   ?  "Makes my mouth run"  ? Zetia [Ezetimibe] Cough  ? Bupropion Hcl Rash  ? ? ?Outpatient Encounter Medications as of 11/03/2021  ?Medication Sig  ? ALPRAZolam (XANAX) 0.5 MG tablet Take 0.5 mg by mouth 2 (two) times daily as needed.  ? aspirin 81 MG chewable tablet Chew 1 tablet (81 mg total) by mouth 2 (two) times daily. (Patient taking differently: Chew 81 mg by mouth daily. )  ? azelastine (ASTELIN) 0.1 % nasal spray Place 2 sprays into both nostrils 2 (two) times daily. Use in each nostril as directed  ? Biotin 1 MG CAPS Take 1 mg by mouth daily.  ? BREO ELLIPTA 200-25 MCG/INH AEPB 1 puff daily.  ? Dulaglutide 1.5 MG/0.5ML SOPN Inject 3 mg into the skin every Thursday.   ? empagliflozin (JARDIANCE) 25 MG TABS tablet Take 25 mg by mouth daily.  ? escitalopram (LEXAPRO) 20 MG tablet Take 20 mg by mouth at bedtime.   ? fluticasone (FLONASE) 50 MCG/ACT nasal spray Place 2 sprays into both nostrils 2 (two) times daily.  ? ibuprofen (ADVIL) 800 MG tablet Take 800 mg by mouth as needed.   ? ipratropium (ATROVENT) 0.06 % nasal spray   ? levocetirizine (XYZAL) 5 MG tablet Take 5 mg by mouth every evening.  ? metFORMIN (GLUCOPHAGE) 500 MG tablet Take 1,000 mg by mouth daily with supper.   ? montelukast (SINGULAIR) 10 MG tablet Take 10  mg by mouth at bedtime.  ? pantoprazole (PROTONIX) 40 MG tablet Take 1 tablet (40 mg total) by mouth 2 (two) times daily before a meal. Take 30 minutes before meals  ? REPATHA SURECLICK 809 MG/ML SOAJ INJECT '140MG'$  UNDER THE SKIN EVERY 14 DAYS  ? sucralfate (CARAFATE) 1 GM/10ML suspension Take 10 mLs (1 g total) by mouth every 6 (six) hours as needed. Be sure to take at bedtime  ? topiramate (TOPAMAX) 100 MG tablet Take 100 mg by mouth at bedtime.   ? ursodiol (ACTIGALL) 300 MG capsule Take 300 mg by mouth 3 (three) times daily.   ? ?No facility-administered encounter medications on file as of 11/03/2021.  ?  ? ?Past Medical History:  ?Diagnosis Date  ? Arthritis   ? Barrett's esophagus   ? Complication of anesthesia   ? Diabetes mellitus without complication ([place])   ? GERD (gastroesophageal reflux disease)   ? Hyperlipidemia   ? Per pt  ? Migraines   ? Primary biliary cirrhosis ([place])   ? suspected (AMA (+) but biopsy negative 2014)  ? ? ?Past Surgical History:  ?Procedure Laterality Date  ? ABDOMINAL HYSTERECTOMY    ? BREAST BIOPSY Left 2014  ? BREAST SURGERY Bilateral 1984  ?  reduction mammoplasty  ? CARPAL TUNNEL RELEASE    ? CESAREAN SECTION    ? JOINT REPLACEMENT    ? REDUCTION MAMMAPLASTY Bilateral   ? ROTATOR CUFF REPAIR    ? TONSILLECTOMY    ? TOTAL KNEE ARTHROPLASTY Left 03/25/2019  ? Procedure: Left Knee Arthroplasty;  Surgeon: Melrose Nakayama, MD;  Location: [facility];  Service: Orthopedics;  Laterality: Left;  ? VAGINAL HYSTERECTOMY    ? ? ?Family History  ?Problem Relation Age of Onset  ? Arthritis Mother   ? COPD Father   ? Diabetes Mellitus II Father   ? Colon cancer Neg Hx   ? Liver cancer Neg Hx   ? Stomach cancer Neg Hx   ? Esophageal cancer Neg Hx   ? Rectal cancer Neg Hx   ? ? ?Social History  ? ?Social History Narrative  ? Not on file  ?  ? ?Review of Systems ?General: Denies fevers, chills, weight loss ?CV: Denies chest pain, shortness of breath, palpitations ? ?Physical Exam ? ?  09/18/2019  ?  9:25  AM 08/05/2019  ? 10:12 AM 08/05/2019  ? 10:02 AM  ?Vitals with BMI  ?Height '5\' 3"'$     ?Weight 180 lbs    ?BMI 31.89    ?Systolic 811 031 594  ?Diastolic 60 55 51  ?Pulse 78 62 68  ?  ?General:  No acute distress,  Alert and oriented, Non-Toxic, Normal speech and affect ?HEENT: Patient has what looks like an appropriate brow position.  She has significant dermatochalasis on both sides with skin resting on the lashes.  MRD 1 is symmetric and looks to be about 2.5 mm.  She looks to have normal levator function.  Lower lids are resting just above the limbus and have an appropriate snapback test.  She does have moderate to severe lower lid bags with a prominent tear trough.  She has moderate jowling and moderate laxity and platysmal banding in the neck.  Cranial nerves grossly intact.  No obvious scars. ? ?Assessment/Plan ?We discussed bilateral upper lid blepharoplasty for her symptomatic upper eyelid dermatochalasis.  For the lower lids I think she would be a good candidate for transconjunctival fat removal with release of the tear trough ligament combined with a skin pinch and superficial retinacular canthopexy.  To address the jowls and neck I recommended full face and neck lift.  We discussed the risks and benefits of the procedure.  We discussed the location and orientation of the scars.  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  We also discussed fat grafting as an adjunct to the facelift and lower Bleph.  She is interested in moving forward and we will plan to provide a quote for her and seek insurance authorization for the upper lids. ? ?Cindra Presume ?11/03/2021, 12:06 PM  ? ? ?  ?

## 2021-11-03 NOTE — Addendum Note (Signed)
Addended by: Threasa Beards K on: 11/03/2021 02:12 PM ? ? Modules accepted: Orders ? ?

## 2021-11-17 ENCOUNTER — Telehealth: Payer: Self-pay

## 2021-11-17 NOTE — Telephone Encounter (Signed)
Pt left voicemail stating that she received a notice from her insurance company stating that her claim was denied due to an incorrect code used for DOS 4/27. She stated the code used for the visit lists as [facility]. According to ov note, DX code is encounter for cosmetic Px, H02.831 and dermatochalasis of both upper lids, H02.834. ? ?I wasn't sure who to send this to since Sabba is no longer with [place]. This is a [facility] patient.  ?

## 2021-11-23 ENCOUNTER — Encounter: Payer: Self-pay | Admitting: Plastic Surgery

## 2021-11-23 ENCOUNTER — Ambulatory Visit: Payer: Self-pay | Admitting: Plastic Surgery

## 2021-11-23 ENCOUNTER — Telehealth: Payer: Self-pay | Admitting: Plastic Surgery

## 2021-11-23 DIAGNOSIS — Z411 Encounter for cosmetic surgery: Secondary | ICD-10-CM

## 2021-11-23 DIAGNOSIS — H02834 Dermatochalasis of left upper eyelid: Secondary | ICD-10-CM

## 2021-11-23 DIAGNOSIS — H02831 Dermatochalasis of right upper eyelid: Secondary | ICD-10-CM

## 2021-11-23 NOTE — Progress Notes (Signed)
Patient presents to discuss what measurements need to be taking regarding getting insurance approval for her upper lid blepharoplasty.  While we had thought she had already had a visual field test it sounds like she may not have actually had that.  Her exam from her last visit with me should be enough in terms of insurance requirements but we need to move forward with a visual field exam.  After talking about that with her today we will do our best to facilitate that.  All of her questions were answered. ?

## 2021-11-23 NOTE — Telephone Encounter (Signed)
Called patient to find out who her eye doctor was so that I could have a referral placed and she said she had an appt scheduled for 6/9. I advised her to have those results faxed to our office so that we can begin the authorization.  ? ?In regard to the previous call she made about her insurance denying authorization due to cpt/dx codes, I advised her I have not begun an authorization because I do not have the results so that denial must be related to another office trying to get an authorization. Pt stated ok.  ?

## 2021-12-24 ENCOUNTER — Other Ambulatory Visit: Payer: Self-pay | Admitting: Interventional Cardiology

## 2021-12-24 DIAGNOSIS — E782 Mixed hyperlipidemia: Secondary | ICD-10-CM

## 2021-12-29 ENCOUNTER — Other Ambulatory Visit: Payer: Self-pay | Admitting: Interventional Cardiology

## 2021-12-29 DIAGNOSIS — E782 Mixed hyperlipidemia: Secondary | ICD-10-CM

## 2021-12-30 ENCOUNTER — Telehealth: Payer: Self-pay | Admitting: Interventional Cardiology

## 2021-12-30 ENCOUNTER — Telehealth: Payer: Self-pay

## 2021-12-30 DIAGNOSIS — E782 Mixed hyperlipidemia: Secondary | ICD-10-CM

## 2021-12-30 MED ORDER — REPATHA SURECLICK 140 MG/ML ~~LOC~~ SOAJ: SUBCUTANEOUS | 3 refills | Status: DC

## 2021-12-30 NOTE — Telephone Encounter (Signed)
Refill sent in

## 2021-12-30 NOTE — Telephone Encounter (Signed)
Advised patient, notes and photos have been sent to her insurance company for pre-authorization.

## 2022-01-02 ENCOUNTER — Telehealth: Payer: Self-pay

## 2022-02-01 NOTE — Progress Notes (Signed)
[facility] Note  02/06/2022     CHIEF COMPLAINT Patient presents for Retina Follow Up   HISTORY OF PRESENT ILLNESS: Natasha Franco is a 71 y.o. female who presents to the clinic today for:   HPI     Retina Follow Up   Patient presents with  Other.  In both eyes.  Severity is moderate.  Duration of 4 months.  Since onset it is stable.  I, the attending physician,  performed the HPI with the patient and updated documentation appropriately.        Comments   Pt here for 4 mo ret f/u for bilat schisis OU. Pt states VA is the same, no changes. Pt did have cataract sx OU in March/April by Dr. Ellie Lunch. Pt is pleased with the results.       Last edited by Bernarda Caffey, MD on 02/06/2022 12:29 PM.    Pt had cataract sx with Dr. Ellie Lunch in March/April, she states vision is doing good, she says her right eye has a "tiny, little fuzzy spot", she last saw Dr. Ellie Lunch about a month ago   Referring physician: Luberta Mutter MD [place], [place] [address]  HISTORICAL INFORMATION:   Selected notes from the [facility] Referred by Dr. Ellie Lunch for retinoschisis OD LEE:  Ocular Hx- HH plaque X2 OS (2018), cataracts, progressive retinoschisis OD PMH-    CURRENT MEDICATIONS: Current Outpatient Medications (Ophthalmic Drugs)  Medication Sig   Bromfenac Sodium (PROLENSA) 0.07 % SOLN Place 1 drop into the right eye 4 (four) times daily.   ketorolac (ACULAR) 0.5 % ophthalmic solution Place 1 drop into the right eye 4 (four) times daily.   prednisoLONE acetate (PRED FORTE) 1 % ophthalmic suspension Place 1 drop into the right eye 4 (four) times daily.   No current facility-administered medications for this visit. (Ophthalmic Drugs)   Current Outpatient Medications (Other)  Medication Sig   ALPRAZolam (XANAX) 0.5 MG tablet Take 0.5 mg by mouth 2 (two) times daily as needed.   azelastine (ASTELIN) 0.1 % nasal spray Place 2 sprays into both  nostrils 2 (two) times daily. Use in each nostril as directed   Biotin 1 MG CAPS Take 1 mg by mouth daily.   BREO ELLIPTA 200-25 MCG/INH AEPB 1 puff daily.   empagliflozin (JARDIANCE) 25 MG TABS tablet Take 25 mg by mouth daily.   escitalopram (LEXAPRO) 20 MG tablet Take 20 mg by mouth at bedtime.    Evolocumab (REPATHA SURECLICK) 371 MG/ML SOAJ INJECT 140MG UNDER THE SKIN EVERY 14 DAYS   fluticasone (FLONASE) 50 MCG/ACT nasal spray Place 2 sprays into both nostrils 2 (two) times daily.   ibuprofen (ADVIL) 800 MG tablet Take 800 mg by mouth as needed.    ipratropium (ATROVENT) 0.06 % nasal spray    levocetirizine (XYZAL) 5 MG tablet Take 5 mg by mouth every evening.   metFORMIN (GLUCOPHAGE) 500 MG tablet Take 1,000 mg by mouth daily with supper.    montelukast (SINGULAIR) 10 MG tablet Take 10 mg by mouth at bedtime.   pantoprazole (PROTONIX) 40 MG tablet Take 1 tablet (40 mg total) by mouth 2 (two) times daily before a meal. Take 30 minutes before meals   Semaglutide, 1 MG/DOSE, (OZEMPIC, 1 MG/DOSE,) 4 MG/3ML SOPN 81m   topiramate (TOPAMAX) 100 MG tablet Take 100 mg by mouth at bedtime.    ursodiol (ACTIGALL) 300 MG capsule Take 300 mg by mouth 3 (three) times  daily.    No current facility-administered medications for this visit. (Other)   REVIEW OF SYSTEMS: ROS   Positive for: Gastrointestinal, Neurological, Musculoskeletal, Endocrine, Eyes Negative for: Constitutional, Skin, Genitourinary, HENT, Cardiovascular, Respiratory, Psychiatric, Allergic/Imm, Heme/Lymph Last edited by Kingsley Spittle, COT on 02/06/2022  7:57 AM.     ALLERGIES Allergies  Allergen Reactions   Invokana [Canagliflozin]     VAGINAL YEAST INFECTIONS   Statins    Versed [Midazolam]     "Makes my mouth run"   Zetia [Ezetimibe] Cough   Bupropion Hcl Rash   PAST MEDICAL HISTORY Past Medical History:  Diagnosis Date   Arthritis    Barrett's esophagus    Complication of anesthesia    Diabetes mellitus  without complication ([place])    GERD (gastroesophageal reflux disease)    Hyperlipidemia    Per pt   Migraines    Primary biliary cirrhosis ([place])    suspected (AMA (+) but biopsy negative 2014)   Past Surgical History:  Procedure Laterality Date   ABDOMINAL HYSTERECTOMY     BREAST BIOPSY Left 2014   BREAST SURGERY Bilateral 1984   reduction mammoplasty   CARPAL TUNNEL RELEASE     CESAREAN SECTION     JOINT REPLACEMENT     REDUCTION MAMMAPLASTY Bilateral    ROTATOR CUFF REPAIR     TONSILLECTOMY     TOTAL KNEE ARTHROPLASTY Left 03/25/2019   Procedure: Left Knee Arthroplasty;  Surgeon: Melrose Nakayama, MD;  Location: [facility];  Service: Orthopedics;  Laterality: Left;   VAGINAL HYSTERECTOMY     FAMILY HISTORY Family History  Problem Relation Age of Onset   Arthritis Mother    COPD Father    Diabetes Mellitus II Father    Colon cancer Neg Hx    Liver cancer Neg Hx    Stomach cancer Neg Hx    Esophageal cancer Neg Hx    Rectal cancer Neg Hx    SOCIAL HISTORY Social History   Tobacco Use   Smoking status: Former    Packs/day: 1.00    Years: 25.00    Total pack years: 25.00    Types: Cigarettes    Quit date: 09/01/2006    Years since quitting: 15.4   Smokeless tobacco: Never  Vaping Use   Vaping Use: Never used  Substance Use Topics   Alcohol use: Yes    Comment: occ   Drug use: No       OPHTHALMIC EXAM:  Base Eye Exam     Visual Acuity (Snellen - Linear)       Right Left   Dist [place] 20/25 20/20   Dist ph [place] NI     Correction: Glasses         Tonometry (Tonopen, 8:05 AM)       Right Left   Pressure 15 14         Pupils       Dark Light Shape React APD   Right 3 2 Round Minimal None   Left 2 1 Round Brisk None         Visual Fields (Counting fingers)       Left Right    Full Full         Extraocular Movement       Right Left    Full, Ortho Full, Ortho         Neuro/Psych     Oriented x3: Yes   Mood/Affect: Normal  Dilation     Both eyes: 1.0% Mydriacyl, 2.5% Phenylephrine @ 8:05 AM           Slit Lamp and Fundus Exam     Slit Lamp Exam       Right Left   Lids/Lashes Dermatochalasis - upper lid, mild MGD, Telangiectasia Dermatochalasis - upper lid, mild MGD, Telangiectasia   Conjunctiva/Sclera White and quiet White and quiet   Cornea trace PEE, mild tear film debris trace PEE, mild tear film debris   Anterior Chamber Deep, 0.5+ cell / pigment Deep, 0.5+ cell / pigment   Iris Round and dilated Round and dilated   Lens PC IOL in good position PC IOL in good position   Anterior Vitreous Vitreous syneresis, +fine pigment, Posterior vitreous detachment, pigmented debris settled inferiory, vitreous condensations Vitreous syneresis, no pigment, Posterior vitreous detachment, vitreous condensations         Fundus Exam       Right Left   Disc Pink and Sharp, mild tilt, PPA Pink and Sharp, tilted, inferior PPA   C/D Ratio 0.5 0.5   Macula Flat, Blunted foveal reflex, central edema / CME, mild RPE mottling, No heme Flat, Blunted foveal reflex, mild RPE mottling, No heme or edema   Vessels attenuated, mild copper wiring, mild AV crossing changes attenuated, mild tortuosity   Periphery Attached, inferior schisis from 0400-0730 with pigment clumping and pigment atrophy within schisis cavity, outer retinal hole at 0700 -- good barricade laser changes posterior to schisis; no new RT/RD or schisis Attached, very shallow schisis IT periphery           IMAGING AND PROCEDURES  Imaging and Procedures for 02/06/2022  OCT, Retina - OU - Both Eyes       Right Eye Quality was good. Central Foveal Thickness: 616. Progression has worsened. Findings include abnormal foveal contour, intraretinal fluid, subretinal fluid (Interval development of IRF and sliver of SRF centrally -- CME, IT schisis with outer retinal holes caught on widefield).   Left Eye Quality was good. Central Foveal Thickness: 292.  Progression has been stable. Findings include normal foveal contour, no IRF, no SRF, vitreomacular adhesion (Shallow schisis IT periphery caught on widefield - not imaged today).   Notes *Images captured and stored on drive  Diagnosis / Impression:  OD: Interval development of IRF and sliver of SRF centrally -- CME, IT schisis with outer retinal holes caught on widefield OS: Shallow schisis IT periphery caught on widefield -- not imaged today  Clinical management:  See below  Abbreviations: NFP - Normal foveal profile. CME - cystoid macular edema. PED - pigment epithelial detachment. IRF - intraretinal fluid. SRF - subretinal fluid. EZ - ellipsoid zone. ERM - epiretinal membrane. ORA - outer retinal atrophy. ORT - outer retinal tubulation. SRHM - subretinal hyper-reflective material. IRHM - intraretinal hyper-reflective material            ASSESSMENT/PLAN:    ICD-10-CM   1. 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 Diabetes mellitus type 2 without retinopathy ([place])  E11.9     5. Pseudophakia, both eyes  Z96.1      1. CME OD - interval development of IRF and sliver of SRF centrally -- consistent with CME - suspect delayed [place], s/p CEIOL with Dr.  [name] in March - will start PF and Prolensa QID OD  - f/u 4 weeks, DFE, OCT  2,3. Retinoschisis OU - OD: inferior schisis 0400-0730 with pigment clumping and pigment atrophy within schisis cavity, outer retinal hole at 0700 - OS: very shallow schisis IT periphery - pt reports intermittent photopsias and floaters OU -- likely unrelated to schisis - s/p laser retinopexy OD (01.30.23) -- good barricade laser changes posterior to schisis - f/u 3-4 months, DFE, OCT  4. Diabetes mellitus, type 2 without retinopathy - The incidence, risk factors for progression, natural history and  treatment options for diabetic retinopathy  were discussed with patient.   - The need for close monitoring of blood glucose, blood pressure, and serum lipids, avoiding cigarette or any type of tobacco, and the need for long term follow up was also discussed with patient. - f/u in 1 year, sooner prn  5. Pseudophakia OU  - s/p CE/IOL OU (Dr. Ellie Lunch, March / April 2023)  - IOLs in perfect position, doing well  - mild CME OD as above  - monitor  Ophthalmic Meds Ordered this visit:  Meds ordered this encounter  Medications   prednisoLONE acetate (PRED FORTE) 1 % ophthalmic suspension    Sig: Place 1 drop into the right eye 4 (four) times daily.    Dispense:  10 mL    Refill:  0   Bromfenac Sodium (PROLENSA) 0.07 % SOLN    Sig: Place 1 drop into the right eye 4 (four) times daily.    Dispense:  6 mL    Refill:  2   ketorolac (ACULAR) 0.5 % ophthalmic solution    Sig: Place 1 drop into the right eye 4 (four) times daily.    Dispense:  5 mL    Refill:  3     Return in about 4 weeks (around 03/06/2022) for f/u CME OD, DFE, OCT.  There are no Patient Instructions on file for this visit.   Explained the diagnoses, plan, and follow up with the patient and they expressed understanding.  Patient expressed understanding of the importance of proper follow up care.   This document serves as a record of services personally performed by Gardiner Sleeper, MD, PhD. It was created on their behalf by Roselee Nova, COMT. The creation of this record is the provider's dictation and/or activities during the visit.  Electronically signed by: Roselee Nova, COMT 02/06/22 12:32 PM  This document serves as a record of services personally performed by Gardiner Sleeper, MD, PhD. It was created on their behalf by San Jetty. Owens Shark, OA an ophthalmic technician. The creation of this record is the provider's dictation and/or activities during the visit.    Electronically signed by: San Jetty. Owens Shark, [place] 07.31.2023 12:32  PM  Gardiner Sleeper, M.D., Ph.D. Diseases & Surgery of the Retina and Vitreous [facility]  I have reviewed the above documentation for accuracy and completeness, and I agree with the above. Gardiner Sleeper, M.D., Ph.D. 02/06/22 12:32 PM   Abbreviations: M myopia (nearsighted); A astigmatism; H hyperopia (farsighted); P presbyopia; Mrx spectacle prescription;  CTL contact lenses; OD right eye; OS left eye; OU both eyes  XT exotropia; ET esotropia; PEK punctate epithelial keratitis; PEE punctate epithelial erosions; DES dry eye syndrome; MGD meibomian gland dysfunction; ATs artificial tears; PFAT's preservative free artificial tears; [place] nuclear sclerotic cataract; PSC posterior subcapsular cataract; ERM epi-retinal membrane; PVD posterior vitreous detachment; RD retinal detachment; DM diabetes mellitus;  DR diabetic retinopathy; NPDR non-proliferative diabetic retinopathy; PDR proliferative diabetic retinopathy; CSME clinically significant macular edema; DME diabetic macular edema; dbh dot blot hemorrhages; CWS cotton wool spot; POAG primary open angle glaucoma; C/D cup-to-disc ratio; HVF humphrey visual field; GVF goldmann visual field; OCT optical coherence tomography; IOP intraocular pressure; BRVO Branch retinal vein occlusion; CRVO central retinal vein occlusion; CRAO central retinal artery occlusion; BRAO branch retinal artery occlusion; RT retinal tear; SB scleral buckle; PPV pars plana vitrectomy; VH Vitreous hemorrhage; PRP panretinal laser photocoagulation; IVK intravitreal kenalog; VMT vitreomacular traction; MH Macular hole;  NVD neovascularization of the disc; NVE neovascularization elsewhere; AREDS age related eye disease study; ARMD age related macular degeneration; POAG primary open angle glaucoma; EBMD epithelial/anterior basement membrane dystrophy; ACIOL anterior chamber intraocular lens; IOL intraocular lens; PCIOL posterior chamber intraocular lens; Phaco/IOL  phacoemulsification with intraocular lens placement; [place] photorefractive keratectomy; LASIK laser assisted in situ keratomileusis; HTN hypertension; DM diabetes mellitus; COPD chronic obstructive pulmonary disease

## 2022-02-06 ENCOUNTER — Encounter (INDEPENDENT_AMBULATORY_CARE_PROVIDER_SITE_OTHER): Payer: Self-pay | Admitting: Ophthalmology

## 2022-02-06 ENCOUNTER — Ambulatory Visit (INDEPENDENT_AMBULATORY_CARE_PROVIDER_SITE_OTHER): Payer: Medicare PPO | Admitting: Ophthalmology

## 2022-02-06 DIAGNOSIS — H35351 Cystoid macular degeneration, right eye: Secondary | ICD-10-CM | POA: Diagnosis not present

## 2022-02-06 DIAGNOSIS — H33321 Round hole, right eye: Secondary | ICD-10-CM

## 2022-02-06 DIAGNOSIS — E119 Type 2 diabetes mellitus without complications: Secondary | ICD-10-CM

## 2022-02-06 DIAGNOSIS — H25813 Combined forms of age-related cataract, bilateral: Secondary | ICD-10-CM

## 2022-02-06 DIAGNOSIS — Z961 Presence of intraocular lens: Secondary | ICD-10-CM | POA: Diagnosis not present

## 2022-02-06 DIAGNOSIS — H33103 Unspecified retinoschisis, bilateral: Secondary | ICD-10-CM

## 2022-02-06 MED ORDER — KETOROLAC TROMETHAMINE 0.5 % OP SOLN: 1.0000 [drp] | Freq: Four times a day (QID) | OPHTHALMIC | 3 refills | Status: DC

## 2022-02-06 MED ORDER — PREDNISOLONE ACETATE 1 % OP SUSP: 1.0000 [drp] | Freq: Four times a day (QID) | OPHTHALMIC | 0 refills | Status: DC

## 2022-02-06 MED ORDER — PROLENSA 0.07 % OP SOLN: 1.0000 [drp] | Freq: Four times a day (QID) | OPHTHALMIC | 2 refills | Status: DC

## 2022-02-15 NOTE — Progress Notes (Signed)
[facility] Note  02/28/2022     CHIEF COMPLAINT Patient presents for Retina Follow Up   HISTORY OF PRESENT ILLNESS: Natasha Franco is a 71 y.o. female who presents to the clinic today for:   HPI     Retina Follow Up   Patient presents with  Other.  In right eye.  Severity is moderate.  Duration of 4 weeks.  Since onset it is gradually improving.  I, the attending physician,  performed the HPI with the patient and updated documentation appropriately.        Comments   Pt here for 4 wk ret f/u CME OD. Pt states VA in OD has improved w/ drop usage. Blur spot has dissipated.       Last edited by Bernarda Caffey, MD on 03/01/2022  2:16 AM.     Pt states vision has improved a little   Referring physician: Luberta Mutter MD [place], [place] [address]  HISTORICAL INFORMATION:   Selected notes from the MEDICAL RECORD NUMBER Referred by Dr. Ellie Lunch for retinoschisis OD LEE:  Ocular Hx- HH plaque X2 OS (2018), cataracts, progressive retinoschisis OD PMH-    CURRENT MEDICATIONS: Current Outpatient Medications (Ophthalmic Drugs)  Medication Sig   ketorolac (ACULAR) 0.5 % ophthalmic solution Place 1 drop into the right eye 4 (four) times daily.   prednisoLONE acetate (PRED FORTE) 1 % ophthalmic suspension Place 1 drop into the right eye 4 (four) times daily.   No current facility-administered medications for this visit. (Ophthalmic Drugs)   Current Outpatient Medications (Other)  Medication Sig   ALPRAZolam (XANAX) 0.5 MG tablet Take 0.5 mg by mouth 2 (two) times daily as needed.   azelastine (ASTELIN) 0.1 % nasal spray Place 2 sprays into both nostrils 2 (two) times daily. Use in each nostril as directed   Biotin 1 MG CAPS Take 1 mg by mouth daily.   BREO ELLIPTA 200-25 MCG/INH AEPB 1 puff daily.   Cholecalciferol (VITAMIN D3 PO) Take 2,000 Units by mouth daily at 6 (six) AM.   empagliflozin (JARDIANCE) 25 MG TABS tablet Take 1  tablet (25 mg total) by mouth daily.   escitalopram (LEXAPRO) 20 MG tablet Take 20 mg by mouth at bedtime.    estradiol (ESTRACE VAGINAL) 0.1 MG/GM vaginal cream daily at 6 (six) AM.   Evolocumab (REPATHA SURECLICK) 741 MG/ML SOAJ INJECT 140MG UNDER THE SKIN EVERY 14 DAYS   fluticasone (FLONASE) 50 MCG/ACT nasal spray Place 2 sprays into both nostrils 2 (two) times daily.   ipratropium (ATROVENT) 0.06 % nasal spray    levocetirizine (XYZAL) 5 MG tablet Take 5 mg by mouth every evening.   Magnesium 300 MG CAPS Take 1 capsule by mouth daily at 6 (six) AM.   meloxicam (MOBIC) 15 MG tablet Take 0.5-1 tablets (7.5-15 mg total) by mouth daily. With food.   metFORMIN (GLUCOPHAGE) 1000 MG tablet Take 1 tablet (1,000 mg total) by mouth daily with breakfast.   montelukast (SINGULAIR) 10 MG tablet Take 10 mg by mouth at bedtime.   pantoprazole (PROTONIX) 40 MG tablet Take 1 tablet (40 mg total) by mouth 2 (two) times daily before a meal. Take 30 minutes before meals   Semaglutide, 1 MG/DOSE, (OZEMPIC, 1 MG/DOSE,) 4 MG/3ML SOPN 23m   topiramate (TOPAMAX) 100 MG tablet Take 100 mg by mouth at bedtime.    traZODone (DESYREL) 50 MG tablet Take 1 tablet by mouth at bedtime.   ursodiol (ACTIGALL)  300 MG capsule Take 1 capsule (300 mg total) by mouth 2 (two) times daily.   No current facility-administered medications for this visit. (Other)   REVIEW OF SYSTEMS: ROS   Positive for: Gastrointestinal, Neurological, Musculoskeletal, Endocrine, Eyes Negative for: Constitutional, Skin, Genitourinary, HENT, Cardiovascular, Respiratory, Psychiatric, Allergic/Imm, Heme/Lymph Last edited by Kingsley Spittle, COT on 02/28/2022  8:43 AM.     ALLERGIES Allergies  Allergen Reactions   Invokana [Canagliflozin]     VAGINAL YEAST INFECTIONS   Statins     LFT elevation   Versed [Midazolam]     Talkative after med use   Zetia [Ezetimibe] Cough   Bupropion Hcl Rash   PAST MEDICAL HISTORY Past Medical History:   Diagnosis Date   Arthritis    Barrett's esophagus    Chicken pox    Complication of anesthesia    Diabetes mellitus without complication ([place])    Diverticulitis    Family history of polyps in the colon    GERD (gastroesophageal reflux disease)    Hay fever    Hepatitis    Hyperlipidemia    Per pt   Incontinent of urine    Migraines    Primary biliary cirrhosis ([place])    suspected (AMA (+) but biopsy negative 2014)   Past Surgical History:  Procedure Laterality Date   BLADDER SUSPENSION     with mesh erosion in vaginal vault.   BREAST BIOPSY Left 2014   BREAST SURGERY Bilateral 1984   reduction mammoplasty   CARPAL TUNNEL RELEASE     CESAREAN SECTION     JOINT REPLACEMENT     REDUCTION MAMMAPLASTY Bilateral    ROTATOR CUFF REPAIR     TONSILLECTOMY     TOTAL KNEE ARTHROPLASTY Left 03/25/2019   Procedure: Left Knee Arthroplasty;  Surgeon: Melrose Nakayama, MD;  Location: [facility];  Service: Orthopedics;  Laterality: Left;   VAGINAL HYSTERECTOMY     FAMILY HISTORY Family History  Problem Relation Age of Onset   Arthritis Mother    Early death Father    Diabetes Father    Alcohol abuse Father    COPD Father    Diabetes Mellitus II Father    Heart attack Father    Mental illness Father    Alcohol abuse Sister    Stroke Maternal Grandmother    Stroke Maternal Grandfather    Diabetes Maternal Grandfather    Diabetes Paternal Grandfather    Depression Daughter    Hypotension Daughter        POTS   Colon cancer Neg Hx    Liver cancer Neg Hx    Stomach cancer Neg Hx    Esophageal cancer Neg Hx    Rectal cancer Neg Hx    Breast cancer Neg Hx    SOCIAL HISTORY Social History   Tobacco Use   Smoking status: Former    Packs/day: 1.00    Years: 25.00    Total pack years: 25.00    Types: Cigarettes    Quit date: 09/01/2006    Years since quitting: 15.5   Smokeless tobacco: Never  Vaping Use   Vaping Use: Never used  Substance Use Topics   Alcohol use: Yes     Comment: occ   Drug use: No       OPHTHALMIC EXAM:  Base Eye Exam     Visual Acuity (Snellen - Linear)       Right Left   Dist [place] 20/30 -2 20/20   Dist ph   NI     Correction: Glasses         Tonometry (Tonopen, 8:51 AM)       Right Left   Pressure 17 12         Pupils       Dark Light Shape React APD   Right 3 2 Round Minimal None   Left 2 1 Round Brisk None         Visual Fields (Counting fingers)       Left Right    Full Full         Extraocular Movement       Right Left    Full, Ortho Full, Ortho         Neuro/Psych     Oriented x3: Yes   Mood/Affect: Normal         Dilation     Both eyes: 1.0% Mydriacyl, 2.5% Phenylephrine @ 8:52 AM           Slit Lamp and Fundus Exam     Slit Lamp Exam       Right Left   Lids/Lashes Dermatochalasis - upper lid, mild MGD, Telangiectasia Dermatochalasis - upper lid, mild MGD, Telangiectasia   Conjunctiva/Sclera White and quiet White and quiet   Cornea 1+ fine Punctate epithelial erosions inferiorly, tear film debris, well healed cataract wound trace PEE, mild tear film debris, well healed cataract wound   Anterior Chamber deep and clear, no cell or flare deep and clear   Iris Round and dilated Round and dilated   Lens PC IOL in good position PC IOL in good position   Anterior Vitreous Vitreous syneresis, +fine pigment, Posterior vitreous detachment, pigmented debris settled inferiory, vitreous condensations Vitreous syneresis, no pigment, Posterior vitreous detachment, vitreous condensations         Fundus Exam       Right Left   Disc Pink and Sharp, mild tilt, inferior PPA Pink and Sharp, tilted, inferior PPA   C/D Ratio 0.5 0.5   Macula Flat, Blunted foveal reflex, central edema / CME -- persistent, but slightly improved, mild RPE mottling, No heme Flat, Blunted foveal reflex, mild RPE mottling, No heme or edema   Vessels attenuated, mild copper wiring, mild AV crossing changes  attenuated, mild tortuosity   Periphery Attached, inferior schisis from 0400-0730 with pigment clumping and pigment atrophy within schisis cavity, outer retinal hole at 0700 -- good barricade laser changes posterior to schisis; no new RT/RD or schisis Attached, very shallow schisis IT periphery           Refraction     Manifest Refraction       Sphere Cylinder Axis Dist VA   Right -0.75 +0.25 130 20/30-2   Left               IMAGING AND PROCEDURES  Imaging and Procedures for 02/28/2022  OCT, Retina - OU - Both Eyes       Right Eye Quality was good. Central Foveal Thickness: 548. Progression has improved. Findings include abnormal foveal contour, intraretinal fluid, subretinal fluid (Mild interval improvement in IRF and sliver of SRF centrally -- CME, IT schisis with outer retinal holes caught on widefield).   Left Eye Quality was good. Central Foveal Thickness: 290. Progression has been stable. Findings include normal foveal contour, no IRF, no SRF, vitreomacular adhesion (Shallow schisis IT periphery caught on widefield - not imaged today).   Notes *Images captured and stored on drive  Diagnosis / Impression:  OD: Mild interval improvement in IRF and sliver of SRF centrally -- CME, IT schisis with outer retinal holes caught on widefield OS: NFP; no IRF/SRF; Shallow schisis IT periphery caught on widefield -- not imaged today  Clinical management:  See below  Abbreviations: NFP - Normal foveal profile. CME - cystoid macular edema. PED - pigment epithelial detachment. IRF - intraretinal fluid. SRF - subretinal fluid. EZ - ellipsoid zone. ERM - epiretinal membrane. ORA - outer retinal atrophy. ORT - outer retinal tubulation. SRHM - subretinal hyper-reflective material. IRHM - intraretinal hyper-reflective material             ASSESSMENT/PLAN:    ICD-10-CM   1. Cystoid macular edema of right eye  H35.351 OCT, Retina - OU - Both Eyes    2. Bilateral retinoschisis   H33.103     3. 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 Diabetes mellitus type 2 without retinopathy ([place])  E11.9     5. Pseudophakia, both eyes  Z96.1       1. CME OD - interval development of IRF and sliver of SRF centrally -- consistent with CME, noted on 07.31.23 visit - suspect delayed [place], s/p CEIOL with Dr. Ellie Lunch in March - started on PF and Ketorolac QID OD on 07.31.23 - OCT shows mild interval improvement in IRF/SRF/CME, but BCVA 20/30 from 20/25 - discussed findings and possible sub-[place]'s kenalog - pt wishes to hold off on STK and continue topical PF and Ketorolac for now  - f/u 4 weeks, DFE, OCT  2,3. Retinoschisis OU - OD: inferior schisis 0400-0730 with pigment clumping and pigment atrophy within schisis cavity, outer retinal hole at 0700 - OS: very shallow schisis IT periphery - pt reports intermittent photopsias and floaters OU -- likely unrelated to schisis - s/p laser retinopexy OD (01.30.23) -- good barricade laser changes posterior to schisis - f/u 3-4 months, DFE, OCT  4. Diabetes mellitus, type 2 without retinopathy - The incidence, risk factors for progression, natural history and treatment options for diabetic retinopathy  were discussed with patient.   - The need for close monitoring of blood glucose, blood pressure, and serum lipids, avoiding cigarette or any type of tobacco, and the need for long term follow up was also discussed with patient. - f/u in 1 year, sooner prn  5. Pseudophakia OU  - s/p CE/IOL OU (Dr. Ellie Lunch, March / April 2023)  - IOLs in perfect position, doing well  - mild CME OD as above  - monitor  Ophthalmic Meds Ordered this visit:  No orders of the defined types were placed in this encounter.    Return in about 4 weeks (around 03/28/2022) for f/u CME OD, DFE, OCT.  There are no Patient Instructions on file for this visit.   Explained the diagnoses, plan, and follow up with the patient and they expressed  understanding.  Patient expressed understanding of the importance of proper follow up care.   This document serves as a record of services personally performed by Gardiner Sleeper, MD, PhD. It was created on their behalf by Renaldo Reel, [place] an ophthalmic technician. The creation of this record is the provider's dictation and/or activities during the visit.    Electronically signed by:  Renaldo Reel, COT  02/15/22 2:16 AM   This document serves as a record of services personally performed by Gardiner Sleeper, MD, PhD. It was created on their behalf by San Jetty. Owens Shark, OA an ophthalmic technician. The  creation of this record is the provider's dictation and/or activities during the visit.    Electronically signed by: San Jetty. Owens Shark, [place] 08.22.2023 2:16 AM  Gardiner Sleeper, M.D., Ph.D. Diseases & Surgery of the Retina and Vitreous [facility]  I have reviewed the above documentation for accuracy and completeness, and I agree with the above. Gardiner Sleeper, M.D., Ph.D. 03/01/22 2:20 AM  Abbreviations: M myopia (nearsighted); A astigmatism; H hyperopia (farsighted); P presbyopia; Mrx spectacle prescription;  CTL contact lenses; OD right eye; OS left eye; OU both eyes  XT exotropia; ET esotropia; PEK punctate epithelial keratitis; PEE punctate epithelial erosions; DES dry eye syndrome; MGD meibomian gland dysfunction; ATs artificial tears; PFAT's preservative free artificial tears; [place] nuclear sclerotic cataract; PSC posterior subcapsular cataract; ERM epi-retinal membrane; PVD posterior vitreous detachment; RD retinal detachment; DM diabetes mellitus; DR diabetic retinopathy; NPDR non-proliferative diabetic retinopathy; PDR proliferative diabetic retinopathy; CSME clinically significant macular edema; DME diabetic macular edema; dbh dot blot hemorrhages; CWS cotton wool spot; POAG primary open angle glaucoma; C/D cup-to-disc ratio; HVF humphrey visual field; GVF goldmann  visual field; OCT optical coherence tomography; IOP intraocular pressure; BRVO Branch retinal vein occlusion; CRVO central retinal vein occlusion; CRAO central retinal artery occlusion; BRAO branch retinal artery occlusion; RT retinal tear; SB scleral buckle; PPV pars plana vitrectomy; VH Vitreous hemorrhage; PRP panretinal laser photocoagulation; IVK intravitreal kenalog; VMT vitreomacular traction; MH Macular hole;  NVD neovascularization of the disc; NVE neovascularization elsewhere; AREDS age related eye disease study; ARMD age related macular degeneration; POAG primary open angle glaucoma; EBMD epithelial/anterior basement membrane dystrophy; ACIOL anterior chamber intraocular lens; IOL intraocular lens; PCIOL posterior chamber intraocular lens; Phaco/IOL phacoemulsification with intraocular lens placement; [place] photorefractive keratectomy; LASIK laser assisted in situ keratomileusis; HTN hypertension; DM diabetes mellitus; COPD chronic obstructive pulmonary disease

## 2022-02-18 ENCOUNTER — Other Ambulatory Visit
Admission: RE | Admit: 2022-02-18 | Discharge: 2022-02-18 | Disposition: A | Discharge status: Home / Self Care | Payer: Medicare PPO | Source: Ambulatory Visit | Attending: Medical | Admitting: Medical

## 2022-02-18 DIAGNOSIS — E119 Type 2 diabetes mellitus without complications: Secondary | ICD-10-CM | POA: Diagnosis present

## 2022-02-18 LAB — COMPREHENSIVE METABOLIC PANEL
ALT: 31 U/L (ref 0–44)
AST: 23 U/L (ref 15–41)
Albumin: 3.9 g/dL (ref 3.5–5.0)
Alkaline Phosphatase: 111 U/L (ref 38–126)
Anion gap: 6 (ref 5–15)
BUN: 25 mg/dL — ABNORMAL HIGH (ref 8–23)
CO2: 21 mmol/L — ABNORMAL LOW (ref 22–32)
Calcium: 8.8 mg/dL — ABNORMAL LOW (ref 8.9–10.3)
Chloride: 110 mmol/L (ref 98–111)
Creatinine, Ser: 0.64 mg/dL (ref 0.44–1.00)
GFR, Estimated: >60 mL/min (ref >60)
Glucose, Bld: 113 mg/dL — ABNORMAL HIGH (ref 70–99)
Potassium: 4.3 mmol/L (ref 3.5–5.1)
Sodium: 137 mmol/L (ref 135–145)
Total Bilirubin: 0.7 mg/dL (ref 0.3–1.2)
Total Protein: 7.3 g/dL (ref 6.5–8.1)

## 2022-02-19 LAB — HEMOGLOBIN A1C
Hgb A1c MFr Bld: 5.7 % — ABNORMAL HIGH (ref 4.8–5.6)
Mean Plasma Glucose: 116.89 mg/dL

## 2022-02-21 NOTE — Progress Notes (Unsigned)
Cardiology Office Note:    Date:  02/22/2022   ID:  Natasha Franco, DOB 1950-12-12, MRN [id number]  PCP:  Natasha Halsted, PA-C  Cardiologist:  Natasha Grooms, MD   Referring MD: Natasha Franco, [place]*   Chief Complaint  Patient presents with   Chest Pain   Coronary Artery Disease    History of Present Illness:    Natasha Franco is a 71 y.o. female with a hx of diabetes mellitus II, aortic atherosclerosis, prior smoking history, hypertension, hyperlipidemia, and recurring atypical chest discomfort that awaken her from sleep.  She is doing well.  No chest pain.  She is retired from work.  Hemoglobin A1c is less than 6.  She is getting physical activity.  [place] and will be living in [place] most of the time.  Past Medical History:  Diagnosis Date   Arthritis    Barrett's esophagus    Complication of anesthesia    Diabetes mellitus without complication (HCC)    GERD (gastroesophageal reflux disease)    Hyperlipidemia    Per pt   Migraines    Primary biliary cirrhosis ([place])    suspected (AMA (+) but biopsy negative 2014)    Past Surgical History:  Procedure Laterality Date   ABDOMINAL HYSTERECTOMY     BREAST BIOPSY Left 2014   BREAST SURGERY Bilateral 1984   reduction mammoplasty   CARPAL TUNNEL RELEASE     CESAREAN SECTION     JOINT REPLACEMENT     REDUCTION MAMMAPLASTY Bilateral    ROTATOR CUFF REPAIR     TONSILLECTOMY     TOTAL KNEE ARTHROPLASTY Left 03/25/2019   Procedure: Left Knee Arthroplasty;  Surgeon: Natasha Nakayama, MD;  Location: [facility];  Service: Orthopedics;  Laterality: Left;   VAGINAL HYSTERECTOMY      Current Medications: Current Meds  Medication Sig   ALPRAZolam (XANAX) 0.5 MG tablet Take 0.5 mg by mouth 2 (two) times daily as needed.   azelastine (ASTELIN) 0.1 % nasal spray Place 2 sprays into both nostrils 2 (two) times daily. Use in each nostril as directed   Biotin 1 MG CAPS Take 1 mg by mouth daily.    BREO ELLIPTA 200-25 MCG/INH AEPB 1 puff daily.   Bromfenac Sodium (PROLENSA) 0.07 % SOLN Place 1 drop into the right eye 4 (four) times daily.   empagliflozin (JARDIANCE) 25 MG TABS tablet Take 25 mg by mouth daily.   escitalopram (LEXAPRO) 20 MG tablet Take 20 mg by mouth at bedtime.    estradiol (ESTRACE VAGINAL) 0.1 MG/GM vaginal cream daily at 6 (six) AM.   Evolocumab (REPATHA SURECLICK) 732 MG/ML SOAJ INJECT '140MG'$  UNDER THE SKIN EVERY 14 DAYS   fluticasone (FLONASE) 50 MCG/ACT nasal spray Place 2 sprays into both nostrils 2 (two) times daily.   ibuprofen (ADVIL) 800 MG tablet Take 800 mg by mouth as needed.    ipratropium (ATROVENT) 0.06 % nasal spray    ketorolac (ACULAR) 0.5 % ophthalmic solution Place 1 drop into the right eye 4 (four) times daily.   levocetirizine (XYZAL) 5 MG tablet Take 5 mg by mouth every evening.   metFORMIN (GLUCOPHAGE) 500 MG tablet Take 1,000 mg by mouth daily with supper.    montelukast (SINGULAIR) 10 MG tablet Take 10 mg by mouth at bedtime.   pantoprazole (PROTONIX) 40 MG tablet Take 1 tablet (40 mg total) by mouth 2 (two) times daily before a meal. Take 30 minutes before  meals   prednisoLONE acetate (PRED FORTE) 1 % ophthalmic suspension Place 1 drop into the right eye 4 (four) times daily.   Semaglutide, 1 MG/DOSE, (OZEMPIC, 1 MG/DOSE,) 4 MG/3ML SOPN '1mg'$    topiramate (TOPAMAX) 100 MG tablet Take 100 mg by mouth at bedtime.    ursodiol (ACTIGALL) 300 MG capsule Take 300 mg by mouth 3 (three) times daily.      Allergies:   Invokana [canagliflozin], Statins, Versed [midazolam], Zetia [ezetimibe], and Bupropion hcl   Social History   Socioeconomic History   Marital status: Married    Spouse name: Not on file   Number of children: 2   Years of education: Not on file   Highest education level: Not on file  Occupational History   Occupation: DOSHER  Tobacco Use   Smoking status: Former    Packs/day: 1.00    Years: 25.00    Total pack years: 25.00     Types: Cigarettes    Quit date: 09/01/2006    Years since quitting: 15.4   Smokeless tobacco: Never  Vaping Use   Vaping Use: Never used  Substance and Sexual Activity   Alcohol use: Yes    Comment: occ   Drug use: No   Sexual activity: Not on file  Other Topics Concern   Not on file  Social History Narrative   Not on file   Social Determinants of Health   Financial Resource Strain: Not on file  Food Insecurity: Not on file  Transportation Needs: Not on file  Physical Activity: Not on file  Stress: Not on file  Social Connections: Not on file     Family History: The patient's family history includes Arthritis in her mother; COPD in her father; Diabetes Mellitus II in her father. There is no history of Colon cancer, Liver cancer, Stomach cancer, Esophageal cancer, or Rectal cancer.  ROS:   Please see the history of present illness.    No real problems that she is spoken of.  She did ask about her EKG.  She will be establishing with Dr. Damita Franco.  All other systems reviewed and are negative.  EKGs/Labs/Other Studies Reviewed:    The following studies were reviewed today:   NUCLEAR STRESS TEST 2020: Study Highlights    The left ventricular ejection fraction is normal (55-65%). Nuclear stress EF: 65%. Blood pressure demonstrated a blunted response to exercise. Horizontal ST segment depression ST segment depression was noted during stress in the II and III leads, beginning at 5 minutes of stress, and returning to baseline after 5-9 minutes of recovery. The study is normal. This is a low risk study.   Ischemic ECG changes with normal perfusion images. This suggests no significant ischemia.   EKG:  EKG normal sinus rhythm with nonspecific T wave flattening.  EKG reveals no acute abnormality.  Recent Labs: 10/25/2021: Hemoglobin 14.6; Platelets 331 02/18/2022: ALT 31; BUN 25; Creatinine, Ser 0.64; Potassium 4.3; Sodium 137  Recent Lipid Panel    Component Value Date/Time    CHOL 143 10/25/2021 0812   CHOL 191 12/16/2018 0715   TRIG 58 10/25/2021 0812   HDL 54 10/25/2021 0812   HDL 36 (L) 12/16/2018 0715   CHOLHDL 2.6 10/25/2021 0812   VLDL 12 10/25/2021 0812   LDLCALC 77 10/25/2021 0812   LDLCALC 130 (H) 12/16/2018 0715    Physical Exam:    VS:  Ht '5\' 3"'$  (1.6 m)   BMI 31.64 kg/m     Wt Readings from Last 3  Encounters:  11/03/21 178 lb 9.6 oz (81 kg)  09/18/19 180 lb (81.6 kg)  08/05/19 187 lb (84.8 kg)     GEN: Overweight. No acute distress HEENT: Normal NECK: No JVD. LYMPHATICS: No lymphadenopathy CARDIAC: Soft scratchy 6/6-0/6 systolic murmur. RRR no gallop, or edema. VASCULAR:  Normal Pulses. No bruits. RESPIRATORY:  Clear to auscultation without rales, wheezing or rhonchi  ABDOMEN: Soft, non-tender, non-distended, No pulsatile mass, MUSCULOSKELETAL: No deformity  SKIN: Warm and dry NEUROLOGIC:  Alert and oriented x 3 PSYCHIATRIC:  Normal affect   ASSESSMENT:    1. Mixed hyperlipidemia   2. Aortic atherosclerosis ([place])   3. Type 2 diabetes mellitus with stage 1 chronic kidney disease, without long-term current use of insulin ([place])   4. Cigarette smoker   5. Primary biliary cirrhosis (HCC)    PLAN:    In order of problems listed above:  Continue current therapy including Repatha. Continue Repatha.  Consider baby aspirin 81 mg/day. A1c being adequately treated with Jardiance, Glucophage, and Ozempic. Does not smoke    Overall education and awareness concerning primary/secondary risk prevention was discussed in detail: LDL less than 70, hemoglobin A1c less than 7, blood pressure target less than 130/80 mmHg, >150 minutes of moderate aerobic activity per week, avoidance of smoking, weight control (via diet and exercise), and continued surveillance/management of/for obstructive sleep apnea.  1 year follow-up with new provider.   Medication Adjustments/Labs and Tests Ordered: Current medicines are reviewed at length with the  patient today.  Concerns regarding medicines are outlined above.  No orders of the defined types were placed in this encounter.  No orders of the defined types were placed in this encounter.   There are no Patient Instructions on file for this visit.   Signed, Natasha Grooms, MD  02/22/2022 2:12 PM    [facility]

## 2022-02-22 ENCOUNTER — Ambulatory Visit: Payer: Medicare PPO | Admitting: Interventional Cardiology

## 2022-02-22 ENCOUNTER — Encounter: Payer: Self-pay | Admitting: Interventional Cardiology

## 2022-02-22 VITALS — BP 100/56 | HR 90 | Ht 63.0 in | Wt 176.0 lb

## 2022-02-22 DIAGNOSIS — K743 Primary biliary cirrhosis: Secondary | ICD-10-CM | POA: Diagnosis not present

## 2022-02-22 DIAGNOSIS — E1122 Type 2 diabetes mellitus with diabetic chronic kidney disease: Secondary | ICD-10-CM

## 2022-02-22 DIAGNOSIS — E782 Mixed hyperlipidemia: Secondary | ICD-10-CM | POA: Diagnosis not present

## 2022-02-22 DIAGNOSIS — F17210 Nicotine dependence, cigarettes, uncomplicated: Secondary | ICD-10-CM | POA: Diagnosis not present

## 2022-02-22 DIAGNOSIS — I700 Atherosclerosis of aorta: Secondary | ICD-10-CM | POA: Diagnosis not present

## 2022-02-22 DIAGNOSIS — N181 Chronic kidney disease, stage 1: Secondary | ICD-10-CM

## 2022-02-22 NOTE — Patient Instructions (Signed)

## 2022-02-23 ENCOUNTER — Encounter: Payer: Self-pay | Admitting: Family Medicine

## 2022-02-23 ENCOUNTER — Ambulatory Visit: Payer: Medicare PPO | Admitting: Family Medicine

## 2022-02-23 VITALS — BP 102/62 | HR 80 | Temp 97.6°F | Ht 61.5 in | Wt 173.0 lb

## 2022-02-23 DIAGNOSIS — E119 Type 2 diabetes mellitus without complications: Secondary | ICD-10-CM

## 2022-02-23 DIAGNOSIS — F419 Anxiety disorder, unspecified: Secondary | ICD-10-CM

## 2022-02-23 DIAGNOSIS — M19049 Primary osteoarthritis, unspecified hand: Secondary | ICD-10-CM

## 2022-02-23 DIAGNOSIS — Z0000 Encounter for general adult medical examination without abnormal findings: Secondary | ICD-10-CM

## 2022-02-23 DIAGNOSIS — K2270 Barrett's esophagus without dysplasia: Secondary | ICD-10-CM

## 2022-02-23 DIAGNOSIS — K743 Primary biliary cirrhosis: Secondary | ICD-10-CM

## 2022-02-23 DIAGNOSIS — Z7189 Other specified counseling: Secondary | ICD-10-CM

## 2022-02-23 DIAGNOSIS — H5702 Anisocoria: Secondary | ICD-10-CM

## 2022-02-23 MED ORDER — URSODIOL 300 MG PO CAPS: 300.0000 mg | ORAL_CAPSULE | Freq: Two times a day (BID) | ORAL | 3 refills | Status: DC

## 2022-02-23 MED ORDER — EMPAGLIFLOZIN 25 MG PO TABS: 25.0000 mg | ORAL_TABLET | Freq: Every day | ORAL | 3 refills | Status: DC

## 2022-02-23 MED ORDER — METFORMIN HCL 1000 MG PO TABS: 1000.0000 mg | ORAL_TABLET | Freq: Every day | ORAL | 3 refills | Status: DC

## 2022-02-23 MED ORDER — MELOXICAM 15 MG PO TABS: 7.5000 mg | ORAL_TABLET | Freq: Every day | ORAL | 1 refills | Status: DC

## 2022-02-23 NOTE — Progress Notes (Signed)
New patient to est care.  S/p liver biopsy for PBC.  Still actigall at baseline, taken BID.  History of LFT elevation on statins.  She had been using ibuprofen up to '800mg'$  qd prn for hand arthritis.  Voltaren gel didn't help.  Had prev used celebrex intermittently.  D/w pt about trial of meloxicam.  Routine NSAID cautions discussed with patient.  Migraines.  Longstanding. Episodic, improved with Topamax.  Compliant.  No ADE on med.  Insomnia.  Improved with Trazodone.  Barrett's with f/u with GI pending.  Still on PPI BID.  Compliant.  Anxiety.  Improved with Lexapro with as needed alprazolam.  Uses trazodone as needed at bedtime.  No ADE on med.  Compliant.  Diabetes:  Using medications without difficulties: yes Hypoglycemic episodes:no Hyperglycemic episodes:no Feet problems:no Blood Sugars averaging: usually ~120 eye exam within last year: yes Labs d/w pt.   Health maintenance discussed with patient Husband designated patient were incapacitated. Tetanus shot 2013 First Shingrix shot previously done. Pneumonia vaccine up-to-date. COVID shot previously done. Flu shot yearly. Colonoscopy 2021 Pap not indicated. DEXA 2021 Mammogram 2022  PMH and SH reviewed  Meds, vitals, and allergies reviewed.   ROS: Per HPI unless specifically indicated in ROS section   GEN: nad, alert and oriented HEENT: ncat, she has a pupil asymmetry where the right pupil is larger compared to the left.  She never noted this before.  Both pupils are equally reactive and she has no other focal neurologic symptoms.  She did recently use an eyedrop right before the office visit and it is likely that contributes.  See plan. NECK: supple w/o LA CV: rrr. PULM: ctab, no inc wob ABD: soft, +bs EXT: no edema SKIN: no acute rash IP joint deg changes B   Diabetic foot exam: Normal inspection No skin breakdown No calluses  Normal DP pulses Normal sensation to light touch but mild dec to  monofilament Nails normal  35 minutes were devoted to patient care in this encounter (this includes time spent reviewing the patient's file/history, interviewing and examining the patient, counseling/reviewing plan with patient).

## 2022-02-23 NOTE — Patient Instructions (Addendum)
Plan on recheck in about 3-4 months with A1c at the visit.  Take care.  Glad to see you. If you have trouble with low sugars then let me know.  Stop ibuprofen and try meloxicam with food.

## 2022-02-26 ENCOUNTER — Encounter: Payer: Self-pay | Admitting: Family Medicine

## 2022-02-26 DIAGNOSIS — Z7189 Other specified counseling: Secondary | ICD-10-CM | POA: Insufficient documentation

## 2022-02-26 DIAGNOSIS — Z0000 Encounter for general adult medical examination without abnormal findings: Secondary | ICD-10-CM | POA: Insufficient documentation

## 2022-02-26 DIAGNOSIS — K2270 Barrett's esophagus without dysplasia: Secondary | ICD-10-CM | POA: Insufficient documentation

## 2022-02-26 NOTE — Progress Notes (Incomplete)
New patient to est care.  S/p liver biopsy for PBC.  Still actigall at baseline, taken BID.  History of LFT elevation on statins.  She had been using ibuprofen up to '800mg'$  qd prn for hand arthritis.  Voltaren gel didn't help.  Had prev used celebrex intermittently.  D/w pt about trial of meloxicam.  Routine NSAID cautions discussed with patient.  Migraines.  Longstanding. Episodic, improved with Topamax.  Compliant.  No ADE on med. Insomnia.    Barrett's with f/u with GI pending.  Still on PPI BID.  Compliant.  Anxiety.  Improved with Lexapro with as needed alprazolam.  Uses trazodone as needed at bedtime.  No ADE on med.  Compliant.  Diabetes:  Using medications without difficulties: yes Hypoglycemic episodes:no Hyperglycemic episodes:no Feet problems:no Blood Sugars averaging: usually ~120 eye exam within last year: yes Labs d/w pt.   Health maintenance discussed with patient Husband designated patient were incapacitated. Tetanus shot 2013 First Shingrix shot previously done. Pneumonia vaccine up-to-date. COVID shot previously done. Flu shot yearly. Colonoscopy 2021 Pap not indicated. DEXA 2021 Mammogram 2022  PMH and SH reviewed  Meds, vitals, and allergies reviewed.   ROS: Per HPI unless specifically indicated in ROS section   GEN: nad, alert and oriented HEENT: ncat, she has a pupil asymmetry where the right pupil is larger compared to the left.  She never noted this before.  Both pupils are equally reactive and she has no other focal neurologic symptoms.  She did recently use an eyedrop right before the office visit and it is likely that contributes.  See plan. NECK: supple w/o LA CV: rrr. PULM: ctab, no inc wob ABD: soft, +bs EXT: no edema SKIN: no acute rash IP joint deg changes B   Diabetic foot exam: Normal inspection No skin breakdown No calluses  Normal DP pulses Normal sensation to light touch but mild dec to monofilament Nails normal

## 2022-02-26 NOTE — Assessment & Plan Note (Signed)
Husband designated patient were incapacitated.

## 2022-02-26 NOTE — Assessment & Plan Note (Signed)
We can recheck LFTs periodically.  Continue Actigall.

## 2022-02-26 NOTE — Assessment & Plan Note (Signed)
Improved with Lexapro with as needed alprazolam.  Uses trazodone as needed at bedtime.  No ADE on med.  Compliant.  Would continue Lexapro alprazolam and trazodone as is.

## 2022-02-26 NOTE — Assessment & Plan Note (Signed)
Health maintenance discussed with patient Husband designated patient were incapacitated. Tetanus shot 2013 First Shingrix shot previously done. Pneumonia vaccine up-to-date. COVID shot previously done. Flu shot yearly. Colonoscopy 2021 Pap not indicated. DEXA 2021 Mammogram 2022

## 2022-02-27 DIAGNOSIS — H5702 Anisocoria: Secondary | ICD-10-CM | POA: Insufficient documentation

## 2022-02-27 DIAGNOSIS — M19049 Primary osteoarthritis, unspecified hand: Secondary | ICD-10-CM | POA: Insufficient documentation

## 2022-02-27 NOTE — Assessment & Plan Note (Signed)
A1c not elevated.  Discussed potentially tapering medications.  We opted to continue as is but if she has any low sugars she can let me know.  Recheck in a few months with A1c at the visit.

## 2022-02-27 NOTE — Assessment & Plan Note (Signed)
She can try stopping other NSAIDs and change to meloxicam with routine NSAID cautions and update me as needed.

## 2022-02-27 NOTE — Assessment & Plan Note (Signed)
This may be med related and she can monitor it at home to see if it resolves prior to her next dose of medication.

## 2022-02-27 NOTE — Assessment & Plan Note (Signed)
Continue pantoprazole. °

## 2022-02-28 ENCOUNTER — Encounter (INDEPENDENT_AMBULATORY_CARE_PROVIDER_SITE_OTHER): Payer: Self-pay | Admitting: Ophthalmology

## 2022-02-28 ENCOUNTER — Ambulatory Visit (INDEPENDENT_AMBULATORY_CARE_PROVIDER_SITE_OTHER): Payer: Medicare PPO | Admitting: Ophthalmology

## 2022-02-28 DIAGNOSIS — Z961 Presence of intraocular lens: Secondary | ICD-10-CM | POA: Diagnosis not present

## 2022-02-28 DIAGNOSIS — H35351 Cystoid macular degeneration, right eye: Secondary | ICD-10-CM | POA: Diagnosis not present

## 2022-02-28 DIAGNOSIS — H33321 Round hole, right eye: Secondary | ICD-10-CM

## 2022-02-28 DIAGNOSIS — E119 Type 2 diabetes mellitus without complications: Secondary | ICD-10-CM

## 2022-02-28 DIAGNOSIS — H33103 Unspecified retinoschisis, bilateral: Secondary | ICD-10-CM | POA: Diagnosis not present

## 2022-03-17 ENCOUNTER — Encounter: Payer: Self-pay | Admitting: Family Medicine

## 2022-03-17 ENCOUNTER — Ambulatory Visit: Payer: Medicare PPO | Admitting: Family Medicine

## 2022-03-17 VITALS — BP 108/58 | HR 76 | Temp 97.8°F | Ht 61.5 in | Wt 173.2 lb

## 2022-03-17 DIAGNOSIS — R051 Acute cough: Secondary | ICD-10-CM | POA: Diagnosis not present

## 2022-03-17 DIAGNOSIS — U071 COVID-19: Secondary | ICD-10-CM | POA: Diagnosis not present

## 2022-03-17 LAB — POC COVID19 BINAXNOW: SARS Coronavirus 2 Ag: POSITIVE — AB

## 2022-03-17 LAB — POCT INFLUENZA A/B
Influenza A, POC: NEGATIVE
Influenza B, POC: NEGATIVE

## 2022-03-17 MED ORDER — NIRMATRELVIR/RITONAVIR (PAXLOVID)TABLET: 3.0000 | ORAL_TABLET | Freq: Two times a day (BID) | ORAL | 0 refills | Status: DC

## 2022-03-17 MED ORDER — NIRMATRELVIR/RITONAVIR (PAXLOVID)TABLET: 3.0000 | ORAL_TABLET | Freq: Two times a day (BID) | ORAL | 0 refills | Status: AC

## 2022-03-17 NOTE — Progress Notes (Signed)
Subjective:    Patient ID: Natasha Franco, female    DOB: 22-Feb-1951, 71 y.o.   MRN: [id number]  HPI 71 yo pt of Dr Damita Dunnings presents for nasal congestion and headache/ flu like symptoms  She is a remote former smoker and diabetic   Wt Readings from Last 3 Encounters:  03/17/22 173 lb 4 oz (78.6 kg)  02/23/22 173 lb (78.5 kg)  02/22/22 176 lb (79.8 kg)   32.21 kg/m  Cold symptoms Hit her like a ton of bricks yesterday (returned from a trip /flight on Monday night was in [place])   Feverish- but no fever today  Achey   Very congested  Some dry cough / just started a little phlegm this am/ clear  No sob  No wheezing   Congestion  Feels like sinus headache  Throat --fine Ears are ok    No covid test yet  Is immunized   Pulse ox today is 97% on RA   Uses breo ellipta -for "allergies" - ? Asthma  Also xyzal and atrovent and singulair   Covid test is positive today  Results for orders placed or performed in visit on 03/17/22  POCT Influenza A/B  Result Value Ref Range   Influenza A, POC Negative Negative   Influenza B, POC Negative Negative  POC COVID-19  Result Value Ref Range   SARS Coronavirus 2 Ag Positive (A) Negative     Lab Results  Component Value Date   CREATININE 0.64 02/18/2022   BUN 25 (H) 02/18/2022   NA 137 02/18/2022   K 4.3 02/18/2022   CL 110 02/18/2022   CO2 21 (L) 02/18/2022  GFR over 60   Otc: Takes meloxicam daily  Took some acetaminophen - does not want to push it  Her regular allergy meds Used [place] crud med- benadryl and guaifen are in it     Has primary biliary cirrhosis  Lab Results  Component Value Date   ALT 31 02/18/2022   AST 23 02/18/2022   ALKPHOS 111 02/18/2022   BILITOT 0.7 02/18/2022    Patient Active Problem List   Diagnosis Date Noted   COVID-19 03/17/2022   Hand arthritis 02/27/2022   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   Healthcare maintenance 02/26/2022   Advance care planning 02/26/2022   Barrett's esophagus  02/26/2022   Anxiety 03/26/2020   Primary osteoarthritis of left knee 03/25/2019   Diabetes mellitus without complication ([place]) 23/53/6144   Primary biliary cirrhosis ([place]) 05/02/2013   Allergic rhinitis 11/26/2012   Past Medical History:  Diagnosis Date   Arthritis    Barrett's esophagus    Chicken pox    Complication of anesthesia    Diabetes mellitus without complication ([place])    Diverticulitis    Family history of polyps in the colon    GERD (gastroesophageal reflux disease)    Hay fever    Hepatitis    Hyperlipidemia    Per pt   Incontinent of urine    Migraines    Primary biliary cirrhosis ([place])    suspected (AMA (+) but biopsy negative 2014)   Past Surgical History:  Procedure Laterality Date   BLADDER SUSPENSION     with mesh erosion in vaginal vault.   BREAST BIOPSY Left 2014   BREAST SURGERY Bilateral 1984   reduction mammoplasty   CARPAL TUNNEL RELEASE     CESAREAN SECTION     JOINT REPLACEMENT     REDUCTION MAMMAPLASTY Bilateral    ROTATOR CUFF REPAIR  TONSILLECTOMY     TOTAL KNEE ARTHROPLASTY Left 03/25/2019   Procedure: Left Knee Arthroplasty;  Surgeon: Melrose Nakayama, MD;  Location: [facility];  Service: Orthopedics;  Laterality: Left;   VAGINAL HYSTERECTOMY     Social History   Tobacco Use   Smoking status: Former    Packs/day: 1.00    Years: 25.00    Total pack years: 25.00    Types: Cigarettes    Quit date: 09/01/2006    Years since quitting: 15.5   Smokeless tobacco: Never  Vaping Use   Vaping Use: Never used  Substance Use Topics   Alcohol use: Yes    Comment: occ   Drug use: No   Family History  Problem Relation Age of Onset   Arthritis Mother    Early death Father    Diabetes Father    Alcohol abuse Father    COPD Father    Diabetes Mellitus II Father    Heart attack Father    Mental illness Father    Alcohol abuse Sister    Stroke Maternal Grandmother    Stroke Maternal Grandfather    Diabetes Maternal Grandfather     Diabetes Paternal Grandfather    Depression Daughter    Hypotension Daughter        POTS   Colon cancer Neg Hx    Liver cancer Neg Hx    Stomach cancer Neg Hx    Esophageal cancer Neg Hx    Rectal cancer Neg Hx    Breast cancer Neg Hx    Allergies  Allergen Reactions   Invokana [Canagliflozin]     VAGINAL YEAST INFECTIONS   Statins     LFT elevation   Versed [Midazolam]     Talkative after med use   Zetia [Ezetimibe] Cough   Bupropion Hcl Rash   Current Outpatient Medications on File Prior to Visit  Medication Sig Dispense Refill   ALPRAZolam (XANAX) 0.5 MG tablet Take 0.5 mg by mouth 2 (two) times daily as needed.     azelastine (ASTELIN) 0.1 % nasal spray Place 2 sprays into both nostrils 2 (two) times daily. Use in each nostril as directed     Biotin 1 MG CAPS Take 1 mg by mouth daily.     BREO ELLIPTA 200-25 MCG/INH AEPB 1 puff daily.     Cholecalciferol (VITAMIN D3 PO) Take 2,000 Units by mouth daily at 6 (six) AM.     empagliflozin (JARDIANCE) 25 MG TABS tablet Take 1 tablet (25 mg total) by mouth daily. 90 tablet 3   escitalopram (LEXAPRO) 20 MG tablet Take 20 mg by mouth at bedtime.      estradiol (ESTRACE VAGINAL) 0.1 MG/GM vaginal cream daily at 6 (six) AM.     Evolocumab (REPATHA SURECLICK) 417 MG/ML SOAJ INJECT '140MG'$  UNDER THE SKIN EVERY 14 DAYS 6 mL 3   fluticasone (FLONASE) 50 MCG/ACT nasal spray Place 2 sprays into both nostrils 2 (two) times daily.     ipratropium (ATROVENT) 0.06 % nasal spray      ketorolac (ACULAR) 0.5 % ophthalmic solution Place 1 drop into the right eye 4 (four) times daily. 5 mL 3   levocetirizine (XYZAL) 5 MG tablet Take 5 mg by mouth every evening.     Magnesium 300 MG CAPS Take 1 capsule by mouth daily at 6 (six) AM.     meloxicam (MOBIC) 15 MG tablet Take 0.5-1 tablets (7.5-15 mg total) by mouth daily. With food. 90 tablet 1   metFORMIN (  GLUCOPHAGE) 1000 MG tablet Take 1 tablet (1,000 mg total) by mouth daily with breakfast. 90 tablet 3    montelukast (SINGULAIR) 10 MG tablet Take 10 mg by mouth at bedtime.     pantoprazole (PROTONIX) 40 MG tablet Take 1 tablet (40 mg total) by mouth 2 (two) times daily before a meal. Take 30 minutes before meals 180 tablet 1   prednisoLONE acetate (PRED FORTE) 1 % ophthalmic suspension Place 1 drop into the right eye 4 (four) times daily. 10 mL 0   Semaglutide, 1 MG/DOSE, (OZEMPIC, 1 MG/DOSE,) 4 MG/3ML SOPN '1mg'$      topiramate (TOPAMAX) 100 MG tablet Take 100 mg by mouth at bedtime.      traZODone (DESYREL) 50 MG tablet Take 1 tablet by mouth at bedtime.     ursodiol (ACTIGALL) 300 MG capsule Take 1 capsule (300 mg total) by mouth 2 (two) times daily. 180 capsule 3   No current facility-administered medications on file prior to visit.    Review of Systems  Constitutional:  Positive for appetite change and fatigue. Negative for fever.       Feels feverish    HENT:  Positive for congestion, postnasal drip, rhinorrhea, sinus pressure, sneezing and sore throat. Negative for ear pain.   Eyes:  Negative for pain and discharge.  Respiratory:  Positive for cough. Negative for chest tightness, shortness of breath, wheezing and stridor.   Cardiovascular:  Negative for chest pain.  Gastrointestinal:  Negative for diarrhea, nausea and vomiting.  Genitourinary:  Negative for frequency, hematuria and urgency.  Musculoskeletal:  Negative for arthralgias and myalgias.  Skin:  Negative for rash.  Neurological:  Positive for headaches. Negative for dizziness, weakness and light-headedness.  Psychiatric/Behavioral:  Negative for confusion and dysphoric mood.        Objective:   Physical Exam Constitutional:      General: She is not in acute distress.    Appearance: Normal appearance. She is well-developed. She is obese. She is not ill-appearing, toxic-appearing or diaphoretic.  HENT:     Head: Normocephalic and atraumatic.     Comments: Nares are injected and congested      Right Ear: Tympanic  membrane, ear canal and external ear normal.     Left Ear: Tympanic membrane, ear canal and external ear normal.     Nose: Congestion and rhinorrhea present.     Mouth/Throat:     Mouth: Mucous membranes are moist.     Pharynx: Oropharynx is clear. No oropharyngeal exudate or posterior oropharyngeal erythema.     Comments: Clear pnd  Eyes:     General:        Right eye: No discharge.        Left eye: No discharge.     Conjunctiva/sclera: Conjunctivae normal.     Pupils: Pupils are equal, round, and reactive to light.  Cardiovascular:     Rate and Rhythm: Normal rate and regular rhythm.     Heart sounds: Normal heart sounds.  Pulmonary:     Effort: Pulmonary effort is normal. No respiratory distress.     Breath sounds: Normal breath sounds. No stridor. No wheezing, rhonchi or rales.  Chest:     Chest wall: No tenderness.  Musculoskeletal:     Cervical back: Normal range of motion and neck supple.     Right lower leg: No edema.     Left lower leg: No edema.  Lymphadenopathy:     Cervical: No cervical adenopathy.  Skin:  General: Skin is warm and dry.     Capillary Refill: Capillary refill takes less than 2 seconds.     Findings: No rash.  Neurological:     Mental Status: She is alert.     Cranial Nerves: No cranial nerve deficit.  Psychiatric:        Mood and Affect: Mood normal.           Assessment & Plan:   Problem List Items Addressed This Visit       Other   COVID-19    Antiviral tx discussed given age and risk factors paxlovid px and poss side eff discussed/handout given  ER precautions rev Symptom care recommendations made and handouts given Will watch closely for wheezing/chest tight ness  Pt has a rescue inhaler  Rev isolation and masking protocols Update if not starting to improve in a week or if worsening         Relevant Medications   nirmatrelvir/ritonavir EUA (PAXLOVID) 20 x 150 MG & 10 x '100MG'$  TABS   Other Visit Diagnoses     Acute  cough    -  Primary   Relevant Orders   POCT Influenza A/B (Completed)   POC COVID-19 (Completed)

## 2022-03-17 NOTE — Patient Instructions (Addendum)
Take the paxlovid as directed   .Drink fluids and rest  mucinex DM is good for cough and congestion  Nasal saline for congestion as needed  Tylenol (sparingly)  for fever or pain or headache  Please alert [place] if symptoms worsen (if severe or short of breath please go to the ER)   If you get wheezing or tight chest please let me know-would consider prednisone   If you suddenly worsen or cannot breathe- go to the ER  Isolate until symptoms are better Then mask for 10 more days   Update if not starting to improve in a week or if worsening

## 2022-03-17 NOTE — Assessment & Plan Note (Signed)
Antiviral tx discussed given age and risk factors paxlovid px and poss side eff discussed/handout given  ER precautions rev Symptom care recommendations made and handouts given Will watch closely for wheezing/chest tight ness  Pt has a rescue inhaler  Rev isolation and masking protocols Update if not starting to improve in a week or if worsening

## 2022-03-20 ENCOUNTER — Encounter: Payer: Self-pay | Admitting: Family Medicine

## 2022-03-20 NOTE — Telephone Encounter (Signed)
-----   Message from Abner Greenspan, MD sent at 03/17/2022  1:51 PM EDT ----- Rexene Agent, please check in on her on Monday

## 2022-03-20 NOTE — Telephone Encounter (Signed)
Didn't see mychart so called to check on pt. She is better just had a question about getting 10 days of paxlovid. Pt advised to only take med as prescribed for 5 days. No other issues or concerns

## 2022-03-28 NOTE — Progress Notes (Signed)
[facility] Note  03/29/2022     CHIEF COMPLAINT Patient presents for Retina Follow Up   HISTORY OF PRESENT ILLNESS: Natasha Franco is a 71 y.o. female who presents to the clinic today for:   HPI     Retina Follow Up   Patient presents with  Other.  In right eye.  Severity is moderate.  Duration of 4 weeks.  Since onset it is stable.  I, the attending physician,  performed the HPI with the patient and updated documentation appropriately.        Comments   Pt here for 4 wk ret f/u CME OD. Pt states [facility] the same, no changes.       Last edited by Bernarda Caffey, MD on 03/29/2022  8:29 AM.    Pt states she could tell her vision was not any better when she tried reading the eye chart this morning, she has been using her drops as directed   Referring physician: Luberta Mutter MD [place], [place] [id number]  HISTORICAL INFORMATION:   Selected notes from the MEDICAL RECORD NUMBER Referred by Dr. Ellie Lunch for retinoschisis OD LEE:  Ocular Hx- HH plaque X2 OS (2018), cataracts, progressive retinoschisis OD PMH-    CURRENT MEDICATIONS: Current Outpatient Medications (Ophthalmic Drugs)  Medication Sig   ketorolac (ACULAR) 0.5 % ophthalmic solution Place 1 drop into the right eye 4 (four) times daily.   prednisoLONE acetate (PRED FORTE) 1 % ophthalmic suspension Place 1 drop into the right eye 4 (four) times daily.   No current facility-administered medications for this visit. (Ophthalmic Drugs)   Current Outpatient Medications (Other)  Medication Sig   ALPRAZolam (XANAX) 0.5 MG tablet Take 0.5 mg by mouth 2 (two) times daily as needed.   azelastine (ASTELIN) 0.1 % nasal spray Place 2 sprays into both nostrils 2 (two) times daily. Use in each nostril as directed   Biotin 1 MG CAPS Take 1 mg by mouth daily.   BREO ELLIPTA 200-25 MCG/INH AEPB 1 puff daily.   Cholecalciferol (VITAMIN D3 PO) Take 2,000 Units by mouth daily at 6 (six) AM.    empagliflozin (JARDIANCE) 25 MG TABS tablet Take 1 tablet (25 mg total) by mouth daily.   escitalopram (LEXAPRO) 20 MG tablet Take 20 mg by mouth at bedtime.    estradiol (ESTRACE VAGINAL) 0.1 MG/GM vaginal cream daily at 6 (six) AM.   Evolocumab (REPATHA SURECLICK) 330 MG/ML SOAJ INJECT 140MG UNDER THE SKIN EVERY 14 DAYS   fluticasone (FLONASE) 50 MCG/ACT nasal spray Place 2 sprays into both nostrils 2 (two) times daily.   ipratropium (ATROVENT) 0.06 % nasal spray    levocetirizine (XYZAL) 5 MG tablet Take 5 mg by mouth every evening.   Magnesium 300 MG CAPS Take 1 capsule by mouth daily at 6 (six) AM.   meloxicam (MOBIC) 15 MG tablet Take 0.5-1 tablets (7.5-15 mg total) by mouth daily. With food.   metFORMIN (GLUCOPHAGE) 1000 MG tablet Take 1 tablet (1,000 mg total) by mouth daily with breakfast.   montelukast (SINGULAIR) 10 MG tablet Take 10 mg by mouth at bedtime.   pantoprazole (PROTONIX) 40 MG tablet Take 1 tablet (40 mg total) by mouth 2 (two) times daily before a meal. Take 30 minutes before meals   Semaglutide, 1 MG/DOSE, (OZEMPIC, 1 MG/DOSE,) 4 MG/3ML SOPN 44m   topiramate (TOPAMAX) 100 MG tablet Take 100 mg by mouth at bedtime.    traZODone (DESYREL) 50 MG  tablet Take 1 tablet by mouth at bedtime.   ursodiol (ACTIGALL) 300 MG capsule Take 1 capsule (300 mg total) by mouth 2 (two) times daily.   No current facility-administered medications for this visit. (Other)   REVIEW OF SYSTEMS: ROS   Positive for: Gastrointestinal, Neurological, Musculoskeletal, Endocrine, Eyes Negative for: Constitutional, Skin, Genitourinary, HENT, Cardiovascular, Respiratory, Psychiatric, Allergic/Imm, Heme/Lymph Last edited by Kingsley Spittle, COT on 03/29/2022  7:52 AM.     ALLERGIES Allergies  Allergen Reactions   Invokana [Canagliflozin]     VAGINAL YEAST INFECTIONS   Statins     LFT elevation   Versed [Midazolam]     Talkative after med use   Zetia [Ezetimibe] Cough   Bupropion Hcl  Rash   PAST MEDICAL HISTORY Past Medical History:  Diagnosis Date   Arthritis    Barrett's esophagus    Chicken pox    Complication of anesthesia    Diabetes mellitus without complication ([place])    Diverticulitis    Family history of polyps in the colon    GERD (gastroesophageal reflux disease)    Hay fever    Hepatitis    Hyperlipidemia    Per pt   Incontinent of urine    Migraines    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   BREAST BIOPSY Left 2014   BREAST SURGERY Bilateral 1984   reduction mammoplasty   CARPAL TUNNEL RELEASE     CESAREAN SECTION     JOINT REPLACEMENT     REDUCTION MAMMAPLASTY Bilateral    ROTATOR CUFF REPAIR     TONSILLECTOMY     TOTAL KNEE ARTHROPLASTY Left 03/25/2019   Procedure: Left Knee Arthroplasty;  Surgeon: Melrose Nakayama, MD;  Location: [facility];  Service: Orthopedics;  Laterality: Left;   VAGINAL HYSTERECTOMY     FAMILY HISTORY Family History  Problem Relation Age of Onset   Arthritis Mother    Early death Father    Diabetes Father    Alcohol abuse Father    COPD Father    Diabetes Mellitus II Father    Heart attack Father    Mental illness Father    Alcohol abuse Sister    Stroke Maternal Grandmother    Stroke Maternal Grandfather    Diabetes Maternal Grandfather    Diabetes Paternal Grandfather    Depression Daughter    Hypotension Daughter        POTS   Colon cancer Neg Hx    Liver cancer Neg Hx    Stomach cancer Neg Hx    Esophageal cancer Neg Hx    Rectal cancer Neg Hx    Breast cancer Neg Hx    SOCIAL HISTORY Social History   Tobacco Use   Smoking status: Former    Packs/day: 1.00    Years: 25.00    Total pack years: 25.00    Types: Cigarettes    Quit date: 09/01/2006    Years since quitting: 15.5   Smokeless tobacco: Never  Vaping Use   Vaping Use: Never  used  Substance Use Topics   Alcohol use: Yes    Comment: occ   Drug use: No       OPHTHALMIC EXAM:  Base Eye Exam     Visual Acuity (Snellen - Linear)       Right Left  Dist [place] 20/40 20/20   Dist ph [place] NI          Tonometry (Tonopen, 8:03 AM)       Right Left   Pressure 12 6         Pupils       Dark Light Shape React APD   Right 3 2 Round Minimal None   Left 2 1 Round Brisk None         Visual Fields (Counting fingers)       Left Right    Full Full         Extraocular Movement       Right Left    Full, Ortho Full, Ortho         Neuro/Psych     Oriented x3: Yes   Mood/Affect: Normal         Dilation     Both eyes: 1.0% Mydriacyl, 2.5% Phenylephrine @ 8:03 AM           Slit Lamp and Fundus Exam     Slit Lamp Exam       Right Left   Lids/Lashes Dermatochalasis - upper lid Dermatochalasis - upper lid, mild MGD, Telangiectasia   Conjunctiva/Sclera White and quiet White and quiet   Cornea 2+ fine Punctate epithelial erosions inferiorly, well healed cataract wound 1+ PEE, mild tear film debris, well healed cataract wound   Anterior Chamber deep and clear, no cell or flare deep and clear, no cell or flare   Iris Round and dilated Round and dilated   Lens PC IOL in good position PC IOL in good position   Anterior Vitreous Vitreous syneresis, +fine pigment, Posterior vitreous detachment, pigmented debris settled inferiory, vitreous condensations Vitreous syneresis, no pigment, Posterior vitreous detachment, vitreous condensations         Fundus Exam       Right Left   Disc Pink and Sharp, mild tilt, inferior PPA Pink and Sharp, tilted, inferior PPA   C/D Ratio 0.5 0.5   Macula Flat, Blunted foveal reflex, interval improvement in central cystic changes, mild RPE mottling greatest centrally, No heme or edema Flat, Blunted foveal reflex, mild RPE mottling, No heme or edema   Vessels attenuated, mild copper wiring, mild AV crossing  changes attenuated, mild tortuosity   Periphery Attached, inferior schisis from 0400-0730 with pigment clumping and pigment atrophy within schisis cavity, outer retinal hole at 0700 -- good barricade laser changes posterior to schisis; no new RT/RD or schisis Attached, very shallow schisis IT periphery           Refraction     Manifest Refraction       Sphere Cylinder Axis Dist [facility]   Right +0.25 +0.25 165 20/40+2   Left               IMAGING AND PROCEDURES  Imaging and Procedures for 03/29/2022  OCT, Retina - OU - Both Eyes       Right Eye Quality was good. Central Foveal Thickness: 306. Progression has improved. Findings include normal foveal contour, no IRF, no SRF, abnormal foveal contour (interval improvement in central CME -- just trace cystic changes remain IN fovea, mild decrease in central ellipsoid signal, IT schisis with outer retinal holes caught on widefield -- not imaged today).   Left Eye Quality was good. Central Foveal Thickness: 286. Progression has been stable. Findings include normal foveal contour, no IRF, no SRF, vitreomacular adhesion (Shallow schisis IT periphery caught on widefield -  not imaged today).   Notes *Images captured and stored on drive  Diagnosis / Impression:  OD: interval improvement in central CME -- just trace cystic changes remain IN fovea, mild decrease in central ellipsoid signal, IT schisis with outer retinal holes caught on widefield -- not imaged today OS: NFP; no IRF/SRF; Shallow schisis IT periphery caught on widefield -- not imaged today  Clinical management:  See below  Abbreviations: NFP - Normal foveal profile. CME - cystoid macular edema. PED - pigment epithelial detachment. IRF - intraretinal fluid. SRF - subretinal fluid. EZ - ellipsoid zone. ERM - epiretinal membrane. ORA - outer retinal atrophy. ORT - outer retinal tubulation. SRHM - subretinal hyper-reflective material. IRHM - intraretinal hyper-reflective  material            ASSESSMENT/PLAN:    ICD-10-CM   1. 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 Diabetes mellitus type 2 without retinopathy ([place])  E11.9     5. Pseudophakia, both eyes  Z96.1     6. Combined forms of age-related cataract of both eyes  H25.813       1. CME OD - development of IRF and sliver of SRF centrally -- consistent with CME, noted on 07.31.23 visit - suspect delayed [place], s/p CEIOL with Dr. Ellie Lunch in March - started on PF and Ketorolac QID OD on 07.31.23 - OCT shows OD: interval improvement in central CME -- just trace cystic changes remain IN fovea, mild decrease in central ellipsoid signal, IT schisis with outer retinal holes caught on widefield -- not imaged today - BCVA decreased to 20/40 from 20/30 OD - exam shows significant corneal dryness (2+ PEE) - discussed findings - recommend slow taper of PF and Ketorolac (4x/day for 2 weeks, 3x/day for 2 weeks, 2x day for 2 weeks, 1x day for 2 weeks) - recommend starting artificial tears QID OD  - f/u 8 weeks, DFE, OCT  2,3. Retinoschisis OU - OD: inferior schisis 0400-0730 with pigment clumping and pigment atrophy within schisis cavity, outer retinal hole at 0700 - OS: very shallow schisis IT periphery - pt reports intermittent photopsias and floaters OU -- likely unrelated to schisis - s/p laser retinopexy OD (01.30.23) -- good barricade laser changes posterior to schisis - f/u 3-4 months, DFE, OCT  4. Diabetes mellitus, type 2 without retinopathy - The incidence, risk factors for progression, natural history and treatment options for diabetic retinopathy  were discussed with patient.   - The need for close monitoring of blood glucose, blood pressure, and serum lipids, avoiding cigarette or any type of tobacco, and the need for long term follow up was also discussed with  patient. - f/u in 1 year, sooner prn  5. Pseudophakia OU  - s/p CE/IOL OU (Dr. Ellie Lunch, March / April 2023)  - IOLs in perfect position, doing well  - mild CME OD as above  - monitor  6. Dry eyes OU (OD > OS) - recommend artificial tears and lubricating ointment as needed   Ophthalmic Meds Ordered this visit:  No orders of the defined types were placed in this encounter.    Return in about 8 weeks (around 05/24/2022) for f/u CME OD, DFE, OCT.  There are no Patient Instructions on file for this visit.   Explained the diagnoses, plan, and follow up  with the patient and they expressed understanding.  Patient expressed understanding of the importance of proper follow up care.   This document serves as a record of services personally performed by Gardiner Sleeper, MD, PhD. It was created on their behalf by San Jetty. Owens Shark, OA an ophthalmic technician. The creation of this record is the provider's dictation and/or activities during the visit.    Electronically signed by: San Jetty. Owens Shark, [place] 09.19.2023 8:29 AM  Gardiner Sleeper, M.D., Ph.D. Diseases & Surgery of the Retina and Vitreous [facility]  I have reviewed the above documentation for accuracy and completeness, and I agree with the above. Gardiner Sleeper, M.D., Ph.D. 03/29/22 8:32 AM  Abbreviations: M myopia (nearsighted); A astigmatism; H hyperopia (farsighted); P presbyopia; Mrx spectacle prescription;  CTL contact lenses; OD right eye; OS left eye; OU both eyes  XT exotropia; ET esotropia; PEK punctate epithelial keratitis; PEE punctate epithelial erosions; DES dry eye syndrome; MGD meibomian gland dysfunction; ATs artificial tears; PFAT's preservative free artificial tears; [place] nuclear sclerotic cataract; PSC posterior subcapsular cataract; ERM epi-retinal membrane; PVD posterior vitreous detachment; RD retinal detachment; DM diabetes mellitus; DR diabetic retinopathy; NPDR non-proliferative diabetic  retinopathy; PDR proliferative diabetic retinopathy; CSME clinically significant macular edema; DME diabetic macular edema; dbh dot blot hemorrhages; CWS cotton wool spot; POAG primary open angle glaucoma; C/D cup-to-disc ratio; HVF humphrey visual field; GVF goldmann visual field; OCT optical coherence tomography; IOP intraocular pressure; BRVO Branch retinal vein occlusion; CRVO central retinal vein occlusion; CRAO central retinal artery occlusion; BRAO branch retinal artery occlusion; RT retinal tear; SB scleral buckle; PPV pars plana vitrectomy; VH Vitreous hemorrhage; PRP panretinal laser photocoagulation; IVK intravitreal kenalog; VMT vitreomacular traction; MH Macular hole;  NVD neovascularization of the disc; NVE neovascularization elsewhere; AREDS age related eye disease study; ARMD age related macular degeneration; POAG primary open angle glaucoma; EBMD epithelial/anterior basement membrane dystrophy; ACIOL anterior chamber intraocular lens; IOL intraocular lens; PCIOL posterior chamber intraocular lens; Phaco/IOL phacoemulsification with intraocular lens placement; [place] photorefractive keratectomy; LASIK laser assisted in situ keratomileusis; HTN hypertension; DM diabetes mellitus; COPD chronic obstructive pulmonary disease

## 2022-03-29 ENCOUNTER — Ambulatory Visit (INDEPENDENT_AMBULATORY_CARE_PROVIDER_SITE_OTHER): Payer: Medicare PPO | Admitting: Ophthalmology

## 2022-03-29 ENCOUNTER — Encounter (INDEPENDENT_AMBULATORY_CARE_PROVIDER_SITE_OTHER): Payer: Self-pay | Admitting: Ophthalmology

## 2022-03-29 DIAGNOSIS — H35351 Cystoid macular degeneration, right eye: Secondary | ICD-10-CM

## 2022-03-29 DIAGNOSIS — H33321 Round hole, right eye: Secondary | ICD-10-CM

## 2022-03-29 DIAGNOSIS — H33103 Unspecified retinoschisis, bilateral: Secondary | ICD-10-CM

## 2022-03-29 DIAGNOSIS — Z961 Presence of intraocular lens: Secondary | ICD-10-CM

## 2022-03-29 DIAGNOSIS — E119 Type 2 diabetes mellitus without complications: Secondary | ICD-10-CM

## 2022-03-29 DIAGNOSIS — H25813 Combined forms of age-related cataract, bilateral: Secondary | ICD-10-CM

## 2022-04-03 ENCOUNTER — Encounter: Payer: Self-pay | Admitting: Family Medicine

## 2022-04-03 ENCOUNTER — Telehealth: Payer: Self-pay

## 2022-04-03 DIAGNOSIS — E782 Mixed hyperlipidemia: Secondary | ICD-10-CM

## 2022-04-03 NOTE — Telephone Encounter (Signed)
Received fax from pharmacy. Patient is changing to [facility]. They request new script be sent. Do not see where has been prescribed by you in the past. Ok to send in?

## 2022-04-04 ENCOUNTER — Encounter: Payer: Self-pay | Admitting: Interventional Cardiology

## 2022-04-04 ENCOUNTER — Other Ambulatory Visit: Payer: Self-pay | Admitting: Family Medicine

## 2022-04-04 DIAGNOSIS — E782 Mixed hyperlipidemia: Secondary | ICD-10-CM

## 2022-04-04 MED ORDER — METFORMIN HCL 1000 MG PO TABS: 1000.0000 mg | ORAL_TABLET | Freq: Every day | ORAL | 3 refills | Status: DC

## 2022-04-04 MED ORDER — ALPRAZOLAM 0.5 MG PO TABS: 0.5000 mg | ORAL_TABLET | Freq: Two times a day (BID) | ORAL | 0 refills | Status: DC | PRN

## 2022-04-04 MED ORDER — REPATHA SURECLICK 140 MG/ML ~~LOC~~ SOAJ: SUBCUTANEOUS | 3 refills | Status: DC

## 2022-04-04 NOTE — Telephone Encounter (Signed)
See mychart message sent. I was out of the office yesterday and paper copy is already gone.

## 2022-04-04 NOTE — Telephone Encounter (Signed)
Which med?  Please let me know.  Thanks.

## 2022-04-04 NOTE — Telephone Encounter (Signed)
I sent in refill for metformin but Repatha was sent in by Dr. Daneen Schick; are you okay filling or does that need to come from him? Patient also wanted refill on xanax LOV 03/17/22 w/ tower, NOV 05/26/22 and was entered in system historical 05/26/19.

## 2022-04-04 NOTE — Telephone Encounter (Signed)
Patient has been notified via my chart.

## 2022-04-04 NOTE — Telephone Encounter (Signed)
I sent the prescription for Xanax.  I think it makes sense for her Repatha prescription to come through cardiology.  Please route that to cardiology or have the patient check with them, if that has not already been done.  Thanks.

## 2022-04-06 ENCOUNTER — Encounter: Payer: Self-pay | Admitting: Family Medicine

## 2022-04-07 ENCOUNTER — Other Ambulatory Visit: Payer: Self-pay | Admitting: Family Medicine

## 2022-04-07 MED ORDER — ESCITALOPRAM OXALATE 20 MG PO TABS: 20.0000 mg | ORAL_TABLET | Freq: Every day | ORAL | 2 refills | Status: DC

## 2022-04-07 NOTE — Telephone Encounter (Signed)
Lexapro was put in as historical; okay to fill?

## 2022-04-18 ENCOUNTER — Encounter: Payer: Self-pay | Admitting: Family Medicine

## 2022-04-18 NOTE — Telephone Encounter (Signed)
Ozempic was entered as historical; are you okay filling?

## 2022-04-19 ENCOUNTER — Other Ambulatory Visit: Payer: Self-pay | Admitting: Family Medicine

## 2022-04-19 MED ORDER — OZEMPIC (1 MG/DOSE) 4 MG/3ML ~~LOC~~ SOPN
PEN_INJECTOR | SUBCUTANEOUS | 3 refills | Status: DC
Start: 2022-04-19 — End: 2022-04-19

## 2022-04-19 MED ORDER — SEMAGLUTIDE (1 MG/DOSE) 4 MG/3ML ~~LOC~~ SOPN: 1.0000 mg | PEN_INJECTOR | SUBCUTANEOUS | 3 refills | Status: DC

## 2022-05-17 NOTE — Progress Notes (Addendum)
[facility] Note  05/24/2022     CHIEF COMPLAINT Patient presents for Retina Follow Up   HISTORY OF PRESENT ILLNESS: Natasha Franco is a 71 y.o. female who presents to the clinic today for:   HPI     Retina Follow Up   Patient presents with  Other.  In right eye.  This started 8.  Severity is moderate.  Duration of 8 weeks.  Since onset it is stable.  I, the attending physician,  performed the HPI with the patient and updated documentation appropriately.        Comments   8 week Retina follow up CME OD pt states no vision changes noticed       Last edited by Bernarda Caffey, MD on 05/24/2022  8:40 AM.    Pt states she felt like her vision is better, she says today is her last day of 1 drop of PF and Ketorolac   Referring physician: Luberta Mutter MD [facility], [place] [address]  HISTORICAL INFORMATION:   Selected notes from the MEDICAL RECORD NUMBER Referred by Dr. Ellie Lunch for retinoschisis OD LEE:  Ocular Hx- HH plaque X2 OS (2018), cataracts, progressive retinoschisis OD PMH-    CURRENT MEDICATIONS: Current Outpatient Medications (Ophthalmic Drugs)  Medication Sig   ketorolac (ACULAR) 0.5 % ophthalmic solution Place 1 drop into the right eye 4 (four) times daily.   prednisoLONE acetate (PRED FORTE) 1 % ophthalmic suspension Place 1 drop into the right eye 4 (four) times daily.   No current facility-administered medications for this visit. (Ophthalmic Drugs)   Current Outpatient Medications (Other)  Medication Sig   ALPRAZolam (XANAX) 0.5 MG tablet Take 1 tablet (0.5 mg total) by mouth 2 (two) times daily as needed.   azelastine (ASTELIN) 0.1 % nasal spray Place 2 sprays into both nostrils 2 (two) times daily. Use in each nostril as directed   Biotin 1 MG CAPS Take 1 mg by mouth daily.   BREO ELLIPTA 200-25 MCG/INH AEPB 1 puff daily. (Patient not taking: Reported on 05/23/2022)   Cholecalciferol (VITAMIN D3 PO) Take  2,000 Units by mouth daily at 6 (six) AM.   empagliflozin (JARDIANCE) 25 MG TABS tablet Take 1 tablet (25 mg total) by mouth daily.   escitalopram (LEXAPRO) 20 MG tablet Take 1 tablet (20 mg total) by mouth at bedtime.   estradiol (ESTRACE VAGINAL) 0.1 MG/GM vaginal cream daily at 6 (six) AM.   Evolocumab (REPATHA SURECLICK) 884 MG/ML SOAJ INJECT '140MG'$  UNDER THE SKIN EVERY 14 DAYS   fluticasone (FLONASE) 50 MCG/ACT nasal spray Place 2 sprays into both nostrils 2 (two) times daily.   fluticasone-salmeterol (WIXELA INHUB) 250-50 MCG/ACT AEPB Inhale 1 puff into the lungs in the morning and at bedtime.   ipratropium (ATROVENT) 0.06 % nasal spray    levocetirizine (XYZAL) 5 MG tablet Take 5 mg by mouth every evening.   Magnesium 300 MG CAPS Take 1 capsule by mouth daily at 6 (six) AM.   meloxicam (MOBIC) 15 MG tablet Take 0.5-1 tablets (7.5-15 mg total) by mouth daily. With food. (Patient not taking: Reported on 05/23/2022)   metFORMIN (GLUCOPHAGE) 1000 MG tablet Take 1 tablet (1,000 mg total) by mouth daily with breakfast.   montelukast (SINGULAIR) 10 MG tablet Take 10 mg by mouth at bedtime.   pantoprazole (PROTONIX) 40 MG tablet Take 1 tablet (40 mg total) by mouth 2 (two) times daily before a meal. Take 30 minutes before meals  Semaglutide, 1 MG/DOSE, 4 MG/3ML SOPN Inject 1 mg as directed once a week.   topiramate (TOPAMAX) 100 MG tablet Take 100 mg by mouth at bedtime.    traZODone (DESYREL) 50 MG tablet Take 1 tablet by mouth at bedtime.   ursodiol (ACTIGALL) 300 MG capsule Take 1 capsule (300 mg total) by mouth 2 (two) times daily.   No current facility-administered medications for this visit. (Other)   REVIEW OF SYSTEMS:   ALLERGIES Allergies  Allergen Reactions   Invokana [Canagliflozin]     VAGINAL YEAST INFECTIONS   Statins     LFT elevation   Versed [Midazolam]     Talkative after med use   Zetia [Ezetimibe] Cough   Bupropion Hcl Rash   PAST MEDICAL HISTORY Past Medical  History:  Diagnosis Date   Arthritis    Barrett's esophagus    Chicken pox    Complication of anesthesia    Diabetes mellitus without complication ([place])    Diverticulitis    Family history of polyps in the colon    GERD (gastroesophageal reflux disease)    Hay fever    Hepatitis    Hyperlipidemia    Per pt   Incontinent of urine    Migraines    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   BREAST BIOPSY Left 2014   BREAST SURGERY Bilateral 1984   reduction mammoplasty   CARPAL TUNNEL RELEASE     CESAREAN SECTION     JOINT REPLACEMENT     REDUCTION MAMMAPLASTY Bilateral    ROTATOR CUFF REPAIR     TONSILLECTOMY     TOTAL KNEE ARTHROPLASTY Left 03/25/2019   Procedure: Left Knee Arthroplasty;  Surgeon: Melrose Nakayama, MD;  Location: [facility];  Service: Orthopedics;  Laterality: Left;   VAGINAL HYSTERECTOMY     FAMILY HISTORY Family History  Problem Relation Age of Onset   Arthritis Mother    Early death Father    Diabetes Father    Alcohol abuse Father    COPD Father    Diabetes Mellitus II Father    Heart attack Father    Mental illness Father    Alcohol abuse Sister    Stroke Maternal Grandmother    Stroke Maternal Grandfather    Diabetes Maternal Grandfather    Diabetes Paternal Grandfather    Depression Daughter    Hypotension Daughter        POTS   Colon cancer Neg Hx    Liver cancer Neg Hx    Stomach cancer Neg Hx    Esophageal cancer Neg Hx    Rectal cancer Neg Hx    Breast cancer Neg Hx    SOCIAL HISTORY Social History   Tobacco Use   Smoking status: Former    Packs/day: 1.00    Years: 25.00    Total pack years: 25.00    Types: Cigarettes    Quit date: 09/01/2006    Years since quitting: 15.7   Smokeless tobacco: Never  Vaping Use   Vaping Use: Never used  Substance Use Topics   Alcohol use:  Yes    Comment: occ   Drug use: No       OPHTHALMIC EXAM:  Base Eye Exam     Visual Acuity (Snellen - Linear)       Right Left  Dist  20/30 -1 20/20         Tonometry (Tonopen, 8:03 AM)       Right Left   Pressure 15 12         Pupils       Pupils Dark Light Shape React APD   Right PERRL 3 2 Round Minimal None   Left PERRL 3 2 Round Minimal None         Visual Fields       Left Right    Full Full         Extraocular Movement       Right Left    Full, Ortho Full, Ortho         Neuro/Psych     Oriented x3: Yes   Mood/Affect: Normal         Dilation     Both eyes: 2.5% Phenylephrine @ 8:03 AM           Slit Lamp and Fundus Exam     Slit Lamp Exam       Right Left   Lids/Lashes Dermatochalasis - upper lid Dermatochalasis - upper lid, mild MGD, Telangiectasia   Conjunctiva/Sclera White and quiet White and quiet   Cornea 1+ Punctate epithelial erosions inferiorly, well healed cataract wound 1+ PEE, mild tear film debris, well healed cataract wound   Anterior Chamber deep and clear, no cell or flare deep and clear, no cell or flare   Iris Round and dilated Round and dilated   Lens PC IOL in good position PC IOL in good position   Anterior Vitreous Vitreous syneresis, +fine pigment, Posterior vitreous detachment, pigmented debris settled inferiory, vitreous condensations Vitreous syneresis, no pigment, Posterior vitreous detachment, vitreous condensations         Fundus Exam       Right Left   Disc Pink and Sharp, mild tilt, inferior PPA Pink and Sharp, tilted, inferior PPA   C/D Ratio 0.5 0.5   Macula Flat, Blunted foveal reflex, interval increase in central cystic changes, mild RPE mottling greatest centrally, No heme or edema Flat, Blunted foveal reflex, mild RPE mottling, No heme or edema   Vessels mild attenuation, mild tortuosity, mild AV crossing changes attenuated, mild tortuosity   Periphery Attached, inferior schisis from  0400-0730 with pigment clumping and pigment atrophy within schisis cavity, outer retinal hole at 0700 -- good barricade laser changes posterior to schisis; no new RT/RD/lattice or schisis Attached, very shallow schisis IT periphery           IMAGING AND PROCEDURES  Imaging and Procedures for 05/24/2022  OCT, Retina - OU - Both Eyes       Right Eye Quality was good. Central Foveal Thickness: 364. Progression has worsened. Findings include normal foveal contour, no IRF, no SRF, abnormal foveal contour (Interval increase in IRF / central CME, mild decrease in central ellipsoid signal, IT schisis with outer retinal holes caught on widefield -- not imaged today).   Left Eye Quality was good. Central Foveal Thickness: 286. Progression has been stable. Findings include normal foveal contour, no IRF, no SRF, vitreomacular adhesion (Shallow schisis IT periphery caught on widefield - not imaged today).   Notes *Images captured and stored on drive  Diagnosis / Impression:  OD: Interval increase in IRF / central CME, mild decrease in central ellipsoid signal, IT schisis with outer retinal holes caught on widefield -- not imaged today OS: NFP; no IRF/SRF; Shallow schisis IT periphery caught on widefield --  not imaged today  Clinical management:  See below  Abbreviations: NFP - Normal foveal profile. CME - cystoid macular edema. PED - pigment epithelial detachment. IRF - intraretinal fluid. SRF - subretinal fluid. EZ - ellipsoid zone. ERM - epiretinal membrane. ORA - outer retinal atrophy. ORT - outer retinal tubulation. SRHM - subretinal hyper-reflective material. IRHM - intraretinal hyper-reflective material      Injection into Tenon's Capsule - OD - Right Eye       Time Out 05/24/2022. 8:25 AM. Confirmed correct patient, procedure, site, and patient consented.   Anesthesia Topical anesthesia was used. Anesthetic medications included Lidocaine 2%, Proparacaine 0.5%.    Procedure Preparation included 5% betadine to ocular surface, eyelid speculum.   Injection: 4 mg triamcinolone acetonide 40 MG/ML   Route: Intravitreal, Site: Right Eye   NDC: [phone], Lot: [id number], Expiration date: 06/09/2023   Notes 1.0 cc of Kenalog-40 (40 mg) injected into subtenon's capsule in the superotemporal quadrant. Betadine was applied to Injection area pre and post-injection then rinsed with sterile BSS. 1 drop of polymixin was instilled into the eye. There were no complications. Pt tolerated procedure well.            ASSESSMENT/PLAN:    ICD-10-CM   1. 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 Diabetes mellitus type 2 without retinopathy ([place])  E11.9     5. Pseudophakia, both eyes  Z96.1     6. Combined forms of age-related cataract of both eyes  H25.813      1. CME OD - development of IRF and sliver of SRF centrally -- consistent with CME, noted on 07.31.23 visit - suspect delayed [place], s/p CEIOL with Dr. Ellie Lunch in March - started on PF and Ketorolac QID OD on 07.31.23 and tapered off since last visit (08.22.23) - OCT shows Interval increase in IRF / central CME, mild decrease in central ellipsoid signal, IT schisis with outer retinal holes caught on widefield -- not imaged today - BCVA improved to 20/30 from 20/40 OD - exam shows significant corneal dryness (2+ PEE) - discussed findings  - re-start PF and Ketorolac QID OD - recommend STK OD #1 today, 11.15.23 - recommend starting artificial tears QID OD - pt wishes to proceed with STK OD today - RBA of procedure discussed, questions answered - STK informed consent obtained and signed, 11.15.23 (OD) - see procedure note  - f/u 6-8 weeks, DFE, OCT  2,3. Retinoschisis  OU - OD: inferior schisis 0400-0730 with pigment clumping and pigment atrophy within schisis cavity, outer retinal hole at 0700 - OS: very shallow schisis IT arcades  - pt reports intermittent photopsias and floaters OU -- likely unrelated to schisis - s/p laser retinopexy OD (01.30.23) -- good barricade laser changes posterior to schisis - f/u 3-4 months, DFE, OCT  4. Diabetes mellitus, type 2 without retinopathy - The incidence, risk factors for progression, natural history and treatment options for diabetic retinopathy  were discussed with patient.   - The need for close monitoring of blood glucose, blood pressure, and serum lipids, avoiding cigarette or any type of tobacco, and the need for long term follow up was also discussed with patient. - f/u in  1 year, sooner prn  5. Pseudophakia OU  - s/p CE/IOL OU (Dr. Ellie Lunch, March / April 2023)  - IOLs in perfect position, doing well  - mild CME OD as above  - monitor  6. Dry eyes OU (OD > OS) - recommend artificial tears and lubricating ointment as needed   Ophthalmic Meds Ordered this visit:  Meds ordered this encounter  Medications   ketorolac (ACULAR) 0.5 % ophthalmic solution    Sig: Place 1 drop into the right eye 4 (four) times daily.    Dispense:  10 mL    Refill:  3   prednisoLONE acetate (PRED FORTE) 1 % ophthalmic suspension    Sig: Place 1 drop into the right eye 4 (four) times daily.    Dispense:  15 mL    Refill:  0   triamcinolone acetonide (KENALOG-40) injection 4 mg     Return in about 6 weeks (around 07/05/2022) for f/u CME OD, DFE, OCT.  There are no Patient Instructions on file for this visit.   Explained the diagnoses, plan, and follow up with the patient and they expressed understanding.  Patient expressed understanding of the importance of proper follow up care.   This document serves as a record of services personally performed by Gardiner Sleeper, MD, PhD. It was created on their behalf by Orvan Falconer, an ophthalmic technician. The creation of this record is the provider's dictation and/or activities during the visit.    Electronically signed by: Orvan Falconer, OA, 05/24/22  1:01 PM  This document serves as a record of services personally performed by Gardiner Sleeper, MD, PhD. It was created on their behalf by San Jetty. Owens Shark, OA an ophthalmic technician. The creation of this record is the provider's dictation and/or activities during the visit.    Electronically signed by: San Jetty. [place], [place] 11.15.2023 1:01 PM  Gardiner Sleeper, M.D., Ph.D. Diseases & Surgery of the Retina and Vitreous [facility]  I have reviewed the above documentation for accuracy and completeness, and I agree with the above. Gardiner Sleeper, M.D., Ph.D. 05/24/22 1:01 PM  Abbreviations: M myopia (nearsighted); A astigmatism; H hyperopia (farsighted); P presbyopia; Mrx spectacle prescription;  CTL contact lenses; OD right eye; OS left eye; OU both eyes  XT exotropia; ET esotropia; PEK punctate epithelial keratitis; PEE punctate epithelial erosions; DES dry eye syndrome; MGD meibomian gland dysfunction; ATs artificial tears; PFAT's preservative free artificial tears; [place] nuclear sclerotic cataract; PSC posterior subcapsular cataract; ERM epi-retinal membrane; PVD posterior vitreous detachment; RD retinal detachment; DM diabetes mellitus; DR diabetic retinopathy; NPDR non-proliferative diabetic retinopathy; PDR proliferative diabetic retinopathy; CSME clinically significant macular edema; DME diabetic macular edema; dbh dot blot hemorrhages; CWS cotton wool spot; POAG primary open angle glaucoma; C/D cup-to-disc ratio; HVF humphrey visual field; GVF goldmann visual field; OCT optical coherence tomography; IOP intraocular pressure; BRVO Branch retinal vein occlusion; CRVO central retinal vein occlusion; CRAO central retinal artery occlusion; BRAO branch retinal artery occlusion; RT retinal tear; SB  scleral buckle; PPV pars plana vitrectomy; VH Vitreous hemorrhage; PRP panretinal laser photocoagulation; IVK intravitreal kenalog; VMT vitreomacular traction; MH Macular hole;  NVD neovascularization of the disc; NVE neovascularization elsewhere; AREDS age related eye disease study; ARMD age related macular degeneration; POAG primary open angle glaucoma; EBMD epithelial/anterior basement membrane dystrophy; ACIOL anterior chamber intraocular lens; IOL intraocular lens; PCIOL posterior chamber intraocular lens; Phaco/IOL phacoemulsification with intraocular lens placement; PRK photorefractive keratectomy; LASIK laser assisted in situ  keratomileusis; HTN hypertension; DM diabetes mellitus; COPD chronic obstructive pulmonary disease

## 2022-05-23 ENCOUNTER — Other Ambulatory Visit: Payer: Self-pay | Admitting: Family Medicine

## 2022-05-23 ENCOUNTER — Ambulatory Visit (INDEPENDENT_AMBULATORY_CARE_PROVIDER_SITE_OTHER): Payer: Medicare PPO

## 2022-05-23 VITALS — Ht 61.5 in | Wt 175.0 lb

## 2022-05-23 DIAGNOSIS — Z0000 Encounter for general adult medical examination without abnormal findings: Secondary | ICD-10-CM | POA: Diagnosis not present

## 2022-05-23 DIAGNOSIS — Z1231 Encounter for screening mammogram for malignant neoplasm of breast: Secondary | ICD-10-CM

## 2022-05-23 NOTE — Patient Instructions (Signed)
Natasha Franco , Thank you for taking time to come for your Medicare Wellness Visit. I appreciate your ongoing commitment to your health goals. Please review the following plan we discussed and let me know if I can assist you in the future.   Screening recommendations/referrals: Colonoscopy: not required Mammogram: patient to schedule Bone Density: completed 09/18/2019 Recommended yearly ophthalmology/optometry visit for glaucoma screening and checkup Recommended yearly dental visit for hygiene and checkup  Vaccinations: Influenza vaccine: completed 04/18/2022 Pneumococcal vaccine: due Tdap vaccine: completed 04/18/2022, due 04/18/2032 Shingles vaccine: needs second dose   Covid-19: 05/06/2021, 05/05/2020, 08/22/2019, 07/24/2019  Advanced directives: Please bring a copy of your POA (Power of Attorney) and/or Living Will to your next appointment.   Conditions/risks identified: none  Next appointment: Follow up in one year for your annual wellness visit    Preventive Care 65 Years and Older, Female Preventive care refers to lifestyle choices and visits with your health care provider that can promote health and wellness. What does preventive care include? A yearly physical exam. This is also called an annual well check. Dental exams once or twice a year. Routine eye exams. Ask your health care provider how often you should have your eyes checked. Personal lifestyle choices, including: Daily care of your teeth and gums. Regular physical activity. Eating a healthy diet. Avoiding tobacco and drug use. Limiting alcohol use. Practicing safe sex. Taking low-dose aspirin every day. Taking vitamin and mineral supplements as recommended by your health care provider. What happens during an annual well check? The services and screenings done by your health care provider during your annual well check will depend on your age, overall health, lifestyle risk factors, and family history of  disease. Counseling  Your health care provider may ask you questions about your: Alcohol use. Tobacco use. Drug use. Emotional well-being. Home and relationship well-being. Sexual activity. Eating habits. History of falls. Memory and ability to understand (cognition). Work and work Statistician. Reproductive health. Screening  You may have the following tests or measurements: Height, weight, and BMI. Blood pressure. Lipid and cholesterol levels. These may be checked every 5 years, or more frequently if you are over 70 years old. Skin check. Lung cancer screening. You may have this screening every year starting at age 70 if you have a 30-pack-year history of smoking and currently smoke or have quit within the past 15 years. Fecal occult blood test (FOBT) of the stool. You may have this test every year starting at age 70. Flexible sigmoidoscopy or colonoscopy. You may have a sigmoidoscopy every 5 years or a colonoscopy every 10 years starting at age 70. Hepatitis C blood test. Hepatitis B blood test. Sexually transmitted disease (STD) testing. Diabetes screening. This is done by checking your blood sugar (glucose) after you have not eaten for a while (fasting). You may have this done every 1-3 years. Bone density scan. This is done to screen for osteoporosis. You may have this done starting at age 70. Mammogram. This may be done every 1-2 years. Talk to your health care provider about how often you should have regular mammograms. Talk with your health care provider about your test results, treatment options, and if necessary, the need for more tests. Vaccines  Your health care provider may recommend certain vaccines, such as: Influenza vaccine. This is recommended every year. Tetanus, diphtheria, and acellular pertussis (Tdap, Td) vaccine. You may need a Td booster every 10 years. Zoster vaccine. You may need this after age 61. Pneumococcal 13-valent conjugate (PCV13)  vaccine. One  dose is recommended after age 70. Pneumococcal polysaccharide (PPSV23) vaccine. One dose is recommended after age 70. Talk to your health care provider about which screenings and vaccines you need and how often you need them. This information is not intended to replace advice given to you by your health care provider. Make sure you discuss any questions you have with your health care provider. Document Released: 07/23/2015 Document Revised: 03/15/2016 Document Reviewed: 04/27/2015 Elsevier Interactive Patient Education  2017 [place] Prevention in the Home Falls can cause injuries. They can happen to people of all ages. There are many things you can do to make your home safe and to help prevent falls. What can I do on the outside of my home? Regularly fix the edges of walkways and driveways and fix any cracks. Remove anything that might make you trip as you walk through a door, such as a raised step or threshold. Trim any bushes or trees on the path to your home. Use bright outdoor lighting. Clear any walking paths of anything that might make someone trip, such as rocks or tools. Regularly check to see if handrails are loose or broken. Make sure that both sides of any steps have handrails. Any raised decks and porches should have guardrails on the edges. Have any leaves, snow, or ice cleared regularly. Use sand or salt on walking paths during winter. Clean up any spills in your garage right away. This includes oil or grease spills. What can I do in the bathroom? Use night lights. Install grab bars by the toilet and in the tub and shower. Do not use towel bars as grab bars. Use non-skid mats or decals in the tub or shower. If you need to sit down in the shower, use a plastic, non-slip stool. Keep the floor dry. Clean up any water that spills on the floor as soon as it happens. Remove soap buildup in the tub or shower regularly. Attach bath mats securely with double-sided  non-slip rug tape. Do not have throw rugs and other things on the floor that can make you trip. What can I do in the bedroom? Use night lights. Make sure that you have a light by your bed that is easy to reach. Do not use any sheets or blankets that are too big for your bed. They should not hang down onto the floor. Have a firm chair that has side arms. You can use this for support while you get dressed. Do not have throw rugs and other things on the floor that can make you trip. What can I do in the kitchen? Clean up any spills right away. Avoid walking on wet floors. Keep items that you use a lot in easy-to-reach places. If you need to reach something above you, use a strong step stool that has a grab bar. Keep electrical cords out of the way. Do not use floor polish or wax that makes floors slippery. If you must use wax, use non-skid floor wax. Do not have throw rugs and other things on the floor that can make you trip. What can I do with my stairs? Do not leave any items on the stairs. Make sure that there are handrails on both sides of the stairs and use them. Fix handrails that are broken or loose. Make sure that handrails are as long as the stairways. Check any carpeting to make sure that it is firmly attached to the stairs. Fix any carpet that is loose  or worn. Avoid having throw rugs at the top or bottom of the stairs. If you do have throw rugs, attach them to the floor with carpet tape. Make sure that you have a light switch at the top of the stairs and the bottom of the stairs. If you do not have them, ask someone to add them for you. What else can I do to help prevent falls? Wear shoes that: Do not have high heels. Have rubber bottoms. Are comfortable and fit you well. Are closed at the toe. Do not wear sandals. If you use a stepladder: Make sure that it is fully opened. Do not climb a closed stepladder. Make sure that both sides of the stepladder are locked into place. Ask  someone to hold it for you, if possible. Clearly mark and make sure that you can see: Any grab bars or handrails. First and last steps. Where the edge of each step is. Use tools that help you move around (mobility aids) if they are needed. These include: Canes. Walkers. Scooters. Crutches. Turn on the lights when you go into a dark area. Replace any light bulbs as soon as they burn out. Set up your furniture so you have a clear path. Avoid moving your furniture around. If any of your floors are uneven, fix them. If there are any pets around you, be aware of where they are. Review your medicines with your doctor. Some medicines can make you feel dizzy. This can increase your chance of falling. Ask your doctor what other things that you can do to help prevent falls. This information is not intended to replace advice given to you by your health care provider. Make sure you discuss any questions you have with your health care provider. Document Released: 04/22/2009 Document Revised: 12/02/2015 Document Reviewed: 07/31/2014 Elsevier Interactive Patient Education  2017 [employer].

## 2022-05-23 NOTE — Progress Notes (Signed)
I connected with Barnie Alderman today by telephone and verified that I am speaking with the correct person using two identifiers. Location patient: home Location provider: work Persons participating in the virtual visit: Gelsey Amyx, Glenna Durand LPN.   I discussed the limitations, risks, security and privacy concerns of performing an evaluation and management service by telephone and the availability of in person appointments. I also discussed with the patient that there may be a patient responsible charge related to this service. The patient expressed understanding and verbally consented to this telephonic visit.    Interactive audio and video telecommunications were attempted between this provider and patient, however failed, due to patient having technical difficulties OR patient did not have access to video capability.  We continued and completed visit with audio only.     Vital signs may be patient reported or missing.  Subjective:   Natasha Franco is a 71 y.o. female who presents for an Initial Medicare Annual Wellness Visit.  Review of Systems     Cardiac Risk Factors include: advanced age (>3mn, >>2women);diabetes mellitus;obesity (BMI >30kg/m2)     Objective:    Today's Vitals   05/23/22 0911  Weight: 175 lb (79.4 kg)  Height: 5' 1.5" (1.562 m)   Body mass index is 32.53 kg/m.     05/23/2022    9:19 AM 04/15/2019    8:02 AM 03/25/2019    1:45 PM 03/19/2019   10:27 AM  Advanced Directives  Does Patient Have a Medical Advance Directive? Yes Yes Yes Yes  Type of AParamed[place]of [place]Living will [place]Living will [place]Living will [place]Living will  Does patient want to make changes to medical advance directive?  No - Patient declined No - Patient declined   Copy of HPricein Chart? No - copy requested       Current Medications (verified) Outpatient  Encounter Medications as of 05/23/2022  Medication Sig   ALPRAZolam (XANAX) 0.5 MG tablet Take 1 tablet (0.5 mg total) by mouth 2 (two) times daily as needed.   azelastine (ASTELIN) 0.1 % nasal spray Place 2 sprays into both nostrils 2 (two) times daily. Use in each nostril as directed   Biotin 1 MG CAPS Take 1 mg by mouth daily.   Cholecalciferol (VITAMIN D3 PO) Take 2,000 Units by mouth daily at 6 (six) AM.   empagliflozin (JARDIANCE) 25 MG TABS tablet Take 1 tablet (25 mg total) by mouth daily.   escitalopram (LEXAPRO) 20 MG tablet Take 1 tablet (20 mg total) by mouth at bedtime.   estradiol (ESTRACE VAGINAL) 0.1 MG/GM vaginal cream daily at 6 (six) AM.   Evolocumab (REPATHA SURECLICK) 1462MG/ML SOAJ INJECT '140MG'$  UNDER THE SKIN EVERY 14 DAYS   fluticasone (FLONASE) 50 MCG/ACT nasal spray Place 2 sprays into both nostrils 2 (two) times daily.   fluticasone-salmeterol (WIXELA INHUB) 250-50 MCG/ACT AEPB Inhale 1 puff into the lungs in the morning and at bedtime.   ipratropium (ATROVENT) 0.06 % nasal spray    levocetirizine (XYZAL) 5 MG tablet Take 5 mg by mouth every evening.   Magnesium 300 MG CAPS Take 1 capsule by mouth daily at 6 (six) AM.   metFORMIN (GLUCOPHAGE) 1000 MG tablet Take 1 tablet (1,000 mg total) by mouth daily with breakfast.   montelukast (SINGULAIR) 10 MG tablet Take 10 mg by mouth at bedtime.   pantoprazole (PROTONIX) 40 MG tablet Take 1 tablet (40 mg  total) by mouth 2 (two) times daily before a meal. Take 30 minutes before meals   Semaglutide, 1 MG/DOSE, 4 MG/3ML SOPN Inject 1 mg as directed once a week.   topiramate (TOPAMAX) 100 MG tablet Take 100 mg by mouth at bedtime.    traZODone (DESYREL) 50 MG tablet Take 1 tablet by mouth at bedtime.   ursodiol (ACTIGALL) 300 MG capsule Take 1 capsule (300 mg total) by mouth 2 (two) times daily.   BREO ELLIPTA 200-25 MCG/INH AEPB 1 puff daily. (Patient not taking: Reported on 05/23/2022)   ketorolac (ACULAR) 0.5 % ophthalmic  solution Place 1 drop into the right eye 4 (four) times daily. (Patient not taking: Reported on 05/23/2022)   meloxicam (MOBIC) 15 MG tablet Take 0.5-1 tablets (7.5-15 mg total) by mouth daily. With food. (Patient not taking: Reported on 05/23/2022)   prednisoLONE acetate (PRED FORTE) 1 % ophthalmic suspension Place 1 drop into the right eye 4 (four) times daily. (Patient not taking: Reported on 05/23/2022)   No facility-administered encounter medications on file as of 05/23/2022.    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   BREAST BIOPSY Left 2014   BREAST SURGERY Bilateral 1984   reduction mammoplasty   CARPAL TUNNEL RELEASE     CESAREAN SECTION     JOINT REPLACEMENT     REDUCTION MAMMAPLASTY Bilateral    ROTATOR CUFF REPAIR     TONSILLECTOMY     TOTAL KNEE ARTHROPLASTY Left 03/25/2019   Procedure: Left Knee Arthroplasty;  Surgeon: Melrose Nakayama, MD;  Location: [facility];  Service: Orthopedics;  Laterality: Left;   VAGINAL HYSTERECTOMY     Family History  Problem Relation Age of Onset   Arthritis Mother    Early death Father    Diabetes Father    Alcohol abuse Father    COPD Father    Diabetes Mellitus II Father    Heart attack Father    Mental illness Father    Alcohol abuse Sister    Stroke Maternal Grandmother    Stroke Maternal  Grandfather    Diabetes Maternal Grandfather    Diabetes Paternal Grandfather    Depression Daughter    Hypotension Daughter        POTS   Colon cancer Neg Hx    Liver cancer Neg Hx    Stomach cancer Neg Hx    Esophageal cancer Neg Hx    Rectal cancer Neg Hx    Breast cancer Neg Hx    Social History   Socioeconomic History   Marital status: Married    Spouse name: Antony Haste   Number of children: 2   Years of education: Not on file   Highest education level: Not on file  Occupational History  Occupation: DOSHER  Tobacco Use   Smoking status: Former    Packs/day: 1.00    Years: 25.00    Total pack years: 25.00    Types: Cigarettes    Quit date: 09/01/2006    Years since quitting: 15.7   Smokeless tobacco: Never  Vaping Use   Vaping Use: Never used  Substance and Sexual Activity   Alcohol use: Yes    Comment: occ   Drug use: No   Sexual activity: Not Currently  Other Topics Concern   Not on file  Social History Narrative   Married 1979.   2 daughters.   4 grandkids.   Retired Therapist, sports from pressors here in hospital.   From [place].   Social Determinants of Health   Financial Resource Strain: Low Risk  (05/23/2022)   Overall Financial Resource Strain (CARDIA)    Difficulty of Paying Living Expenses: Not hard at all  Food Insecurity: No Food Insecurity (05/23/2022)   Hunger Vital Sign    Worried About Running Out of Food in the Last Year: Never true    Ran Out of Food in the Last Year: Never true  Transportation Needs: No Transportation Needs (05/23/2022)   PRAPARE - Hydrologist (Medical): No    Lack of Transportation (Non-Medical): No  Physical Activity: Inactive (05/23/2022)   Exercise Vital Sign    Days of Exercise per Week: 0 days    Minutes of Exercise per Session: 0 min  Stress: No Stress Concern Present (05/23/2022)   [place]    Feeling of  Stress : Not at all  Social Connections: Not on file    Tobacco Counseling Counseling given: Not Answered   Clinical Intake:  Pre-visit preparation completed: Yes  Pain : No/denies pain     Nutritional Status: BMI > 30  Obese Nutritional Risks: None Diabetes: Yes  How often do you need to have someone help you when you read instructions, pamphlets, or other written materials from your doctor or pharmacy?: 1 - Never What is the last grade level you completed in school?: nursing degree  Diabetic? Yes Nutrition Risk Assessment:  Has the patient had any N/V/D within the last 2 months?  No  Does the patient have any non-healing wounds?  No  Has the patient had any unintentional weight loss or weight gain?  No   Diabetes:  Is the patient diabetic?  Yes  If diabetic, was a CBG obtained today?  No  Did the patient bring in their glucometer from home?  No  How often do you monitor your CBG's? Does not.   Financial Strains and Diabetes Management:  Are you having any financial strains with the device, your supplies or your medication? No .  Does the patient want to be seen by Chronic Care Management for management of their diabetes?  No  Would the patient like to be referred to a Nutritionist or for Diabetic Management?  No   Diabetic Exams:  Diabetic Eye Exam: Overdue for diabetic eye exam. Pt has been advised about the importance in completing this exam. Patient advised to call and schedule an eye exam. Diabetic Foot Exam: Completed 02/23/2022   Interpreter Needed?: No  Information entered by :: NAllen LPN   Activities of Daily Living    05/23/2022    9:20 AM  In your present state of health, do you have any difficulty performing the following activities:  Hearing?  1  Vision? 0  Difficulty concentrating or making decisions? 0  Walking or climbing stairs? 0  Dressing or bathing? 0  Doing errands, shopping? 0  Preparing Food and eating ? N  Using the Toilet? N   In the past six months, have you accidently leaked urine? Y  Do you have problems with loss of bowel control? N  Managing your Medications? N  Managing your Finances? N  Housekeeping or managing your Housekeeping? N    Patient Care Team: Tonia Ghent, MD as PCP - General (Family Medicine) Belva Crome, MD as PCP - Cardiology (Cardiology)  Indicate any recent Medical Services you may have received from other than [facility] providers in the past year (date may be approximate).     Assessment:   This is a routine wellness examination for Sophina.  Hearing/Vision screen Vision Screening - Comments:: Regular eye exams, Dr. Ellie Lunch and Dr. Coralyn Pear  Dietary issues and exercise activities discussed: Current Exercise Habits: The patient does not participate in regular exercise at present   Goals Addressed             This Visit's Progress    Patient Stated       05/23/2022, no goals       Depression Screen    05/23/2022    9:20 AM 02/23/2022    2:25 PM  PHQ 2/9 Scores  PHQ - 2 Score 0 0    Fall Risk    05/23/2022    9:19 AM 02/23/2022    2:25 PM  [place] in the past year? 0 0  Number falls in past yr: 0 0  Injury with Fall? 0 0  Risk for fall due to : No Fall Risks History of fall(s)  Follow up Falls prevention discussed Falls evaluation completed    [place]:  Any stairs in or around the home? Yes  If so, are there any without handrails? Yes  Home free of loose throw rugs in walkways, pet beds, electrical cords, etc? Yes  Adequate lighting in your home to reduce risk of falls? Yes   ASSISTIVE DEVICES UTILIZED TO PREVENT FALLS:  Life alert? No  Use of a cane, walker or w/c? No  Grab bars in the bathroom? No  Shower chair or bench in shower? Yes  Elevated toilet seat or a handicapped toilet? Yes   TIMED UP AND GO:  Was the test performed? No .       Cognitive Function:        05/23/2022    9:22 AM   6CIT Screen  What Year? 0 points  What month? 0 points  What time? 0 points  Count back from 20 0 points  Months in reverse 0 points  Repeat phrase 2 points  Total Score 2 points    Immunizations Immunization History  Administered Date(s) Administered   Fluad Quad(high Dose 65+) 04/18/2022   Influenza Split 04/01/2016   Influenza, High Dose Seasonal PF 04/19/2018, 05/15/2019   Influenza,inj,Quad PF,6+ Mos 04/02/2017   Influenza-Unspecified 04/12/2015, 05/09/2016   Moderna Covid-19 Vaccine Bivalent Booster 67yr & up 05/06/2021   Moderna Sars-Covid-2 Vaccination 07/24/2019, 08/22/2019, 05/05/2020   Pneumococcal Conjugate-13 07/12/1998   Tdap 03/11/2012, 04/18/2022   Zoster Recombinat (Shingrix) 08/30/2020    TDAP status: Up to date  Flu Vaccine status: Up to date  Pneumococcal vaccine status: Due, Education has been provided regarding the importance of this vaccine. Advised may receive this  vaccine at local pharmacy or Health Dept. Aware to provide a copy of the vaccination record if obtained from local pharmacy or Health Dept. Verbalized acceptance and understanding.  Covid-19 vaccine status: Completed vaccines  Qualifies for Shingles Vaccine? Yes   Zostavax completed Yes   Shingrix Completed?: needs second dose  Screening Tests Health Maintenance  Topic Date Due   Medicare Annual Wellness (AWV)  Never done   Diabetic kidney evaluation - Urine ACR  Never done   Hepatitis C Screening  Never done   Pneumonia Vaccine 81+ Years old (2 - PPSV23 or PCV20) 06/30/2017   Lung Cancer Screening  02/13/2019   OPHTHALMOLOGY EXAM  07/15/2020   Zoster Vaccines- Shingrix (2 of 2) 10/25/2020   COVID-19 Vaccine (5 - Moderna series) 09/06/2021   HEMOGLOBIN A1C  08/21/2022   Diabetic kidney evaluation - GFR measurement  02/19/2023   FOOT EXAM  02/24/2023   MAMMOGRAM  03/16/2023   COLONOSCOPY (Pts 45-41yr Insurance coverage will need to be confirmed)  08/04/2029   TETANUS/TDAP   04/18/2032   INFLUENZA VACCINE  Completed   DEXA SCAN  Completed   HPV VACCINES  Aged Out    Health Maintenance  Health Maintenance Due  Topic Date Due   Medicare Annual Wellness (AWV)  Never done   Diabetic kidney evaluation - Urine ACR  Never done   Hepatitis C Screening  Never done   Pneumonia Vaccine 70 Years old (2 - PPSV23 or PCV20) 06/30/2017   Lung Cancer Screening  02/13/2019   OPHTHALMOLOGY EXAM  07/15/2020   Zoster Vaccines- Shingrix (2 of 2) 10/25/2020   COVID-19 Vaccine (5 - Moderna series) 09/06/2021    Colorectal cancer screening: No longer required.   Mammogram status: patient to schedule  Bone Density status: Completed 09/18/2019.   Lung Cancer Screening: (Low Dose CT Chest recommended if Age 70-80years, 30 pack-year currently smoking OR have quit w/in 15years.) does not qualify.   Lung Cancer Screening Referral: no  Additional Screening:  Hepatitis C Screening: does qualify;   Vision Screening: Recommended annual ophthalmology exams for early detection of glaucoma and other disorders of the eye. Is the patient up to date with their annual eye exam?  Yes  Who is the provider or what is the name of the office in which the patient attends annual eye exams? Dr. MEllie LunchIf pt is not established with a provider, would they like to be referred to a provider to establish care? No .   Dental Screening: Recommended annual dental exams for proper oral hygiene  Community Resource Referral / Chronic Care Management: CRR required this visit?  No   CCM required this visit?  No      Plan:     I have personally reviewed and noted the following in the patient's chart:   Medical and social history Use of alcohol, tobacco or illicit drugs  Current medications and supplements including opioid prescriptions. Patient is not currently taking opioid prescriptions. Functional ability and status Nutritional status Physical activity Advanced directives List of other  physicians Hospitalizations, surgeries, and ER visits in previous 12 months Vitals Screenings to include cognitive, depression, and falls Referrals and appointments  In addition, I have reviewed and discussed with patient certain preventive protocols, quality metrics, and best practice recommendations. A written personalized care plan for preventive services as well as general preventive health recommendations were provided to patient.     NKellie Simmering LPN   [id number]  Nurse Notes: none  Due  to this being a virtual visit, the after visit summary with patients personalized plan was offered to patient via mail or my-chart.  Patient would like to access on my-chart

## 2022-05-24 ENCOUNTER — Ambulatory Visit (INDEPENDENT_AMBULATORY_CARE_PROVIDER_SITE_OTHER): Payer: Medicare PPO | Admitting: Ophthalmology

## 2022-05-24 ENCOUNTER — Encounter (INDEPENDENT_AMBULATORY_CARE_PROVIDER_SITE_OTHER): Payer: Self-pay | Admitting: Ophthalmology

## 2022-05-24 DIAGNOSIS — H33321 Round hole, right eye: Secondary | ICD-10-CM | POA: Diagnosis not present

## 2022-05-24 DIAGNOSIS — Z961 Presence of intraocular lens: Secondary | ICD-10-CM | POA: Diagnosis not present

## 2022-05-24 DIAGNOSIS — E119 Type 2 diabetes mellitus without complications: Secondary | ICD-10-CM

## 2022-05-24 DIAGNOSIS — H33103 Unspecified retinoschisis, bilateral: Secondary | ICD-10-CM

## 2022-05-24 DIAGNOSIS — H35351 Cystoid macular degeneration, right eye: Secondary | ICD-10-CM | POA: Diagnosis not present

## 2022-05-24 DIAGNOSIS — H25813 Combined forms of age-related cataract, bilateral: Secondary | ICD-10-CM

## 2022-05-24 MED ORDER — PREDNISOLONE ACETATE 1 % OP SUSP: 1.0000 [drp] | Freq: Four times a day (QID) | OPHTHALMIC | 0 refills | Status: DC

## 2022-05-24 MED ORDER — KETOROLAC TROMETHAMINE 0.5 % OP SOLN: 1.0000 [drp] | Freq: Four times a day (QID) | OPHTHALMIC | 3 refills | Status: DC

## 2022-05-24 MED ORDER — TRIAMCINOLONE ACETONIDE 40 MG/ML IJ SUSP FOR KALEIDOSCOPE
4.0000 mg | INTRAMUSCULAR | Status: AC | PRN
  Administered 2022-05-24: 4 mg via INTRAVITREAL

## 2022-05-26 ENCOUNTER — Ambulatory Visit: Payer: Medicare PPO | Admitting: Family Medicine

## 2022-05-26 ENCOUNTER — Ambulatory Visit
Admission: RE | Admit: 2022-05-26 | Discharge: 2022-05-26 | Disposition: A | Discharge status: Home / Self Care | Payer: Medicare PPO | Source: Ambulatory Visit | Attending: Family Medicine | Admitting: Family Medicine

## 2022-05-26 ENCOUNTER — Encounter: Payer: Self-pay | Admitting: Family Medicine

## 2022-05-26 VITALS — BP 122/78 | HR 77 | Temp 97.7°F | Ht 61.5 in | Wt 175.0 lb

## 2022-05-26 DIAGNOSIS — R202 Paresthesia of skin: Secondary | ICD-10-CM

## 2022-05-26 DIAGNOSIS — E119 Type 2 diabetes mellitus without complications: Secondary | ICD-10-CM

## 2022-05-26 DIAGNOSIS — Z789 Other specified health status: Secondary | ICD-10-CM | POA: Diagnosis not present

## 2022-05-26 DIAGNOSIS — Z1231 Encounter for screening mammogram for malignant neoplasm of breast: Secondary | ICD-10-CM

## 2022-05-26 DIAGNOSIS — Z23 Encounter for immunization: Secondary | ICD-10-CM

## 2022-05-26 LAB — POCT GLYCOSYLATED HEMOGLOBIN (HGB A1C): Hemoglobin A1C: 6.1 % — AB (ref 4.0–5.6)

## 2022-05-26 LAB — VITAMIN B12: Vitamin B-12: 563 pg/mL (ref 211–911)

## 2022-05-26 LAB — TSH: TSH: 1.28 u[IU]/mL (ref 0.35–5.50)

## 2022-05-26 MED ORDER — METFORMIN HCL 1000 MG PO TABS: ORAL_TABLET | ORAL | Status: DC

## 2022-05-26 NOTE — Patient Instructions (Addendum)
Go to the lab on the way out.   If you have mychart we'll likely use that to update you.    Take care.  Glad to see you. Stop metformin and see if the diarrhea is better.  Update me as needed. Plan on recheck in about 3 months with A1c at the visit.

## 2022-05-26 NOTE — Progress Notes (Signed)
Diabetes:  Using medications without difficulties: episodic diarrhea.  On metformin, ozempic, and jardiance.   Hypoglycemic episodes: no  Hyperglycemic episodes: no Feet problems: L foot is more sensitive on the top of the foot.  She thpught it was possibly related to shoe change.  No sx in R foot.  Some tingling in the L toes, variable, episodic.  Blood Sugars averaging: rarely checked.   eye exam within last year: 05/24/22.   Statin intolerant.   Bruising, extensor side of B arms.  Not on flexor side. Not on aspirin.  Off meloxicam in the meantime.  No other bleeding, no blood in stool, etc.    Meds, vitals, and allergies reviewed.   ROS: Per HPI unless specifically indicated in ROS section   GEN: nad, alert and oriented HEENT: ncat NECK: supple w/o LA CV: rrr. PULM: ctab, no inc wob ABD: soft, +bs EXT: no edema SKIN: no acute rash but minimal extensive bruising on the left hand.  Diabetic foot exam: Normal inspection No skin breakdown No calluses  Normal DP pulses Normal sensation to light touch and dec but not absent sensation to monofilament testing Nails normal

## 2022-05-28 DIAGNOSIS — Z789 Other specified health status: Secondary | ICD-10-CM | POA: Insufficient documentation

## 2022-05-28 NOTE — Assessment & Plan Note (Signed)
With previous LFT elevation noted.  Has tolerated Repatha.

## 2022-05-28 NOTE — Assessment & Plan Note (Signed)
See notes on labs regarding paresthesia. Stop metformin and see if the diarrhea is better.  Update me as needed. Plan on recheck in about 3 months with A1c at the visit.  We talked about her potentially needing a slightly higher A1c to limit the risk of hypoglycemia.  Her bruising appears to be a benign and normal issue, especially since she had recent normal platelet level on CBC.  Discussed.

## 2022-06-06 ENCOUNTER — Other Ambulatory Visit: Payer: Self-pay

## 2022-06-06 MED ORDER — PANTOPRAZOLE SODIUM 40 MG PO TBEC: 40.0000 mg | DELAYED_RELEASE_TABLET | Freq: Two times a day (BID) | ORAL | 1 refills | Status: DC

## 2022-06-26 NOTE — Progress Notes (Signed)
[facility] Note  07/06/2022     CHIEF COMPLAINT Patient presents for Retina Follow Up   HISTORY OF PRESENT ILLNESS: Natasha Franco is a 71 y.o. female who presents to the clinic today for:   HPI     Retina Follow Up   Patient presents with  Other.  In right eye.  This started months ago.  Severity is moderate.  Duration of 6 weeks.  Since onset it is stable.  I, the attending physician,  performed the HPI with the patient and updated documentation appropriately.        Comments   Patient feels that the vision is the same. She is using Ketorolac and Pred OD QID. She does not check her sugars, her A1C is 6.1.      Last edited by Bernarda Caffey, MD on 07/07/2022  2:44 AM.    Pt is still using PF and ketorolac QID OD   Referring physician: Luberta Mutter MD [place], [place] [id number]  HISTORICAL INFORMATION:   Selected notes from the MEDICAL RECORD NUMBER Referred by Dr. Ellie Lunch for retinoschisis OD LEE:  Ocular Hx- HH plaque X2 OS (2018), cataracts, progressive retinoschisis OD PMH-    CURRENT MEDICATIONS: Current Outpatient Medications (Ophthalmic Drugs)  Medication Sig   ketorolac (ACULAR) 0.5 % ophthalmic solution Place 1 drop into the right eye 4 (four) times daily.   prednisoLONE acetate (PRED FORTE) 1 % ophthalmic suspension Place 1 drop into the right eye 4 (four) times daily.   No current facility-administered medications for this visit. (Ophthalmic Drugs)   Current Outpatient Medications (Other)  Medication Sig   ALPRAZolam (XANAX) 0.5 MG tablet Take 1 tablet (0.5 mg total) by mouth 2 (two) times daily as needed.   azelastine (ASTELIN) 0.1 % nasal spray Place 2 sprays into both nostrils 2 (two) times daily. Use in each nostril as directed   Biotin 1 MG CAPS Take 1 mg by mouth daily.   Cholecalciferol (VITAMIN D3 PO) Take 2,000 Units by mouth daily at 6 (six) AM.   empagliflozin (JARDIANCE) 25 MG TABS tablet  Take 1 tablet (25 mg total) by mouth daily.   escitalopram (LEXAPRO) 20 MG tablet Take 1 tablet (20 mg total) by mouth at bedtime.   estradiol (ESTRACE VAGINAL) 0.1 MG/GM vaginal cream daily at 6 (six) AM.   Evolocumab (REPATHA SURECLICK) 630 MG/ML SOAJ INJECT '140MG'$  UNDER THE SKIN EVERY 14 DAYS   fluticasone (FLONASE) 50 MCG/ACT nasal spray Place 2 sprays into both nostrils 2 (two) times daily.   fluticasone-salmeterol (WIXELA INHUB) 250-50 MCG/ACT AEPB Inhale 1 puff into the lungs in the morning and at bedtime.   ibuprofen (IBU) 800 MG tablet TAKE ONE TABLET BY MOUTH 3 TIMES DAILY WITH FOOD OR MILK AS NEEDED   ipratropium (ATROVENT) 0.06 % nasal spray    levocetirizine (XYZAL) 5 MG tablet Take 5 mg by mouth every evening.   Magnesium 300 MG CAPS Take 1 capsule by mouth daily at 6 (six) AM.   metFORMIN (GLUCOPHAGE) 1000 MG tablet Held as of 05/26/22   montelukast (SINGULAIR) 10 MG tablet Take 10 mg by mouth at bedtime.   OZEMPIC, 1 MG/DOSE, 4 MG/3ML SOPN INJECT 1 MG AS DIRECTED ONCE A WEEK   pantoprazole (PROTONIX) 40 MG tablet Take 1 tablet (40 mg total) by mouth 2 (two) times daily before a meal. Take 30 minutes before meals   topiramate (TOPAMAX) 100 MG tablet Take 100 mg by  mouth at bedtime.    traZODone (DESYREL) 50 MG tablet Take 1 tablet by mouth at bedtime.   ursodiol (ACTIGALL) 300 MG capsule Take 1 capsule (300 mg total) by mouth 2 (two) times daily.   No current facility-administered medications for this visit. (Other)   REVIEW OF SYSTEMS: ROS   Positive for: Gastrointestinal, Neurological, Musculoskeletal, Endocrine, Eyes Negative for: Constitutional, Skin, Genitourinary, HENT, Cardiovascular, Respiratory, Psychiatric, Allergic/Imm, Heme/Lymph Last edited by Annie Paras, COT on 07/06/2022  9:00 AM.     ALLERGIES Allergies  Allergen Reactions   Invokana [Canagliflozin]     VAGINAL YEAST INFECTIONS   Statins     LFT elevation   Versed [Midazolam]     Talkative  after med use   Zetia [Ezetimibe] Cough   Bupropion Hcl Rash   PAST MEDICAL HISTORY Past Medical History:  Diagnosis Date   Arthritis    Barrett's esophagus    Chicken pox    Complication of anesthesia    Diabetes mellitus without complication ([place])    Diverticulitis    Family history of polyps in the colon    GERD (gastroesophageal reflux disease)    Hay fever    Hepatitis    Hyperlipidemia    Per pt   Incontinent of urine    Migraines    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   BREAST BIOPSY Left 2014   BREAST SURGERY Bilateral 1984   reduction mammoplasty   CARPAL TUNNEL RELEASE     CESAREAN SECTION     JOINT REPLACEMENT     REDUCTION MAMMAPLASTY Bilateral    ROTATOR CUFF REPAIR     TONSILLECTOMY     TOTAL KNEE ARTHROPLASTY Left 03/25/2019   Procedure: Left Knee Arthroplasty;  Surgeon: Melrose Nakayama, MD;  Location: [facility];  Service: Orthopedics;  Laterality: Left;   VAGINAL HYSTERECTOMY     FAMILY HISTORY Family History  Problem Relation Age of Onset   Arthritis Mother    Early death Father    Diabetes Father    Alcohol abuse Father    COPD Father    Diabetes Mellitus II Father    Heart attack Father    Mental illness Father    Alcohol abuse Sister    Stroke Maternal Grandmother    Stroke Maternal Grandfather    Diabetes Maternal Grandfather    Diabetes Paternal Grandfather    Depression Daughter    Hypotension Daughter        POTS   Colon cancer Neg Hx    Liver cancer Neg Hx    Stomach cancer Neg Hx    Esophageal cancer Neg Hx    Rectal cancer Neg Hx    Breast cancer Neg Hx    SOCIAL HISTORY Social History   Tobacco Use   Smoking status: Former    Packs/day: 1.00    Years: 25.00    Total pack years: 25.00    Types: Cigarettes    Quit date: 09/01/2006    Years since quitting: 15.8    Smokeless tobacco: Never  Vaping Use   Vaping Use: Never used  Substance Use Topics   Alcohol use: Yes    Comment: occ   Drug use: No       OPHTHALMIC EXAM:  Base Eye Exam     Visual Acuity (Snellen -  Linear)       Right Left   Dist [place] 20/50 20/20   Dist ph [place] 20/25 -1          Tonometry (Tonopen, 9:05 AM)       Right Left   Pressure 17 12         Pupils       Dark Light Shape React APD   Right 3 2 Round Minimal None   Left 3 2 Round Minimal None         Visual Fields       Left Right    Full Full         Extraocular Movement       Right Left    Full, Ortho Full, Ortho         Neuro/Psych     Oriented x3: Yes   Mood/Affect: Normal         Dilation     Both eyes: 1.0% Mydriacyl, 2.5% Phenylephrine @ 9:00 AM           Slit Lamp and Fundus Exam     Slit Lamp Exam       Right Left   Lids/Lashes Dermatochalasis - upper lid Dermatochalasis - upper lid, mild MGD, Telangiectasia   Conjunctiva/Sclera White and quiet, STK ST quad, mild SCH improving White and quiet   Cornea 1+ Punctate epithelial erosions inferiorly, well healed cataract wound, tear film debris 1+ PEE, mild tear film debris, well healed cataract wound   Anterior Chamber deep and clear, no cell or flare deep and clear, no cell or flare   Iris Round and dilated Round and dilated   Lens PC IOL in good position PC IOL in good position   Anterior Vitreous Vitreous syneresis, +fine pigment, Posterior vitreous detachment, pigmented debris settled inferiory, vitreous condensations Vitreous syneresis, no pigment, Posterior vitreous detachment, vitreous condensations         Fundus Exam       Right Left   Disc Pink and Sharp, mild tilt, inferior PPA Pink and Sharp, tilted, inferior PPA   C/D Ratio 0.5 0.5   Macula Flat, Blunted foveal reflex, interval resolution of central cystic changes, mild RPE mottling greatest centrally, No heme or edema Flat, Blunted foveal reflex, mild  RPE mottling, No heme or edema   Vessels attenuated, mild tortuosity, mild AV crossing changes attenuated, mild tortuosity   Periphery Attached, inferior schisis from 0400-0730 with pigment clumping and pigment atrophy within schisis cavity, outer retinal hole at 0700 -- good barricade laser changes posterior to schisis; no new RT/RD/lattice or schisis Attached, very shallow schisis IT periphery           Refraction     Manifest Refraction       Sphere Dist VA   Right -0.75 20/20   Left             IMAGING AND PROCEDURES  Imaging and Procedures for 07/06/2022  OCT, Retina - OU - Both Eyes       Right Eye Quality was good. Central Foveal Thickness: 299. Progression has improved. Findings include normal foveal contour, no IRF, no SRF (Interval resolution of IRF / central CME, mild decrease in central ellipsoid signal -- stable, IT schisis with outer retinal holes caught on widefield -- not imaged today).   Left Eye Quality was good. Progression has been stable. Findings include normal foveal contour, no IRF, no SRF, vitreomacular adhesion (Shallow schisis IT periphery caught on widefield -  not imaged today).   Notes *Images captured and stored on drive  Diagnosis / Impression:  OD: Interval resolution of IRF / central CME, mild decrease in central ellipsoid signal -- stable; IT schisis with outer retinal holes caught on widefield -- not imaged today OS: NFP; no IRF/SRF; Shallow schisis IT periphery caught on widefield -- not imaged today  Clinical management:  See below  Abbreviations: NFP - Normal foveal profile. CME - cystoid macular edema. PED - pigment epithelial detachment. IRF - intraretinal fluid. SRF - subretinal fluid. EZ - ellipsoid zone. ERM - epiretinal membrane. ORA - outer retinal atrophy. ORT - outer retinal tubulation. SRHM - subretinal hyper-reflective material. IRHM - intraretinal hyper-reflective material            ASSESSMENT/PLAN:    ICD-10-CM    1. 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 Diabetes mellitus type 2 without retinopathy ([place])  E11.9     5. Pseudophakia, both eyes  Z96.1      1. CME OD - development of IRF and sliver of SRF centrally -- consistent with CME, noted on 07.31.23 visit - suspect delayed [place], s/p CEIOL with Dr. Ellie Lunch in March - started on PF and Ketorolac QID OD on 07.31.23 - recurrent CME following taper after 08.22.23 visit - s/p STK OD #1 (11.15.23) - today, OCT shows Interval resolution of IRF / central CME, mild decrease in central ellipsoid signal -- stable,  - BCVA improved to 20/25 from 20/30 OD - start PF taper -- TID OD x2 wks, then BID until next visit - STK informed consent obtained and signed, 11.15.23 (OD)  - f/u 6 weeks, DFE, OCT  2,3. Retinoschisis OU - OD: inferior schisis 0400-0730 with pigment clumping and pigment atrophy within schisis cavity, outer retinal hole at 0700 - OS: very shallow schisis IT arcades  - pt reports intermittent photopsias and floaters OU -- likely unrelated to schisis - s/p laser retinopexy OD (01.30.23) -- good barricade laser changes posterior to schisis - f/u 3-4 months, DFE, OCT  4. Diabetes mellitus, type 2 without retinopathy - The incidence, risk factors for progression, natural history and treatment options for diabetic retinopathy  were discussed with patient.   - The need for close monitoring of blood glucose, blood pressure, and serum lipids, avoiding cigarette or any type of tobacco, and the need for long term follow up was also discussed with patient. - f/u in 1 year, sooner prn  5. Pseudophakia OU  - s/p CE/IOL OU (Dr. Ellie Lunch, March / April 2023)  - IOLs in perfect position, doing well  - mild CME OD as above  - monitor  6. Dry eyes OU (OD > OS) - recommend artificial tears and lubricating ointment as needed    Ophthalmic Meds Ordered this visit:  No orders of the defined types were placed in this encounter.    Return in about 6 weeks (around 08/17/2022) for f/u CME OD, DFE, OCT.  There are no Patient Instructions on file for this visit.   Explained the diagnoses, plan, and follow up with the patient and they expressed understanding.  Patient expressed understanding of the importance of proper follow up care.   This document serves as a record of services personally performed by Gardiner Sleeper, MD, PhD. It was created on their  behalf by San Jetty. Owens Shark, OA an ophthalmic technician. The creation of this record is the provider's dictation and/or activities during the visit.    Electronically signed by: San Jetty. Owens Shark, [place] 12.18.2023 2:46 AM  Gardiner Sleeper, M.D., Ph.D. Diseases & Surgery of the Retina and Vitreous [facility]  I have reviewed the above documentation for accuracy and completeness, and I agree with the above. Gardiner Sleeper, M.D., Ph.D. 07/07/22 2:51 AM   Abbreviations: M myopia (nearsighted); A astigmatism; H hyperopia (farsighted); P presbyopia; Mrx spectacle prescription;  CTL contact lenses; OD right eye; OS left eye; OU both eyes  XT exotropia; ET esotropia; PEK punctate epithelial keratitis; PEE punctate epithelial erosions; DES dry eye syndrome; MGD meibomian gland dysfunction; ATs artificial tears; PFAT's preservative free artificial tears; [place] nuclear sclerotic cataract; PSC posterior subcapsular cataract; ERM epi-retinal membrane; PVD posterior vitreous detachment; RD retinal detachment; DM diabetes mellitus; DR diabetic retinopathy; NPDR non-proliferative diabetic retinopathy; PDR proliferative diabetic retinopathy; CSME clinically significant macular edema; DME diabetic macular edema; dbh dot blot hemorrhages; CWS cotton wool spot; POAG primary open angle glaucoma; C/D cup-to-disc ratio; HVF humphrey visual field; GVF goldmann visual field; OCT  optical coherence tomography; IOP intraocular pressure; BRVO Branch retinal vein occlusion; CRVO central retinal vein occlusion; CRAO central retinal artery occlusion; BRAO branch retinal artery occlusion; RT retinal tear; SB scleral buckle; PPV pars plana vitrectomy; VH Vitreous hemorrhage; PRP panretinal laser photocoagulation; IVK intravitreal kenalog; VMT vitreomacular traction; MH Macular hole;  NVD neovascularization of the disc; NVE neovascularization elsewhere; AREDS age related eye disease study; ARMD age related macular degeneration; POAG primary open angle glaucoma; EBMD epithelial/anterior basement membrane dystrophy; ACIOL anterior chamber intraocular lens; IOL intraocular lens; PCIOL posterior chamber intraocular lens; Phaco/IOL phacoemulsification with intraocular lens placement; [place] photorefractive keratectomy; LASIK laser assisted in situ keratomileusis; HTN hypertension; DM diabetes mellitus; COPD chronic obstructive pulmonary disease

## 2022-06-27 ENCOUNTER — Other Ambulatory Visit: Payer: Self-pay | Admitting: Family Medicine

## 2022-07-04 ENCOUNTER — Other Ambulatory Visit: Payer: Self-pay | Admitting: Family Medicine

## 2022-07-06 ENCOUNTER — Ambulatory Visit (INDEPENDENT_AMBULATORY_CARE_PROVIDER_SITE_OTHER): Payer: Medicare PPO | Admitting: Ophthalmology

## 2022-07-06 DIAGNOSIS — H33321 Round hole, right eye: Secondary | ICD-10-CM

## 2022-07-06 DIAGNOSIS — H33103 Unspecified retinoschisis, bilateral: Secondary | ICD-10-CM | POA: Diagnosis not present

## 2022-07-06 DIAGNOSIS — Z961 Presence of intraocular lens: Secondary | ICD-10-CM | POA: Diagnosis not present

## 2022-07-06 DIAGNOSIS — H35351 Cystoid macular degeneration, right eye: Secondary | ICD-10-CM | POA: Diagnosis not present

## 2022-07-06 DIAGNOSIS — E119 Type 2 diabetes mellitus without complications: Secondary | ICD-10-CM

## 2022-07-07 ENCOUNTER — Encounter (INDEPENDENT_AMBULATORY_CARE_PROVIDER_SITE_OTHER): Payer: Self-pay | Admitting: Ophthalmology

## 2022-07-17 ENCOUNTER — Other Ambulatory Visit (HOSPITAL_COMMUNITY): Payer: Self-pay

## 2022-07-18 DIAGNOSIS — J4520 Mild intermittent asthma, uncomplicated: Secondary | ICD-10-CM | POA: Diagnosis not present

## 2022-07-18 DIAGNOSIS — K219 Gastro-esophageal reflux disease without esophagitis: Secondary | ICD-10-CM | POA: Diagnosis not present

## 2022-07-18 DIAGNOSIS — J300 Vasomotor rhinitis: Secondary | ICD-10-CM | POA: Diagnosis not present

## 2022-07-24 ENCOUNTER — Encounter: Payer: Self-pay | Admitting: Gastroenterology

## 2022-08-10 NOTE — Progress Notes (Addendum)
[facility] Note  08/17/2022     CHIEF COMPLAINT Patient presents for Retina Follow Up   HISTORY OF PRESENT ILLNESS: Natasha Franco is a 72 y.o. female who presents to the clinic today for:   HPI     Retina Follow Up   Patient presents with  Other.  In right eye.  This started months ago.  Severity is moderate.  Duration of 6 weeks.  Since onset it is stable.  I, the attending physician,  performed the HPI with the patient and updated documentation appropriately.        Comments   Patient feels that the vision is the same. She is using Pred OD QD and Ketorolac OD QD. She does not check her blood sugar.       Last edited by Bernarda Caffey, MD on 08/17/2022 10:20 AM.    Pt states vision is stable, she has tapered down to one drop of PF a day   Referring physician: Luberta Mutter MD [place], [place] [id number]  HISTORICAL INFORMATION:   Selected notes from the MEDICAL RECORD NUMBER Referred by Dr. Ellie Lunch for retinoschisis OD LEE:  Ocular Hx- HH plaque X2 OS (2018), cataracts, progressive retinoschisis OD PMH-    CURRENT MEDICATIONS: Current Outpatient Medications (Ophthalmic Drugs)  Medication Sig   ketorolac (ACULAR) 0.5 % ophthalmic solution Place 1 drop into the right eye 4 (four) times daily.   prednisoLONE acetate (PRED FORTE) 1 % ophthalmic suspension Place 1 drop into the right eye daily.   No current facility-administered medications for this visit. (Ophthalmic Drugs)   Current Outpatient Medications (Other)  Medication Sig   ALPRAZolam (XANAX) 0.5 MG tablet Take 1 tablet (0.5 mg total) by mouth 2 (two) times daily as needed.   azelastine (ASTELIN) 0.1 % nasal spray Place 2 sprays into both nostrils 2 (two) times daily. Use in each nostril as directed   Biotin 1 MG CAPS Take 1 mg by mouth daily.   Cholecalciferol (VITAMIN D3 PO) Take 2,000 Units by mouth daily at 6 (six) AM.   empagliflozin (JARDIANCE) 25 MG TABS  tablet Take 1 tablet (25 mg total) by mouth daily.   escitalopram (LEXAPRO) 20 MG tablet Take 1 tablet (20 mg total) by mouth at bedtime.   estradiol (ESTRACE VAGINAL) 0.1 MG/GM vaginal cream daily at 6 (six) AM.   Evolocumab (REPATHA SURECLICK) 497 MG/ML SOAJ INJECT '140MG'$  UNDER THE SKIN EVERY 14 DAYS   fluticasone (FLONASE) 50 MCG/ACT nasal spray Place 2 sprays into both nostrils 2 (two) times daily.   fluticasone-salmeterol (WIXELA INHUB) 250-50 MCG/ACT AEPB Inhale 1 puff into the lungs in the morning and at bedtime.   ibuprofen (IBU) 800 MG tablet TAKE ONE TABLET BY MOUTH 3 TIMES DAILY WITH FOOD OR MILK AS NEEDED   ipratropium (ATROVENT) 0.06 % nasal spray    levocetirizine (XYZAL) 5 MG tablet Take 5 mg by mouth every evening.   Magnesium 300 MG CAPS Take 1 capsule by mouth daily at 6 (six) AM.   metFORMIN (GLUCOPHAGE) 1000 MG tablet Held as of 05/26/22   montelukast (SINGULAIR) 10 MG tablet Take 10 mg by mouth at bedtime.   OZEMPIC, 1 MG/DOSE, 4 MG/3ML SOPN INJECT 1 MG AS DIRECTED ONCE A WEEK   pantoprazole (PROTONIX) 40 MG tablet Take 1 tablet (40 mg total) by mouth 2 (two) times daily before a meal. Take 30 minutes before meals   topiramate (TOPAMAX) 100 MG tablet Take  100 mg by mouth at bedtime.    traZODone (DESYREL) 50 MG tablet Take 1 tablet by mouth at bedtime.   ursodiol (ACTIGALL) 300 MG capsule Take 1 capsule (300 mg total) by mouth 2 (two) times daily.   No current facility-administered medications for this visit. (Other)   REVIEW OF SYSTEMS: ROS   Positive for: Gastrointestinal, Neurological, Musculoskeletal, Endocrine, Eyes Negative for: Constitutional, Skin, Genitourinary, HENT, Cardiovascular, Respiratory, Psychiatric, Allergic/Imm, Heme/Lymph Last edited by Annie Paras, COT on 08/17/2022  9:11 AM.     ALLERGIES Allergies  Allergen Reactions   Invokana [Canagliflozin]     VAGINAL YEAST INFECTIONS   Statins     LFT elevation   Versed [Midazolam]      Talkative after med use   Zetia [Ezetimibe] Cough   Bupropion Hcl Rash   PAST MEDICAL HISTORY Past Medical History:  Diagnosis Date   Arthritis    Barrett's esophagus    Chicken pox    Complication of anesthesia    Diabetes mellitus without complication ([place])    Diverticulitis    Family history of polyps in the colon    GERD (gastroesophageal reflux disease)    Hay fever    Hepatitis    Hyperlipidemia    Per pt   Incontinent of urine    Migraines    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   BREAST BIOPSY Left 2014   BREAST SURGERY Bilateral 1984   reduction mammoplasty   CARPAL TUNNEL RELEASE     CESAREAN SECTION     JOINT REPLACEMENT     REDUCTION MAMMAPLASTY Bilateral    ROTATOR CUFF REPAIR     TONSILLECTOMY     TOTAL KNEE ARTHROPLASTY Left 03/25/2019   Procedure: Left Knee Arthroplasty;  Surgeon: Melrose Nakayama, MD;  Location: [facility];  Service: Orthopedics;  Laterality: Left;   VAGINAL HYSTERECTOMY     FAMILY HISTORY Family History  Problem Relation Age of Onset   Arthritis Mother    Early death Father    Diabetes Father    Alcohol abuse Father    COPD Father    Diabetes Mellitus II Father    Heart attack Father    Mental illness Father    Alcohol abuse Sister    Stroke Maternal Grandmother    Stroke Maternal Grandfather    Diabetes Maternal Grandfather    Diabetes Paternal Grandfather    Depression Daughter    Hypotension Daughter        POTS   Colon cancer Neg Hx    Liver cancer Neg Hx    Stomach cancer Neg Hx    Esophageal cancer Neg Hx    Rectal cancer Neg Hx    Breast cancer Neg Hx    SOCIAL HISTORY Social History   Tobacco Use   Smoking status: Former    Packs/day: 1.00    Years: 25.00    Total pack years: 25.00    Types: Cigarettes    Quit date: 09/01/2006    Years since quitting:  15.9   Smokeless tobacco: Never  Vaping Use   Vaping Use: Never used  Substance Use Topics   Alcohol use: Yes    Comment: occ   Drug use: No       OPHTHALMIC EXAM:  Base Eye Exam  Visual Acuity (Snellen - Linear)       Right Left   Dist [place] 20/30 20/25   Dist ph [place] 20/25          Tonometry (Tonopen, 9:14 AM)       Right Left   Pressure 18 19         Pupils       Dark Light Shape React APD   Right 3 2 Round Minimal None   Left 3 2 Round Minimal None         Visual Fields       Left Right    Full Full         Extraocular Movement       Right Left    Full, Ortho Full, Ortho         Neuro/Psych     Oriented x3: Yes   Mood/Affect: Normal         Dilation     Both eyes: 1.0% Mydriacyl, 2.5% Phenylephrine @ 9:12 AM           Slit Lamp and Fundus Exam     Slit Lamp Exam       Right Left   Lids/Lashes Dermatochalasis - upper lid Dermatochalasis - upper lid, mild MGD, Telangiectasia   Conjunctiva/Sclera White and quiet, trace STK ST quad White and quiet   Cornea 1+ Punctate epithelial erosions inferiorly, well healed cataract wound, tear film debris 1+ PEE, mild tear film debris, well healed cataract wound   Anterior Chamber deep and clear, no cell or flare deep and clear, no cell or flare   Iris Round and dilated Round and dilated   Lens PC IOL in good position PC IOL in good position   Anterior Vitreous Vitreous syneresis, +fine pigment, Posterior vitreous detachment, pigmented debris settled inferiory, vitreous condensations Vitreous syneresis, no pigment, Posterior vitreous detachment, vitreous condensations         Fundus Exam       Right Left   Disc Pink and Sharp, mild tilt, inferior PPA Pink and Sharp, tilted, inferior PPA   C/D Ratio 0.5 0.5   Macula Flat, Blunted foveal reflex, stable resolution of central cystic changes, mild RPE mottling greatest centrally, No heme or edema Flat, Blunted foveal reflex, mild RPE  mottling, No heme or edema   Vessels mild attenuation, mild AV crossing changes, mild tortuosity attenuated, mild tortuosity   Periphery Attached, inferior schisis from 0400-0730 with pigment clumping and pigment atrophy within schisis cavity, outer retinal hole at 0700 -- good barricade laser changes posterior to schisis; no new RT/RD/lattice or schisis Attached, very shallow schisis IT periphery           IMAGING AND PROCEDURES  Imaging and Procedures for 08/17/2022  OCT, Retina - OU - Both Eyes       Right Eye Quality was good. Central Foveal Thickness: 282. Progression has improved. Findings include normal foveal contour, no IRF, no SRF (stable resolution of IRF / central CME, mild interval improvement in central ellipsoid signal -- stable, IT schisis with outer retinal holes caught on widefield -- not imaged today).   Left Eye Quality was good. Central Foveal Thickness: 280. Progression has been stable. Findings include normal foveal contour, no IRF, no SRF, vitreomacular adhesion (Shallow schisis IT periphery caught on widefield - not imaged today).   Notes *Images captured and stored on drive  Diagnosis / Impression:  OD: stable resolution of IRF / central CME; mild interval improvement in  central ellipsoid signal; IT schisis with outer retinal holes caught on widefield -- not imaged today OS: NFP; no IRF/SRF; Shallow schisis IT periphery caught on widefield -- not imaged today  Clinical management:  See below  Abbreviations: NFP - Normal foveal profile. CME - cystoid macular edema. PED - pigment epithelial detachment. IRF - intraretinal fluid. SRF - subretinal fluid. EZ - ellipsoid zone. ERM - epiretinal membrane. ORA - outer retinal atrophy. ORT - outer retinal tubulation. SRHM - subretinal hyper-reflective material. IRHM - intraretinal hyper-reflective material            ASSESSMENT/PLAN:    ICD-10-CM   1. 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 Diabetes mellitus type 2 without retinopathy ([place])  E11.9     5. Pseudophakia, both eyes  Z96.1     6. Dry eyes  H04.123      1. CME OD - development of IRF and sliver of SRF centrally -- consistent with CME, noted on 07.31.23 visit - suspect delayed [place], s/p CEIOL with Dr. Ellie Lunch in March - started on PF and Ketorolac QID OD on 07.31.23 - recurrent CME following taper after 08.22.23 visit - s/p STK OD #1 (11.15.23) - today, OCT shows stable resolution of IRF / central CME, mild interval improvement in central ellipsoid signal - BCVA 20/25 -- stable - pt has self tapered PF down to qdaily -- cont qdaily until follow up in 8 weeks - STK informed consent obtained and signed, 11.15.23 (OD)  - f/u 8 weeks, DFE, OCT  2,3. Retinoschisis OU - OD: inferior schisis 0400-0730 with pigment clumping and pigment atrophy within schisis cavity, outer retinal hole at 0700 - OS: very shallow schisis IT arcades  - pt reports intermittent photopsias and floaters OU -- likely unrelated to schisis - s/p laser retinopexy OD (01.30.23) -- good barricade laser changes posterior to schisis - f/u 3-4 months, DFE, OCT  4. Diabetes mellitus, type 2 without retinopathy - The incidence, risk factors for progression, natural history and treatment options for diabetic retinopathy  were discussed with patient.   - The need for close monitoring of blood glucose, blood pressure, and serum lipids, avoiding cigarette or any type of tobacco, and the need for long term follow up was also discussed with patient. - f/u in 1 year, sooner prn  5. Pseudophakia OU  - s/p CE/IOL OU (Dr. Ellie Lunch, March / April 2023)  - IOLs in perfect position, doing well  - mild CME OD as above  - monitor  6. Dry eyes OU (OD > OS) - recommend artificial tears and lubricating ointment as needed   Ophthalmic Meds Ordered this  visit:  Meds ordered this encounter  Medications   prednisoLONE acetate (PRED FORTE) 1 % ophthalmic suspension    Sig: Place 1 drop into the right eye daily.    Dispense:  5 mL    Refill:  0     Return in about 8 weeks (around 10/12/2022) for f/u CME OD, DFE, OCT.  There are no Patient Instructions on file for this visit.   Explained the diagnoses, plan, and follow up with the patient and they expressed understanding.  Patient expressed understanding of the importance of proper follow up care.   This document serves as a record of services personally performed by Aaron Edelman  Antony Haste, MD, PhD. It was created on their behalf by San Jetty. Owens Shark, OA an ophthalmic technician. The creation of this record is the provider's dictation and/or activities during the visit.    Electronically signed by: San Jetty. Owens Shark, [place] 02.01.2024 10:23 AM  Gardiner Sleeper, M.D., Ph.D. Diseases & Surgery of the Retina and Vitreous [facility]  I have reviewed the above documentation for accuracy and completeness, and I agree with the above. Gardiner Sleeper, M.D., Ph.D. 08/17/22 10:23 AM  Abbreviations: M myopia (nearsighted); A astigmatism; H hyperopia (farsighted); P presbyopia; Mrx spectacle prescription;  CTL contact lenses; OD right eye; OS left eye; OU both eyes  XT exotropia; ET esotropia; PEK punctate epithelial keratitis; PEE punctate epithelial erosions; DES dry eye syndrome; MGD meibomian gland dysfunction; ATs artificial tears; PFAT's preservative free artificial tears; [place] nuclear sclerotic cataract; PSC posterior subcapsular cataract; ERM epi-retinal membrane; PVD posterior vitreous detachment; RD retinal detachment; DM diabetes mellitus; DR diabetic retinopathy; NPDR non-proliferative diabetic retinopathy; PDR proliferative diabetic retinopathy; CSME clinically significant macular edema; DME diabetic macular edema; dbh dot blot hemorrhages; CWS cotton wool spot; POAG primary open angle  glaucoma; C/D cup-to-disc ratio; HVF humphrey visual field; GVF goldmann visual field; OCT optical coherence tomography; IOP intraocular pressure; BRVO Branch retinal vein occlusion; CRVO central retinal vein occlusion; CRAO central retinal artery occlusion; BRAO branch retinal artery occlusion; RT retinal tear; SB scleral buckle; PPV pars plana vitrectomy; VH Vitreous hemorrhage; PRP panretinal laser photocoagulation; IVK intravitreal kenalog; VMT vitreomacular traction; MH Macular hole;  NVD neovascularization of the disc; NVE neovascularization elsewhere; AREDS age related eye disease study; ARMD age related macular degeneration; POAG primary open angle glaucoma; EBMD epithelial/anterior basement membrane dystrophy; ACIOL anterior chamber intraocular lens; IOL intraocular lens; PCIOL posterior chamber intraocular lens; Phaco/IOL phacoemulsification with intraocular lens placement; [place] photorefractive keratectomy; LASIK laser assisted in situ keratomileusis; HTN hypertension; DM diabetes mellitus; COPD chronic obstructive pulmonary disease

## 2022-08-17 ENCOUNTER — Ambulatory Visit (INDEPENDENT_AMBULATORY_CARE_PROVIDER_SITE_OTHER): Payer: Medicare PPO | Admitting: Ophthalmology

## 2022-08-17 ENCOUNTER — Encounter (INDEPENDENT_AMBULATORY_CARE_PROVIDER_SITE_OTHER): Payer: Self-pay | Admitting: Ophthalmology

## 2022-08-17 DIAGNOSIS — Z961 Presence of intraocular lens: Secondary | ICD-10-CM | POA: Diagnosis not present

## 2022-08-17 DIAGNOSIS — H35351 Cystoid macular degeneration, right eye: Secondary | ICD-10-CM | POA: Diagnosis not present

## 2022-08-17 DIAGNOSIS — H04123 Dry eye syndrome of bilateral lacrimal glands: Secondary | ICD-10-CM | POA: Diagnosis not present

## 2022-08-17 DIAGNOSIS — E119 Type 2 diabetes mellitus without complications: Secondary | ICD-10-CM

## 2022-08-17 DIAGNOSIS — H33321 Round hole, right eye: Secondary | ICD-10-CM | POA: Diagnosis not present

## 2022-08-17 DIAGNOSIS — H33103 Unspecified retinoschisis, bilateral: Secondary | ICD-10-CM

## 2022-08-17 LAB — HM DIABETES EYE EXAM

## 2022-08-17 MED ORDER — PREDNISOLONE ACETATE 1 % OP SUSP
1.0000 [drp] | Freq: Every day | OPHTHALMIC | 0 refills | Status: DC
Start: 2022-08-17 — End: 2022-10-12

## 2022-08-29 ENCOUNTER — Ambulatory Visit: Payer: Medicare PPO | Admitting: Family Medicine

## 2022-08-29 ENCOUNTER — Other Ambulatory Visit: Payer: Self-pay | Admitting: Family Medicine

## 2022-08-29 DIAGNOSIS — G43909 Migraine, unspecified, not intractable, without status migrainosus: Secondary | ICD-10-CM

## 2022-08-29 NOTE — Telephone Encounter (Signed)
Do not see where you have filled in very long time. Ok to refill

## 2022-08-29 NOTE — Telephone Encounter (Signed)
Sent. Thanks.   

## 2022-09-05 ENCOUNTER — Encounter: Payer: Self-pay | Admitting: Family Medicine

## 2022-09-05 ENCOUNTER — Ambulatory Visit: Payer: Medicare PPO | Admitting: Family Medicine

## 2022-09-05 VITALS — BP 106/64 | HR 79 | Temp 97.1°F | Ht 61.5 in | Wt 175.0 lb

## 2022-09-05 DIAGNOSIS — E119 Type 2 diabetes mellitus without complications: Secondary | ICD-10-CM | POA: Diagnosis not present

## 2022-09-05 LAB — POCT GLYCOSYLATED HEMOGLOBIN (HGB A1C): Hemoglobin A1C: 6.5 % — AB (ref 4.0–5.6)

## 2022-09-05 LAB — MICROALBUMIN / CREATININE URINE RATIO
Creatinine,U: 47.1 mg/dL
Microalb Creat Ratio: 1.5 mg/g (ref 0.0–30.0)
Microalb, Ur: <0.7 mg/dL (ref 0.0–1.9)

## 2022-09-05 MED ORDER — TOPIRAMATE 100 MG PO TABS: 100.0000 mg | ORAL_TABLET | Freq: Every day | ORAL | Status: DC

## 2022-09-05 NOTE — Patient Instructions (Addendum)
Go to the lab on the way out.   If you have mychart we'll likely use that to update you.    Take care.  Glad to see you.  Recheck at a yearly visit in about 6 months with labs ahead of time if possible.

## 2022-09-05 NOTE — Progress Notes (Unsigned)
Diabetes:  Using medications without difficulties:yes Hypoglycemic episodes:no Hyperglycemic episodes:no Feet problems: no Blood Sugars averaging: not checked often, usually 120-130s at baseline.   eye exam within last year: yes, 08/17/22 A1c 6.5.   Statin intolerant.   GI sx better off metformin.   Still on jardiance and ozempic.    Her family is getting together this weekend in [place].  She is looking forward to that.  Meds, vitals, and allergies reviewed.   ROS: Per HPI unless specifically indicated in ROS section   GEN: nad, alert and oriented HEENT: ncat NECK: supple w/o LA CV: rrr. PULM: ctab, no inc wob ABD: soft, +bs EXT: no edema SKIN: no acute rash  Diabetic foot exam: Normal inspection No skin breakdown No calluses  Normal DP pulses Normal sensation to light touch and monofilament Nails normal

## 2022-09-06 NOTE — Assessment & Plan Note (Signed)
Recheck at a yearly visit in about 6 months with labs ahead of time if possible.   See notes on labs today.  Continue Jardiance and Ozempic.  Did not restart metformin.  GI symptoms improved off metformin.

## 2022-09-21 ENCOUNTER — Other Ambulatory Visit: Payer: Self-pay | Admitting: Family Medicine

## 2022-09-29 NOTE — Progress Notes (Addendum)
[facility] Note  10/12/2022     CHIEF COMPLAINT Patient presents for Retina Follow Up   HISTORY OF PRESENT ILLNESS: Natasha Franco is a 72 y.o. female who presents to the clinic today for:   HPI     Retina Follow Up   Patient presents with  Other.  In right eye.  This started 8 weeks ago.  I, the attending physician,  performed the HPI with the patient and updated documentation appropriately.        Comments   Patient here for 8 weeks retina follow up for CME OD. Patient states vision is doing good. No eye pain. Uses pink top drop once a day.      Last edited by Bernarda Caffey, MD on 10/12/2022 12:12 PM.     Pt is still using PF once a day   Referring physician: Luberta Mutter MD [place], [place] [address]  HISTORICAL INFORMATION:   Selected notes from the MEDICAL RECORD NUMBER Referred by Dr. Ellie Lunch for retinoschisis OD LEE:  Ocular Hx- HH plaque X2 OS (2018), cataracts, progressive retinoschisis OD PMH-    CURRENT MEDICATIONS: Current Outpatient Medications (Ophthalmic Drugs)  Medication Sig   ketorolac (ACULAR) 0.5 % ophthalmic solution Place 1 drop into the right eye 4 (four) times daily. (Patient not taking: Reported on 10/12/2022)   prednisoLONE acetate (PRED FORTE) 1 % ophthalmic suspension Place 1 drop into the right eye daily.   No current facility-administered medications for this visit. (Ophthalmic Drugs)   Current Outpatient Medications (Other)  Medication Sig   ALPRAZolam (XANAX) 0.5 MG tablet TAKE 1 TABLET BY MOUTH TWICE DAILY AS NEEDED.   azelastine (ASTELIN) 0.1 % nasal spray Place 2 sprays into both nostrils 2 (two) times daily. Use in each nostril as directed   Biotin 1 MG CAPS Take 1 mg by mouth daily.   Cholecalciferol (VITAMIN D3 PO) Take 2,000 Units by mouth daily at 6 (six) AM.   empagliflozin (JARDIANCE) 25 MG TABS tablet Take 1 tablet (25 mg total) by mouth daily.   escitalopram (LEXAPRO) 20  MG tablet TAKE ONE TABLET BY MOUTH AT BEDTIME   estradiol (ESTRACE VAGINAL) 0.1 MG/GM vaginal cream daily at 6 (six) AM.   Evolocumab (REPATHA SURECLICK) [id number] MG/ML SOAJ INJECT 140MG  UNDER THE SKIN EVERY 14 DAYS   fluticasone (FLONASE) 50 MCG/ACT nasal spray Place 2 sprays into both nostrils 2 (two) times daily.   fluticasone-salmeterol (WIXELA INHUB) 250-50 MCG/ACT AEPB Inhale 1 puff into the lungs in the morning and at bedtime.   ibuprofen (IBU) 800 MG tablet TAKE ONE TABLET BY MOUTH 3 TIMES DAILY WITH FOOD OR MILK AS NEEDED   ipratropium (ATROVENT) 0.06 % nasal spray    levocetirizine (XYZAL) 5 MG tablet Take 5 mg by mouth every evening.   Magnesium 300 MG CAPS Take 1 capsule by mouth daily at 6 (six) AM.   montelukast (SINGULAIR) 10 MG tablet Take 10 mg by mouth at bedtime.   OZEMPIC, 1 MG/DOSE, 4 MG/3ML SOPN INJECT 1 MG AS DIRECTED ONCE A WEEK   pantoprazole (PROTONIX) 40 MG tablet TAKE ONE TABLET TWICE DAILY  TAKE 30 MINUTES BEFORE MEALS   topiramate (TOPAMAX) 100 MG tablet Take 1 tablet (100 mg total) by mouth daily.   traZODone (DESYREL) 50 MG tablet Take 1 tablet by mouth at bedtime.   ursodiol (ACTIGALL) 300 MG capsule Take 1 capsule (300 mg total) by mouth 2 (two) times daily.  No current facility-administered medications for this visit. (Other)   REVIEW OF SYSTEMS: ROS   Positive for: Gastrointestinal, Neurological, Musculoskeletal, Endocrine, Eyes Negative for: Constitutional, Skin, Genitourinary, HENT, Cardiovascular, Respiratory, Psychiatric, Allergic/Imm, Heme/Lymph Last edited by Theodore Demark, COA on 10/12/2022  9:04 AM.      ALLERGIES Allergies  Allergen Reactions   Invokana [Canagliflozin]     VAGINAL YEAST INFECTIONS   Metformin And Related     GI upset   Statins     LFT elevation   Versed [Midazolam]     Talkative after med use   Zetia [Ezetimibe] Cough   Bupropion Hcl Rash   PAST MEDICAL HISTORY Past Medical History:  Diagnosis Date   Arthritis     Barrett's esophagus    Chicken pox    Complication of anesthesia    Diabetes mellitus without complication    Diverticulitis    Family history of polyps in the colon    GERD (gastroesophageal reflux disease)    Hay fever    Hepatitis    Hyperlipidemia    Per pt   Incontinent of urine    Migraines    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   BREAST BIOPSY Left 2014   BREAST SURGERY Bilateral 1984   reduction mammoplasty   CARPAL TUNNEL RELEASE     CESAREAN SECTION     JOINT REPLACEMENT     REDUCTION MAMMAPLASTY Bilateral    ROTATOR CUFF REPAIR     TONSILLECTOMY     TOTAL KNEE ARTHROPLASTY Left 03/25/2019   Procedure: Left Knee Arthroplasty;  Surgeon: Melrose Nakayama, MD;  Location: [facility];  Service: Orthopedics;  Laterality: Left;   VAGINAL HYSTERECTOMY     FAMILY HISTORY Family History  Problem Relation Age of Onset   Arthritis Mother    Early death Father    Diabetes Father    Alcohol abuse Father    COPD Father    Diabetes Mellitus II Father    Heart attack Father    Mental illness Father    Alcohol abuse Sister    Stroke Maternal Grandmother    Stroke Maternal Grandfather    Diabetes Maternal Grandfather    Diabetes Paternal Grandfather    Depression Daughter    Hypotension Daughter        POTS   Colon cancer Neg Hx    Liver cancer Neg Hx    Stomach cancer Neg Hx    Esophageal cancer Neg Hx    Rectal cancer Neg Hx    Breast cancer Neg Hx    SOCIAL HISTORY Social History   Tobacco Use   Smoking status: Former    Packs/day: 1.00    Years: 25.00    Additional pack years: 0.00    Total pack years: 25.00    Types: Cigarettes    Quit date: 09/01/2006    Years since quitting: 16.1   Smokeless tobacco: Never  Vaping Use   Vaping Use: Never used  Substance Use Topics   Alcohol use: Yes    Comment: occ    Drug use: No       OPHTHALMIC EXAM:  Base Eye Exam     Visual Acuity (Snellen - Linear)       Right Left   Dist [place] 20/30 20/20 -2   Dist ph [place]  20/20 -1          Tonometry (Tonopen, 9:01 AM)       Right Left   Pressure 22 09         Pupils       Dark Light Shape React APD   Right 4 3 Round Minimal None   Left 3 2 Round Minimal None         Visual Fields (Counting fingers)       Left Right    Full Full         Extraocular Movement       Right Left    Full, Ortho Full, Ortho         Neuro/Psych     Oriented x3: Yes   Mood/Affect: Normal         Dilation     Both eyes: 1.0% Mydriacyl, 2.5% Phenylephrine @ 9:01 AM           Slit Lamp and Fundus Exam     Slit Lamp Exam       Right Left   Lids/Lashes Dermatochalasis - upper lid Dermatochalasis - upper lid, mild MGD, Telangiectasia   Conjunctiva/Sclera White and quiet, trace STK ST quad, temporal pinguecula White and quiet   Cornea well healed cataract wound, tear film debris 1+ PEE, tear film debris, well healed cataract wound   Anterior Chamber deep and clear, no cell or flare deep and clear, no cell or flare   Iris Round and dilated Round and dilated   Lens PC IOL in good position PC IOL in good position   Anterior Vitreous Vitreous syneresis, +fine pigment, Posterior vitreous detachment, pigmented debris settled inferiory, vitreous condensations Vitreous syneresis, no pigment, Posterior vitreous detachment, vitreous condensations         Fundus Exam       Right Left   Disc Pink and Sharp, mild tilt, inferior PPA Pink and Sharp, tilted, inferior PPA   C/D Ratio 0.5 0.5   Macula Flat, Blunted foveal reflex, stable resolution of central cystic changes, mild RPE mottling greatest centrally, No heme or edema Flat, Blunted foveal reflex, mild RPE mottling, No heme or edema   Vessels mild attenuation, mild AV crossing changes, mild tortuosity attenuated, mild tortuosity   Periphery  Attached, inferior schisis from 0400-0730 with pigment clumping and pigment atrophy within schisis cavity, outer retinal hole at 0700 -- good barricade laser changes posterior to schisis, focal DBH at 1100 midzone; no new RT/RD/lattice or schisis Attached, very shallow schisis IT periphery, mild reticular degeneration           IMAGING AND PROCEDURES  Imaging and Procedures for 10/12/2022  OCT, Retina - OU - Both Eyes       Right Eye Quality was good. Central Foveal Thickness: 281. Progression has been stable. Findings include normal foveal contour, no IRF, no SRF (stable resolution of IRF / central CME, mild interval improvement in central ellipsoid signal IT schisis with outer retinal holes caught on widefield -- not imaged today).   Left Eye Quality was good. Central Foveal Thickness: 286. Progression has been stable. Findings include normal foveal contour, no IRF, no SRF, vitreomacular adhesion (Shallow schisis IT periphery caught on widefield - not imaged today).   Notes *Images captured and stored on drive  Diagnosis / Impression:  OD: stable resolution of IRF / central CME; mild interval improvement in central ellipsoid signal; IT schisis with outer retinal holes caught on widefield -- not imaged today OS: NFP;  no IRF/SRF; Shallow schisis IT periphery caught on widefield -- not imaged today  Clinical management:  See below  Abbreviations: NFP - Normal foveal profile. CME - cystoid macular edema. PED - pigment epithelial detachment. IRF - intraretinal fluid. SRF - subretinal fluid. EZ - ellipsoid zone. ERM - epiretinal membrane. ORA - outer retinal atrophy. ORT - outer retinal tubulation. SRHM - subretinal hyper-reflective material. IRHM - intraretinal hyper-reflective material             ASSESSMENT/PLAN:    ICD-10-CM   1. 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 Retinal hole of right eye   H33.321     4. Diabetes mellitus type 2 without retinopathy  E11.9     5. Pseudophakia, both eyes  Z96.1     6. Dry eyes  H04.123      1. CME OD - development of IRF and sliver of SRF centrally -- consistent with CME, noted on 07.31.23 visit - suspect delayed [place], s/p CEIOL with Dr. Ellie Lunch in March - started on PF and Ketorolac QID OD on 07.31.23 - recurrent CME following taper after 08.22.23 visit - s/p STK OD #1 (11.15.23) - today, OCT shows stable resolution of IRF / central CME, mild interval improvement in central ellipsoid signal - BCVA 20/20 -- improved - dec PF OD to 1 drop every other day - STK informed consent obtained and signed, 11.15.23 (OD)  - f/u 4-6 months, sooner prn -- DFE, OCT  2,3. Retinoschisis OU - OD: inferior schisis 0400-0730 with pigment clumping and pigment atrophy within schisis cavity, outer retinal hole at 0700 - OS: very shallow schisis IT arcades  - pt reports intermittent photopsias and floaters OU -- likely unrelated to schisis - s/p laser retinopexy OD (01.30.23) -- good barricade laser changes posterior to schisis - f/u 4-6 months, DFE, OCT  4. Diabetes mellitus, type 2 without retinopathy  - last A1c was 6.5 on 02.27.24 - The incidence, risk factors for progression, natural history and treatment options for diabetic retinopathy  were discussed with patient.   - The need for close monitoring of blood glucose, blood pressure, and serum lipids, avoiding cigarette or any type of tobacco, and the need for long term follow up was also discussed with patient. - f/u in 1 year, sooner prn  5. Pseudophakia OU  - s/p CE/IOL OU (Dr. Ellie Lunch, March / April 2023)  - IOLs in perfect position, doing well  - mild CME OD as above  - monitor  6. Dry eyes OU (OD > OS) - recommend artificial tears and lubricating ointment as needed   Ophthalmic Meds Ordered this visit:  Meds ordered this encounter  Medications   prednisoLONE acetate (PRED FORTE) 1  % ophthalmic suspension    Sig: Place 1 drop into the right eye daily.    Dispense:  10 mL    Refill:  3     Return for f/u 4-6 months, CME OD, DFE, OCT.  There are no Patient Instructions on file for this visit.   Explained the diagnoses, plan, and follow up with the patient and they expressed understanding.  Patient expressed understanding of the importance of proper follow up care.   This document serves as a record of services personally performed by Gardiner Sleeper, MD, PhD. It was created on their behalf by San Jetty. Owens Shark, OA an ophthalmic technician. The  creation of this record is the provider's dictation and/or activities during the visit.    Electronically signed by: San Jetty. Owens Shark, [place] 03.22.2024 12:14 PM   Gardiner Sleeper, M.D., Ph.D. Diseases & Surgery of the Retina and Vitreous [facility]  I have reviewed the above documentation for accuracy and completeness, and I agree with the above. Gardiner Sleeper, M.D., Ph.D. 10/12/22 12:14 PM   Abbreviations: M myopia (nearsighted); A astigmatism; H hyperopia (farsighted); P presbyopia; Mrx spectacle prescription;  CTL contact lenses; OD right eye; OS left eye; OU both eyes  XT exotropia; ET esotropia; PEK punctate epithelial keratitis; PEE punctate epithelial erosions; DES dry eye syndrome; MGD meibomian gland dysfunction; ATs artificial tears; PFAT's preservative free artificial tears; [place] nuclear sclerotic cataract; PSC posterior subcapsular cataract; ERM epi-retinal membrane; PVD posterior vitreous detachment; RD retinal detachment; DM diabetes mellitus; DR diabetic retinopathy; NPDR non-proliferative diabetic retinopathy; PDR proliferative diabetic retinopathy; CSME clinically significant macular edema; DME diabetic macular edema; dbh dot blot hemorrhages; CWS cotton wool spot; POAG primary open angle glaucoma; C/D cup-to-disc ratio; HVF humphrey visual field; GVF goldmann visual field; OCT optical coherence  tomography; IOP intraocular pressure; BRVO Branch retinal vein occlusion; CRVO central retinal vein occlusion; CRAO central retinal artery occlusion; BRAO branch retinal artery occlusion; RT retinal tear; SB scleral buckle; PPV pars plana vitrectomy; VH Vitreous hemorrhage; PRP panretinal laser photocoagulation; IVK intravitreal kenalog; VMT vitreomacular traction; MH Macular hole;  NVD neovascularization of the disc; NVE neovascularization elsewhere; AREDS age related eye disease study; ARMD age related macular degeneration; POAG primary open angle glaucoma; EBMD epithelial/anterior basement membrane dystrophy; ACIOL anterior chamber intraocular lens; IOL intraocular lens; PCIOL posterior chamber intraocular lens; Phaco/IOL phacoemulsification with intraocular lens placement; [place] photorefractive keratectomy; LASIK laser assisted in situ keratomileusis; HTN hypertension; DM diabetes mellitus; COPD chronic obstructive pulmonary disease

## 2022-10-02 ENCOUNTER — Other Ambulatory Visit: Payer: Self-pay | Admitting: Family Medicine

## 2022-10-02 NOTE — Telephone Encounter (Signed)
Refill request for ALPRAZOLAM 0.5 MG TAB   LOV - 09/05/22 Next OV - not scheduled Last refill - 04/04/22 #60/0

## 2022-10-03 NOTE — Telephone Encounter (Signed)
Sent. Thanks.   

## 2022-10-04 ENCOUNTER — Other Ambulatory Visit: Payer: Self-pay | Admitting: Family Medicine

## 2022-10-04 NOTE — Telephone Encounter (Signed)
Refill request Lexapro Last office visit 09/05/22 Last refill 9/59/23 #90/2

## 2022-10-11 ENCOUNTER — Other Ambulatory Visit: Payer: Self-pay | Admitting: Family Medicine

## 2022-10-12 ENCOUNTER — Ambulatory Visit (INDEPENDENT_AMBULATORY_CARE_PROVIDER_SITE_OTHER): Payer: Medicare PPO | Admitting: Ophthalmology

## 2022-10-12 ENCOUNTER — Encounter (INDEPENDENT_AMBULATORY_CARE_PROVIDER_SITE_OTHER): Payer: Self-pay | Admitting: Ophthalmology

## 2022-10-12 DIAGNOSIS — Z961 Presence of intraocular lens: Secondary | ICD-10-CM

## 2022-10-12 DIAGNOSIS — E119 Type 2 diabetes mellitus without complications: Secondary | ICD-10-CM

## 2022-10-12 DIAGNOSIS — H33321 Round hole, right eye: Secondary | ICD-10-CM | POA: Diagnosis not present

## 2022-10-12 DIAGNOSIS — H35351 Cystoid macular degeneration, right eye: Secondary | ICD-10-CM | POA: Diagnosis not present

## 2022-10-12 DIAGNOSIS — H33103 Unspecified retinoschisis, bilateral: Secondary | ICD-10-CM | POA: Diagnosis not present

## 2022-10-12 DIAGNOSIS — H04123 Dry eye syndrome of bilateral lacrimal glands: Secondary | ICD-10-CM

## 2022-10-12 MED ORDER — PREDNISOLONE ACETATE 1 % OP SUSP: 1.0000 [drp] | Freq: Every day | OPHTHALMIC | 3 refills | Status: DC

## 2022-10-12 NOTE — Progress Notes (Signed)
[facility] Note  10/12/2022     CHIEF COMPLAINT Patient presents for Retina Follow Up   HISTORY OF PRESENT ILLNESS: Natasha Franco is a 72 y.o. female who presents to the clinic today for:   HPI     Retina Follow Up   Patient presents with  Other.  In right eye.  This started 8 weeks ago.  I, the attending physician,  performed the HPI with the patient and updated documentation appropriately.        Comments   Patient here for 8 weeks retina follow up for CME OD. Patient states vision is doing good. No eye pain. Uses pink top drop once a day.      Last edited by Bernarda Caffey, MD on 10/12/2022 12:12 PM.     Pt is still using PF once a day   Referring physician: Luberta Mutter MD [place], [place] [address]  HISTORICAL INFORMATION:   Selected notes from the MEDICAL RECORD NUMBER Referred by Dr. Ellie Lunch for retinoschisis OD LEE:  Ocular Hx- HH plaque X2 OS (2018), cataracts, progressive retinoschisis OD PMH-    CURRENT MEDICATIONS: Current Outpatient Medications (Ophthalmic Drugs)  Medication Sig   ketorolac (ACULAR) 0.5 % ophthalmic solution Place 1 drop into the right eye 4 (four) times daily. (Patient not taking: Reported on 10/12/2022)   prednisoLONE acetate (PRED FORTE) 1 % ophthalmic suspension Place 1 drop into the right eye daily.   No current facility-administered medications for this visit. (Ophthalmic Drugs)   Current Outpatient Medications (Other)  Medication Sig   ALPRAZolam (XANAX) 0.5 MG tablet TAKE 1 TABLET BY MOUTH TWICE DAILY AS NEEDED.   azelastine (ASTELIN) 0.1 % nasal spray Place 2 sprays into both nostrils 2 (two) times daily. Use in each nostril as directed   Biotin 1 MG CAPS Take 1 mg by mouth daily.   Cholecalciferol (VITAMIN D3 PO) Take 2,000 Units by mouth daily at 6 (six) AM.   empagliflozin (JARDIANCE) 25 MG TABS tablet Take 1 tablet (25 mg total) by mouth daily.   escitalopram (LEXAPRO) 20  MG tablet TAKE ONE TABLET BY MOUTH AT BEDTIME   estradiol (ESTRACE VAGINAL) 0.1 MG/GM vaginal cream daily at 6 (six) AM.   Evolocumab (REPATHA SURECLICK) [id number] MG/ML SOAJ INJECT 140MG  UNDER THE SKIN EVERY 14 DAYS   fluticasone (FLONASE) 50 MCG/ACT nasal spray Place 2 sprays into both nostrils 2 (two) times daily.   fluticasone-salmeterol (WIXELA INHUB) 250-50 MCG/ACT AEPB Inhale 1 puff into the lungs in the morning and at bedtime.   ibuprofen (IBU) 800 MG tablet TAKE ONE TABLET BY MOUTH 3 TIMES DAILY WITH FOOD OR MILK AS NEEDED   ipratropium (ATROVENT) 0.06 % nasal spray    levocetirizine (XYZAL) 5 MG tablet Take 5 mg by mouth every evening.   Magnesium 300 MG CAPS Take 1 capsule by mouth daily at 6 (six) AM.   montelukast (SINGULAIR) 10 MG tablet Take 10 mg by mouth at bedtime.   OZEMPIC, 1 MG/DOSE, 4 MG/3ML SOPN INJECT 1 MG AS DIRECTED ONCE A WEEK   pantoprazole (PROTONIX) 40 MG tablet TAKE ONE TABLET TWICE DAILY  TAKE 30 MINUTES BEFORE MEALS   topiramate (TOPAMAX) 100 MG tablet Take 1 tablet (100 mg total) by mouth daily.   traZODone (DESYREL) 50 MG tablet Take 1 tablet by mouth at bedtime.   ursodiol (ACTIGALL) 300 MG capsule Take 1 capsule (300 mg total) by mouth 2 (two) times daily.  No current facility-administered medications for this visit. (Other)   REVIEW OF SYSTEMS: ROS   Positive for: Gastrointestinal, Neurological, Musculoskeletal, Endocrine, Eyes Negative for: Constitutional, Skin, Genitourinary, HENT, Cardiovascular, Respiratory, Psychiatric, Allergic/Imm, Heme/Lymph Last edited by Theodore Demark, COA on 10/12/2022  9:04 AM.      ALLERGIES Allergies  Allergen Reactions   Invokana [Canagliflozin]     VAGINAL YEAST INFECTIONS   Metformin And Related     GI upset   Statins     LFT elevation   Versed [Midazolam]     Talkative after med use   Zetia [Ezetimibe] Cough   Bupropion Hcl Rash   PAST MEDICAL HISTORY Past Medical History:  Diagnosis Date   Arthritis     Barrett's esophagus    Chicken pox    Complication of anesthesia    Diabetes mellitus without complication    Diverticulitis    Family history of polyps in the colon    GERD (gastroesophageal reflux disease)    Hay fever    Hepatitis    Hyperlipidemia    Per pt   Incontinent of urine    Migraines    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   BREAST BIOPSY Left 2014   BREAST SURGERY Bilateral 1984   reduction mammoplasty   CARPAL TUNNEL RELEASE     CESAREAN SECTION     JOINT REPLACEMENT     REDUCTION MAMMAPLASTY Bilateral    ROTATOR CUFF REPAIR     TONSILLECTOMY     TOTAL KNEE ARTHROPLASTY Left 03/25/2019   Procedure: Left Knee Arthroplasty;  Surgeon: Melrose Nakayama, MD;  Location: [facility];  Service: Orthopedics;  Laterality: Left;   VAGINAL HYSTERECTOMY     FAMILY HISTORY Family History  Problem Relation Age of Onset   Arthritis Mother    Early death Father    Diabetes Father    Alcohol abuse Father    COPD Father    Diabetes Mellitus II Father    Heart attack Father    Mental illness Father    Alcohol abuse Sister    Stroke Maternal Grandmother    Stroke Maternal Grandfather    Diabetes Maternal Grandfather    Diabetes Paternal Grandfather    Depression Daughter    Hypotension Daughter        POTS   Colon cancer Neg Hx    Liver cancer Neg Hx    Stomach cancer Neg Hx    Esophageal cancer Neg Hx    Rectal cancer Neg Hx    Breast cancer Neg Hx    SOCIAL HISTORY Social History   Tobacco Use   Smoking status: Former    Packs/day: 1.00    Years: 25.00    Additional pack years: 0.00    Total pack years: 25.00    Types: Cigarettes    Quit date: 09/01/2006    Years since quitting: 16.1   Smokeless tobacco: Never  Vaping Use   Vaping Use: Never used  Substance Use Topics   Alcohol use: Yes    Comment: occ    Drug use: No       OPHTHALMIC EXAM:  Base Eye Exam     Visual Acuity (Snellen - Linear)       Right Left   Dist [place] 20/30 20/20 -2   Dist ph [place]  20/20 -1          Tonometry (Tonopen, 9:01 AM)       Right Left   Pressure 22 09         Pupils       Dark Light Shape React APD   Right 4 3 Round Minimal None   Left 3 2 Round Minimal None         Visual Fields (Counting fingers)       Left Right    Full Full         Extraocular Movement       Right Left    Full, Ortho Full, Ortho         Neuro/Psych     Oriented x3: Yes   Mood/Affect: Normal         Dilation     Both eyes: 1.0% Mydriacyl, 2.5% Phenylephrine @ 9:01 AM           Slit Lamp and Fundus Exam     Slit Lamp Exam       Right Left   Lids/Lashes Dermatochalasis - upper lid Dermatochalasis - upper lid, mild MGD, Telangiectasia   Conjunctiva/Sclera White and quiet, trace STK ST quad, temporal pinguecula White and quiet   Cornea well healed cataract wound, tear film debris 1+ PEE, tear film debris, well healed cataract wound   Anterior Chamber deep and clear, no cell or flare deep and clear, no cell or flare   Iris Round and dilated Round and dilated   Lens PC IOL in good position PC IOL in good position   Anterior Vitreous Vitreous syneresis, +fine pigment, Posterior vitreous detachment, pigmented debris settled inferiory, vitreous condensations Vitreous syneresis, no pigment, Posterior vitreous detachment, vitreous condensations         Fundus Exam       Right Left   Disc Pink and Sharp, mild tilt, inferior PPA Pink and Sharp, tilted, inferior PPA   C/D Ratio 0.5 0.5   Macula Flat, Blunted foveal reflex, stable resolution of central cystic changes, mild RPE mottling greatest centrally, No heme or edema Flat, Blunted foveal reflex, mild RPE mottling, No heme or edema   Vessels mild attenuation, mild AV crossing changes, mild tortuosity attenuated, mild tortuosity   Periphery  Attached, inferior schisis from 0400-0730 with pigment clumping and pigment atrophy within schisis cavity, outer retinal hole at 0700 -- good barricade laser changes posterior to schisis, focal DBH at 1100 midzone; no new RT/RD/lattice or schisis Attached, very shallow schisis IT periphery, mild reticular degeneration           IMAGING AND PROCEDURES  Imaging and Procedures for 10/12/2022  OCT, Retina - OU - Both Eyes       Right Eye Quality was good. Central Foveal Thickness: 281. Progression has been stable. Findings include normal foveal contour, no IRF, no SRF (stable resolution of IRF / central CME, mild interval improvement in central ellipsoid signal IT schisis with outer retinal holes caught on widefield -- not imaged today).   Left Eye Quality was good. Central Foveal Thickness: 286. Progression has been stable. Findings include normal foveal contour, no IRF, no SRF, vitreomacular adhesion (Shallow schisis IT periphery caught on widefield - not imaged today).   Notes *Images captured and stored on drive  Diagnosis / Impression:  OD: stable resolution of IRF / central CME; mild interval improvement in central ellipsoid signal; IT schisis with outer retinal holes caught on widefield -- not imaged today OS: NFP;  no IRF/SRF; Shallow schisis IT periphery caught on widefield -- not imaged today  Clinical management:  See below  Abbreviations: NFP - Normal foveal profile. CME - cystoid macular edema. PED - pigment epithelial detachment. IRF - intraretinal fluid. SRF - subretinal fluid. EZ - ellipsoid zone. ERM - epiretinal membrane. ORA - outer retinal atrophy. ORT - outer retinal tubulation. SRHM - subretinal hyper-reflective material. IRHM - intraretinal hyper-reflective material             ASSESSMENT/PLAN:    ICD-10-CM   1. 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 Retinal hole of right eye   H33.321     4. Diabetes mellitus type 2 without retinopathy  E11.9     5. Pseudophakia, both eyes  Z96.1     6. Dry eyes  H04.123      1. CME OD - development of IRF and sliver of SRF centrally -- consistent with CME, noted on 07.31.23 visit - suspect delayed [place], s/p CEIOL with Dr. Ellie Lunch in March - started on PF and Ketorolac QID OD on 07.31.23 - recurrent CME following taper after 08.22.23 visit - s/p STK OD #1 (11.15.23) - today, OCT shows stable resolution of IRF / central CME, mild interval improvement in central ellipsoid signal - BCVA 20/20 -- improved - dec PF OD to 1 drop every other day - STK informed consent obtained and signed, 11.15.23 (OD)  - f/u 4-6 months, sooner prn -- DFE, OCT  2,3. Retinoschisis OU - OD: inferior schisis 0400-0730 with pigment clumping and pigment atrophy within schisis cavity, outer retinal hole at 0700 - OS: very shallow schisis IT arcades  - pt reports intermittent photopsias and floaters OU -- likely unrelated to schisis - s/p laser retinopexy OD (01.30.23) -- good barricade laser changes posterior to schisis - f/u 4-6 months, DFE, OCT  4. Diabetes mellitus, type 2 without retinopathy  - last A1c was 6.5 on 02.27.24 - The incidence, risk factors for progression, natural history and treatment options for diabetic retinopathy  were discussed with patient.   - The need for close monitoring of blood glucose, blood pressure, and serum lipids, avoiding cigarette or any type of tobacco, and the need for long term follow up was also discussed with patient. - f/u in 1 year, sooner prn  5. Pseudophakia OU  - s/p CE/IOL OU (Dr. Ellie Lunch, March / April 2023)  - IOLs in perfect position, doing well  - mild CME OD as above  - monitor  6. Dry eyes OU (OD > OS) - recommend artificial tears and lubricating ointment as needed   Ophthalmic Meds Ordered this visit:  Meds ordered this encounter  Medications   prednisoLONE acetate (PRED FORTE) 1  % ophthalmic suspension    Sig: Place 1 drop into the right eye daily.    Dispense:  10 mL    Refill:  3     Return for f/u 4-6 months, CME OD, DFE, OCT.  There are no Patient Instructions on file for this visit.   Explained the diagnoses, plan, and follow up with the patient and they expressed understanding.  Patient expressed understanding of the importance of proper follow up care.   This document serves as a record of services personally performed by Gardiner Sleeper, MD, PhD. It was created on their behalf by San Jetty. Owens Shark, OA an ophthalmic technician. The  creation of this record is the provider's dictation and/or activities during the visit.    Electronically signed by: San Jetty. Owens Shark, [place] 03.22.2024 12:14 PM   Gardiner Sleeper, M.D., Ph.D. Diseases & Surgery of the Retina and Vitreous [facility]  I have reviewed the above documentation for accuracy and completeness, and I agree with the above. Gardiner Sleeper, M.D., Ph.D. 10/12/22 12:14 PM   Abbreviations: M myopia (nearsighted); A astigmatism; H hyperopia (farsighted); P presbyopia; Mrx spectacle prescription;  CTL contact lenses; OD right eye; OS left eye; OU both eyes  XT exotropia; ET esotropia; PEK punctate epithelial keratitis; PEE punctate epithelial erosions; DES dry eye syndrome; MGD meibomian gland dysfunction; ATs artificial tears; PFAT's preservative free artificial tears; [place] nuclear sclerotic cataract; PSC posterior subcapsular cataract; ERM epi-retinal membrane; PVD posterior vitreous detachment; RD retinal detachment; DM diabetes mellitus; DR diabetic retinopathy; NPDR non-proliferative diabetic retinopathy; PDR proliferative diabetic retinopathy; CSME clinically significant macular edema; DME diabetic macular edema; dbh dot blot hemorrhages; CWS cotton wool spot; POAG primary open angle glaucoma; C/D cup-to-disc ratio; HVF humphrey visual field; GVF goldmann visual field; OCT optical coherence  tomography; IOP intraocular pressure; BRVO Branch retinal vein occlusion; CRVO central retinal vein occlusion; CRAO central retinal artery occlusion; BRAO branch retinal artery occlusion; RT retinal tear; SB scleral buckle; PPV pars plana vitrectomy; VH Vitreous hemorrhage; PRP panretinal laser photocoagulation; IVK intravitreal kenalog; VMT vitreomacular traction; MH Macular hole;  NVD neovascularization of the disc; NVE neovascularization elsewhere; AREDS age related eye disease study; ARMD age related macular degeneration; POAG primary open angle glaucoma; EBMD epithelial/anterior basement membrane dystrophy; ACIOL anterior chamber intraocular lens; IOL intraocular lens; PCIOL posterior chamber intraocular lens; Phaco/IOL phacoemulsification with intraocular lens placement; [place] photorefractive keratectomy; LASIK laser assisted in situ keratomileusis; HTN hypertension; DM diabetes mellitus; COPD chronic obstructive pulmonary disease

## 2022-10-19 ENCOUNTER — Other Ambulatory Visit: Payer: Self-pay | Admitting: Family Medicine

## 2022-10-24 ENCOUNTER — Ambulatory Visit: Payer: Medicare PPO | Admitting: Family Medicine

## 2022-10-24 ENCOUNTER — Encounter: Payer: Self-pay | Admitting: Family Medicine

## 2022-10-24 VITALS — BP 106/82 | HR 75 | Temp 98.1°F | Ht 61.5 in | Wt 176.0 lb

## 2022-10-24 DIAGNOSIS — R1110 Vomiting, unspecified: Secondary | ICD-10-CM

## 2022-10-24 DIAGNOSIS — E119 Type 2 diabetes mellitus without complications: Secondary | ICD-10-CM

## 2022-10-24 LAB — LIPASE: Lipase: 23 U/L (ref 11.0–59.0)

## 2022-10-24 LAB — HEMOGLOBIN A1C: Hgb A1c MFr Bld: 6.7 % — ABNORMAL HIGH (ref 4.6–6.5)

## 2022-10-24 LAB — COMPREHENSIVE METABOLIC PANEL
ALT: 41 U/L — ABNORMAL HIGH (ref 0–35)
AST: 26 U/L (ref 0–37)
Albumin: 4 g/dL (ref 3.5–5.2)
Alkaline Phosphatase: 163 U/L — ABNORMAL HIGH (ref 39–117)
BUN: 14 mg/dL (ref 6–23)
CO2: 26 mEq/L (ref 19–32)
Calcium: 9.1 mg/dL (ref 8.4–10.5)
Chloride: 103 mEq/L (ref 96–112)
Creatinine, Ser: 0.67 mg/dL (ref 0.40–1.20)
GFR: 88.62 mL/min (ref >60.00)
Glucose, Bld: 101 mg/dL — ABNORMAL HIGH (ref 70–99)
Potassium: 4.4 mEq/L (ref 3.5–5.1)
Sodium: 137 mEq/L (ref 135–145)
Total Bilirubin: 0.6 mg/dL (ref 0.2–1.2)
Total Protein: 7 g/dL (ref 6.0–8.3)

## 2022-10-24 LAB — CBC WITH DIFFERENTIAL/PLATELET
Basophils Absolute: 0.1 10*3/uL (ref 0.0–0.1)
Basophils Relative: 0.7 % (ref 0.0–3.0)
Eosinophils Absolute: 0.2 10*3/uL (ref 0.0–0.7)
Eosinophils Relative: 2.6 % (ref 0.0–5.0)
HCT: 44.8 % (ref 36.0–46.0)
Hemoglobin: 14.7 g/dL (ref 12.0–15.0)
Lymphocytes Relative: 32.6 % (ref 12.0–46.0)
Lymphs Abs: 2.6 10*3/uL (ref 0.7–4.0)
MCHC: 32.9 g/dL (ref 30.0–36.0)
MCV: 86.5 fl (ref 78.0–100.0)
Monocytes Absolute: 0.7 10*3/uL (ref 0.1–1.0)
Monocytes Relative: 8.4 % (ref 3.0–12.0)
Neutro Abs: 4.4 10*3/uL (ref 1.4–7.7)
Neutrophils Relative %: 55.7 % (ref 43.0–77.0)
Platelets: 340 10*3/uL (ref 150.0–400.0)
RBC: 5.18 Mil/uL — ABNORMAL HIGH (ref 3.87–5.11)
RDW: 14.2 % (ref 11.5–15.5)
WBC: 7.9 10*3/uL (ref 4.0–10.5)

## 2022-10-24 LAB — TSH: TSH: 1.13 u[IU]/mL (ref 0.35–5.50)

## 2022-10-24 NOTE — Progress Notes (Unsigned)
Vomiting.  Episodic.  Still on ozempic.  No other clear trigger.  Not daily but recurrent (~every other month or so).  This is atypical for patient.   No blood in vomitus. No black stools.  She has prodromal sx. Inc in gas and burping.  Occ diarrhea.  Not losing weight but some bloating.    Rare ibuprofen use.  Dealing with more joint pain.  On repatha.  Unclear source.    Fatigue noted.  That is gradually getting worse.    Meds, vitals, and allergies reviewed.   ROS: Per HPI unless specifically indicated in ROS section   GEN: nad, alert and oriented HEENT: ncat NECK: supple w/o LA CV: rrr PULM: ctab, no inc wob ABD: soft, +bs EXT: no edema SKIN: no acute rash

## 2022-10-24 NOTE — Patient Instructions (Signed)
Please call GI about follow up.   Go to the lab on the way out.   If you have mychart we'll likely use that to update you.    Take care.  Glad to see you. Stop ozempic in the meantime.  You may need to stop repatha if the pain isn't better.   I would only make one change at a time.   Take care.  Glad to see you.

## 2022-10-25 DIAGNOSIS — R1110 Vomiting, unspecified: Secondary | ICD-10-CM | POA: Insufficient documentation

## 2022-10-25 NOTE — Assessment & Plan Note (Signed)
Could be from Ozempic.  See notes on labs.  Stop Ozempic in the meantime.  She could also be having muscle aches related to Repatha.  I think it makes sense to make 1 change at a time.  She agrees to plan.  Benign abdominal exam.  Okay for outpatient follow-up.

## 2022-11-01 DIAGNOSIS — H52203 Unspecified astigmatism, bilateral: Secondary | ICD-10-CM | POA: Diagnosis not present

## 2022-11-01 DIAGNOSIS — E119 Type 2 diabetes mellitus without complications: Secondary | ICD-10-CM | POA: Diagnosis not present

## 2022-11-01 DIAGNOSIS — H40011 Open angle with borderline findings, low risk, right eye: Secondary | ICD-10-CM | POA: Diagnosis not present

## 2022-11-01 DIAGNOSIS — Z961 Presence of intraocular lens: Secondary | ICD-10-CM | POA: Diagnosis not present

## 2022-11-01 DIAGNOSIS — H40051 Ocular hypertension, right eye: Secondary | ICD-10-CM | POA: Diagnosis not present

## 2022-11-14 ENCOUNTER — Telehealth: Payer: Self-pay | Admitting: Internal Medicine

## 2022-11-14 ENCOUNTER — Other Ambulatory Visit: Payer: Self-pay | Admitting: Pharmacist

## 2022-11-14 DIAGNOSIS — E782 Mixed hyperlipidemia: Secondary | ICD-10-CM

## 2022-11-14 MED ORDER — REPATHA SURECLICK 140 MG/ML ~~LOC~~ SOAJ
SUBCUTANEOUS | 3 refills | Status: DC
Start: 2022-11-14 — End: 2022-11-15

## 2022-11-14 NOTE — Telephone Encounter (Signed)
*  STAT* If patient is at the pharmacy, call can be transferred to refill team.   1. Which medications need to be refilled? (please list name of each medication and dose if known)   Evolocumab (REPATHA SURECLICK) 140 MG/ML SOAJ    2. Which pharmacy/location (including street and city if local pharmacy) is medication to be sent to? [id number] - [place], [place] - [address] OF [address]   3. Do they need a 30 day or 90 day supply? 90  Patient has appt on 6/19

## 2022-11-15 MED ORDER — REPATHA SURECLICK 140 MG/ML ~~LOC~~ SOAJ
SUBCUTANEOUS | 3 refills | Status: DC
Start: 2022-11-15 — End: 2023-04-17

## 2022-11-15 NOTE — Telephone Encounter (Signed)
Refill sent in

## 2022-11-29 ENCOUNTER — Other Ambulatory Visit: Payer: Self-pay | Admitting: Family Medicine

## 2022-11-29 DIAGNOSIS — E119 Type 2 diabetes mellitus without complications: Secondary | ICD-10-CM | POA: Diagnosis not present

## 2022-11-29 DIAGNOSIS — H40051 Ocular hypertension, right eye: Secondary | ICD-10-CM | POA: Diagnosis not present

## 2022-11-29 DIAGNOSIS — H40011 Open angle with borderline findings, low risk, right eye: Secondary | ICD-10-CM | POA: Diagnosis not present

## 2022-12-06 DIAGNOSIS — H40051 Ocular hypertension, right eye: Secondary | ICD-10-CM | POA: Diagnosis not present

## 2022-12-06 DIAGNOSIS — H40021 Open angle with borderline findings, high risk, right eye: Secondary | ICD-10-CM | POA: Diagnosis not present

## 2022-12-22 ENCOUNTER — Other Ambulatory Visit
Admission: RE | Admit: 2022-12-22 | Discharge: 2022-12-22 | Disposition: A | Discharge status: Home / Self Care | Payer: Medicare PPO | Attending: Plastic Surgery | Admitting: Plastic Surgery

## 2022-12-22 DIAGNOSIS — E119 Type 2 diabetes mellitus without complications: Secondary | ICD-10-CM | POA: Insufficient documentation

## 2022-12-22 DIAGNOSIS — Z01812 Encounter for preprocedural laboratory examination: Secondary | ICD-10-CM | POA: Insufficient documentation

## 2022-12-22 DIAGNOSIS — R1110 Vomiting, unspecified: Secondary | ICD-10-CM | POA: Diagnosis not present

## 2022-12-22 LAB — HEMOGLOBIN A1C
Hgb A1c MFr Bld: 7.7 % — ABNORMAL HIGH (ref 4.8–5.6)
Mean Plasma Glucose: 174.29 mg/dL

## 2022-12-22 LAB — CBC WITH DIFFERENTIAL/PLATELET
Abs Immature Granulocytes: 0.02 10*3/uL (ref 0.00–0.07)
Basophils Absolute: 0.1 10*3/uL (ref 0.0–0.1)
Basophils Relative: 1 %
Eosinophils Absolute: 0.3 10*3/uL (ref 0.0–0.5)
Eosinophils Relative: 4 %
HCT: 44.3 % (ref 36.0–46.0)
Hemoglobin: 14.4 g/dL (ref 12.0–15.0)
Immature Granulocytes: 0 %
Lymphocytes Relative: 36 %
Lymphs Abs: 2.3 10*3/uL (ref 0.7–4.0)
MCH: 27.6 pg (ref 26.0–34.0)
MCHC: 32.5 g/dL (ref 30.0–36.0)
MCV: 85 fL (ref 80.0–100.0)
Monocytes Absolute: 0.5 10*3/uL (ref 0.1–1.0)
Monocytes Relative: 7 %
Neutro Abs: 3.4 10*3/uL (ref 1.7–7.7)
Neutrophils Relative %: 52 %
Platelets: 285 10*3/uL (ref 150–400)
RBC: 5.21 MIL/uL — ABNORMAL HIGH (ref 3.87–5.11)
RDW: 14.6 % (ref 11.5–15.5)
WBC: 6.5 10*3/uL (ref 4.0–10.5)
nRBC: 0 % (ref 0.0–0.2)

## 2022-12-22 LAB — COMPREHENSIVE METABOLIC PANEL
ALT: 32 U/L (ref 0–44)
AST: 27 U/L (ref 15–41)
Albumin: 3.8 g/dL (ref 3.5–5.0)
Alkaline Phosphatase: 129 U/L — ABNORMAL HIGH (ref 38–126)
Anion gap: 11 (ref 5–15)
BUN: 21 mg/dL (ref 8–23)
CO2: 19 mmol/L — ABNORMAL LOW (ref 22–32)
Calcium: 8.9 mg/dL (ref 8.9–10.3)
Chloride: 109 mmol/L (ref 98–111)
Creatinine, Ser: 0.75 mg/dL (ref 0.44–1.00)
GFR, Estimated: >60 mL/min (ref >60)
Glucose, Bld: 194 mg/dL — ABNORMAL HIGH (ref 70–99)
Potassium: 3.9 mmol/L (ref 3.5–5.1)
Sodium: 139 mmol/L (ref 135–145)
Total Bilirubin: 0.6 mg/dL (ref 0.3–1.2)
Total Protein: 7.5 g/dL (ref 6.5–8.1)

## 2022-12-22 LAB — PROTIME-INR
INR: 1 (ref 0.8–1.2)
Prothrombin Time: 13.5 seconds (ref 11.4–15.2)

## 2022-12-22 LAB — APTT: aPTT: 29 seconds (ref 24–36)

## 2022-12-26 ENCOUNTER — Encounter: Payer: Self-pay | Admitting: Family Medicine

## 2022-12-26 ENCOUNTER — Ambulatory Visit: Payer: Medicare PPO | Admitting: Family Medicine

## 2022-12-26 VITALS — BP 120/84 | HR 86 | Temp 98.4°F | Ht 61.5 in | Wt 182.0 lb

## 2022-12-26 DIAGNOSIS — Z7985 Long-term (current) use of injectable non-insulin antidiabetic drugs: Secondary | ICD-10-CM

## 2022-12-26 DIAGNOSIS — Z01818 Encounter for other preprocedural examination: Secondary | ICD-10-CM | POA: Diagnosis not present

## 2022-12-26 DIAGNOSIS — E119 Type 2 diabetes mellitus without complications: Secondary | ICD-10-CM

## 2022-12-26 DIAGNOSIS — H5702 Anisocoria: Secondary | ICD-10-CM | POA: Diagnosis not present

## 2022-12-26 MED ORDER — TRULICITY 0.75 MG/0.5ML ~~LOC~~ SOAJ
0.7500 mg | SUBCUTANEOUS | 3 refills | Status: DC
Start: 2022-12-26 — End: 2023-03-29

## 2022-12-26 NOTE — Progress Notes (Unsigned)
Pre-op eval.  Has cards eval pending tomorrow.  A1c  7.7.  Plan for facial cosmetic surgery.  Eyelid droop is affecting her vision.    She prev tolerated trulicity w/o ADE on med.  She was off med due to availability.   No CP.  Not SOB.  No h/o PE/DVT.  No h/o CVA/MI.  Has tolerated anesthesia prior.   D/w pt about risk/clearance.    Her pupils appear symmetric today.  She has eye clinic f/u pending.  PERRL and EOMI today.    Meds, vitals, and allergies reviewed.   ROS: Per HPI unless specifically indicated in ROS section   Assuming no issue with cardiology then would be low risk for surgery.  Can get EKG done at cards OV tomorrow.     Dr. Merry Proud with plastic surgery

## 2022-12-26 NOTE — Patient Instructions (Addendum)
Ask the eye clinic about your pupil size at follow up and please have them send me a note.   Take care.  Glad to see you. Check to see what sugar meter is covered and let us know.   Let me know if you can't get trulicity filled.  Would start with 0.75mg  weekly.   Plan on recheck A1c at a visit in about 3 months.

## 2022-12-27 ENCOUNTER — Ambulatory Visit: Payer: Medicare PPO | Attending: Internal Medicine | Admitting: Internal Medicine

## 2022-12-27 ENCOUNTER — Encounter: Payer: Self-pay | Admitting: Internal Medicine

## 2022-12-27 VITALS — BP 100/60 | HR 89 | Ht 62.0 in | Wt 181.2 lb

## 2022-12-27 DIAGNOSIS — K743 Primary biliary cirrhosis: Secondary | ICD-10-CM

## 2022-12-27 DIAGNOSIS — N181 Chronic kidney disease, stage 1: Secondary | ICD-10-CM

## 2022-12-27 DIAGNOSIS — E1122 Type 2 diabetes mellitus with diabetic chronic kidney disease: Secondary | ICD-10-CM

## 2022-12-27 DIAGNOSIS — E782 Mixed hyperlipidemia: Secondary | ICD-10-CM

## 2022-12-27 DIAGNOSIS — Z7984 Long term (current) use of oral hypoglycemic drugs: Secondary | ICD-10-CM | POA: Diagnosis not present

## 2022-12-27 DIAGNOSIS — I700 Atherosclerosis of aorta: Secondary | ICD-10-CM | POA: Diagnosis not present

## 2022-12-27 NOTE — Progress Notes (Signed)
Cardiology Office Note:    Date:  12/27/2022   ID:  Natasha Franco, DOB 12/20/1950, MRN [id number]  PCP:  Joaquim Nam, MD  Cardiologist:  None   Referring MD: Joaquim Nam, MD   No chief complaint on file.   History of Present Illness:    Natasha Franco is a 72 y.o. female with a hx of diabetes mellitus II, aortic atherosclerosis, prior smoking history, hypertension, hyperlipidemia, and recurring atypical chest discomfort that awaken her from sleep. I also care for her husband Makenzii Waldrum.  Both are former patients of Dr. Nadara Eaton map.  Feeling well overall.  Anticipating plastic surgery in July with Dr. Arita Miss.  Stable from a cardiovascular standpoint.  No chest pain or shortness of breath.  Continues on Repatha, due for repeat lipids but excellent lipid control overall on labs last year.  Brief interruption in Trulicity due to availability, transition to Ozempic and did not tolerate well, transitioned back recently to Trulicity, explaining the mild elevation in A1c over the last year.  The patient denies chest pain, chest pressure, dyspnea at rest or with exertion, palpitations, PND, orthopnea, or leg swelling. Denies cough, fever, chills. Denies nausea, vomiting. Denies syncope or presyncope. Denies dizziness or lightheadedness.   Past Medical History:  Diagnosis Date   Arthritis    Barrett's esophagus    Chicken pox    Complication of anesthesia    Diabetes mellitus without complication (HCC)    Diverticulitis    Family history of polyps in the colon    GERD (gastroesophageal reflux disease)    Hay fever    Hepatitis    Hyperlipidemia    Per pt   Incontinent of urine    Migraines    Primary biliary cirrhosis (HCC)    suspected (AMA (+) but biopsy negative 2014)    Past Surgical History:  Procedure Laterality Date   BLADDER SUSPENSION     with mesh erosion in vaginal vault.   BREAST BIOPSY Left 2014   BREAST SURGERY Bilateral 1984   reduction mammoplasty   CARPAL  TUNNEL RELEASE     CESAREAN SECTION     JOINT REPLACEMENT     REDUCTION MAMMAPLASTY Bilateral    ROTATOR CUFF REPAIR     TONSILLECTOMY     TOTAL KNEE ARTHROPLASTY Left 03/25/2019   Procedure: Left Knee Arthroplasty;  Surgeon: Marcene Corning, MD;  Location: [facility];  Service: Orthopedics;  Laterality: Left;   VAGINAL HYSTERECTOMY      Current Medications: Current Meds  Medication Sig   ALPRAZolam (XANAX) 0.5 MG tablet TAKE 1 TABLET BY MOUTH TWICE DAILY AS NEEDED.   azelastine (ASTELIN) 0.1 % nasal spray Place 2 sprays into both nostrils 2 (two) times daily. Use in each nostril as directed   B Complex Vitamins (VITAMIN B COMPLEX PO) Take by mouth.   Biotin 1 MG CAPS Take 1 mg by mouth daily.   Dulaglutide (TRULICITY) 0.75 MG/0.5ML SOPN Inject 0.75 mg into the skin once a week.   empagliflozin (JARDIANCE) 25 MG TABS tablet Take 1 tablet (25 mg total) by mouth daily.   escitalopram (LEXAPRO) 20 MG tablet TAKE ONE TABLET BY MOUTH AT BEDTIME   estradiol (ESTRACE VAGINAL) 0.1 MG/GM vaginal cream daily at 6 (six) AM.   Evolocumab (REPATHA SURECLICK) 140 MG/ML SOAJ INJECT 140MG  UNDER THE SKIN EVERY 14 DAYS   fluticasone (FLONASE) 50 MCG/ACT nasal spray Place 2 sprays into both nostrils 2 (two) times daily.   fluticasone-salmeterol (WIXELA INHUB)  250-50 MCG/ACT AEPB Inhale 1 puff into the lungs in the morning and at bedtime.   ibuprofen (IBU) 800 MG tablet TAKE ONE TABLET BY MOUTH 3 TIMES DAILY WITH FOOD OR MILK AS NEEDED   ipratropium (ATROVENT) 0.06 % nasal spray    ketorolac (ACULAR) 0.5 % ophthalmic solution Place 1 drop into the right eye 4 (four) times daily.   levocetirizine (XYZAL) 5 MG tablet Take 5 mg by mouth every evening.   Magnesium 300 MG CAPS Take 1 capsule by mouth daily at 6 (six) AM.   montelukast (SINGULAIR) 10 MG tablet Take 10 mg by mouth at bedtime.   pantoprazole (PROTONIX) 40 MG tablet TAKE ONE TABLET TWICE DAILY  TAKE 30 MINUTES BEFORE MEALS   topiramate (TOPAMAX) 100  MG tablet TAKE 1 TABLET BY MOUTH TWICE DAILY   traZODone (DESYREL) 50 MG tablet TAKE ONE TO THREE TABLETS BY MOUTH AT BEDTIME   ursodiol (ACTIGALL) 300 MG capsule Take 1 capsule (300 mg total) by mouth 2 (two) times daily.     Allergies:   Invokana [canagliflozin], Metformin and related, Ozempic (0.25 or 0.5 mg-dose) [semaglutide(0.25 or 0.5mg -dos)], Statins, Versed [midazolam], Zetia [ezetimibe], and Bupropion hcl   Social History   Socioeconomic History   Marital status: Married    Spouse name: Hessie Diener   Number of children: 2   Years of education: Not on file   Highest education level: Associate degree: occupational, Scientist, product/process development, or vocational program  Occupational History   Occupation: DOSHER  Tobacco Use   Smoking status: Former    Packs/day: 1.00    Years: 25.00    Additional pack years: 0.00    Total pack years: 25.00    Types: Cigarettes    Quit date: 09/01/2006    Years since quitting: 16.3   Smokeless tobacco: Never  Vaping Use   Vaping Use: Never used  Substance and Sexual Activity   Alcohol use: Yes    Comment: occ   Drug use: No   Sexual activity: Not Currently  Other Topics Concern   Not on file  Social History Narrative   Married 1979.   2 daughters.   4 grandkids.   Retired Charity fundraiser   From [employer].   Social Determinants of Health   Financial Resource Strain: Low Risk  (10/20/2022)   Overall Financial Resource Strain (CARDIA)    Difficulty of Paying Living Expenses: Not hard at all  Food Insecurity: No Food Insecurity (10/20/2022)   Hunger Vital Sign    Worried About Running Out of Food in the Last Year: Never true    Ran Out of Food in the Last Year: Never true  Transportation Needs: No Transportation Needs (10/20/2022)   PRAPARE - Administrator, Civil Service (Medical): No    Lack of Transportation (Non-Medical): No  Physical Activity: Insufficiently Active (10/20/2022)   Exercise Vital Sign    Days of Exercise per Week: 2 days     Minutes of Exercise per Session: 10 min  Stress: No Stress Concern Present (10/20/2022)   [employer] of Occupational Health - Occupational Stress Questionnaire    Feeling of Stress : Only a little  Social Connections: Socially Integrated (10/20/2022)   Social Connection and Isolation Panel [NHANES]    Frequency of Communication with Friends and Family: More than three times a week    Frequency of Social Gatherings with Friends and Family: Once a week    Attends Religious Services: More than 4 times per year  Active Member of Clubs or Organizations: Yes    Attends Banker Meetings: More than 4 times per year    Marital Status: Married     Family History: The patient's family history includes Alcohol abuse in her father and sister; Arthritis in her mother; COPD in her father; Depression in her daughter; Diabetes in her father, maternal grandfather, and paternal grandfather; Diabetes Mellitus II in her father; Early death in her father; Heart attack in her father; Hypotension in her daughter; Mental illness in her father; Stroke in her maternal grandfather and maternal grandmother. There is no history of Colon cancer, Liver cancer, Stomach cancer, Esophageal cancer, Rectal cancer, or Breast cancer.  ROS:   Please see the history of present illness.    No real problems that she is spoken of.  She did ask about her EKG.  She will be establishing with Dr. Para March.  All other systems reviewed and are negative.  EKGs/Labs/Other Studies Reviewed:    The following studies were reviewed today:   NUCLEAR STRESS TEST 2020: Study Highlights    The left ventricular ejection fraction is normal (55-65%). Nuclear stress EF: 65%. Blood pressure demonstrated a blunted response to exercise. Horizontal ST segment depression ST segment depression was noted during stress in the II and III leads, beginning at 5 minutes of stress, and returning to baseline after 5-9 minutes of  recovery. The study is normal. This is a low risk study.   Ischemic ECG changes with normal perfusion images. This suggests no significant ischemia.   EKG: EKG Interpretation  Date/Time:  Wednesday December 27 2022 14:52:22 EDT Ventricular Rate:  87 PR Interval:  160 QRS Duration: 74 QT Interval:  376 QTC Calculation: 452 R Axis:   21 Text Interpretation: Normal sinus rhythm Nonspecific ST and T wave abnormality Confirmed by Weston Brass ([id number]) on 12/27/2022 3:21:01 PM     Prior EKG normal sinus rhythm with nonspecific T wave flattening.  EKG reveals no acute abnormality.  Recent Labs: 10/24/2022: TSH 1.13 12/22/2022: ALT 32; BUN 21; Creatinine, Ser 0.75; Hemoglobin 14.4; Platelets 285; Potassium 3.9; Sodium 139  Recent Lipid Panel    Component Value Date/Time   CHOL 143 10/25/2021 0812   CHOL 191 12/16/2018 0715   TRIG 58 10/25/2021 0812   HDL 54 10/25/2021 0812   HDL 36 (L) 12/16/2018 0715   CHOLHDL 2.6 10/25/2021 0812   VLDL 12 10/25/2021 0812   LDLCALC 77 10/25/2021 0812   LDLCALC 130 (H) 12/16/2018 0715    Physical Exam:    VS:  BP 100/60 (BP Location: Right Arm, Patient Position: Sitting, Cuff Size: Normal)   Pulse 89   Ht 5\' 2"  (1.575 m)   Wt 181 lb 3.2 oz (82.2 kg)   SpO2 95%   BMI 33.14 kg/m     Wt Readings from Last 3 Encounters:  12/27/22 181 lb 3.2 oz (82.2 kg)  12/26/22 182 lb (82.6 kg)  10/24/22 176 lb (79.8 kg)    Constitutional: No acute distress Eyes: sclera non-icteric, normal conjunctiva and lids ENMT: normal dentition, moist mucous membranes Cardiovascular: regular rhythm, normal rate, faint 1/6 early peaking systolic murmur. S1 and S2 normal. No jugular venous distention.  Respiratory: clear to auscultation bilaterally GI : normal bowel sounds, soft and nontender. No distention.   MSK: extremities warm, well perfused. No edema.  NEURO: grossly nonfocal exam, moves all extremities. PSYCH: alert and oriented x 3, normal mood and affect.     ASSESSMENT:    1.  Aortic atherosclerosis (HCC)   2. Mixed hyperlipidemia   3. Type 2 diabetes mellitus with stage 1 chronic kidney disease, without long-term current use of insulin (HCC)   4. Primary biliary cirrhosis (HCC)    PLAN:    Minimal coronary artery calcifications from a CT performed in 2019 of the chest without contrast.  I reviewed the CT scan in the room with the patient and shared my findings.  Mild aortic atherosclerosis.  Currently on Repatha and lipids with adequate control.  Repeat lipids in the next few weeks and refill Repatha for an additional year when needed.  Emphasized adequate diabetes control for prevention of CAD.  Followed by gastroenterology for primary biliary cirrhosis.  LFTs are grossly unremarkable with borderline elevation of ALP.  Total time of encounter: 25 minutes total time of encounter, including 15 minutes spent in face-to-face patient care on the date of this encounter. This time includes coordination of care and counseling regarding above mentioned problem list. Remainder of non-face-to-face time involved reviewing chart documents/testing relevant to the patient encounter and documentation in the medical record. I have independently reviewed documentation from referring provider.   Weston Brass, MD, [facility]  [facility]    Medication Adjustments/Labs and Tests Ordered: Current medicines are reviewed at length with the patient today.  Concerns regarding medicines are outlined above.  Orders Placed This Encounter  Procedures   Lipid panel   EKG 12-Lead   No orders of the defined types were placed in this encounter.   Patient Instructions  Medication Instructions:  Your physician recommends that you continue on your current medications as directed. Please refer to the Current Medication list given to you today.  *If you need a refill on your cardiac medications before your next appointment, please call your  pharmacy*   Lab Work: Your physician recommends that you return for lab work in: the next week or 2 for FASTING Lipid panel  If you have labs (blood work) drawn today and your tests are completely normal, you will receive your results only by: MyChart Message (if you have MyChart) OR A paper copy in the mail If you have any lab test that is abnormal or we need to change your treatment, we will call you to review the results.    Follow-Up: At [facility], you and your health needs are our priority.  As part of our continuing mission to provide you with exceptional heart care, we have created designated Provider Care Teams.  These Care Teams include your primary Cardiologist (physician) and Advanced Practice Providers (APPs -  Physician Assistants and Nurse Practitioners) who all work together to provide you with the care you need, when you need it.  We recommend signing up for the patient portal called "MyChart".  Sign up information is provided on this After Visit Summary.  MyChart is used to connect with patients for Virtual Visits (Telemedicine).  Patients are able to view lab/test results, encounter notes, upcoming appointments, etc.  Non-urgent messages can be sent to your provider as well.   To learn more about what you can do with MyChart, go to [email or web address].    Your next appointment:   12 month(s)  Provider:   Weston Brass, MD

## 2022-12-27 NOTE — Patient Instructions (Addendum)
Medication Instructions:  Your physician recommends that you continue on your current medications as directed. Please refer to the Current Medication list given to you today.  *If you need a refill on your cardiac medications before your next appointment, please call your pharmacy*   Lab Work: Your physician recommends that you return for lab work in: the next week or 2 for FASTING Lipid panel  If you have labs (blood work) drawn today and your tests are completely normal, you will receive your results only by: MyChart Message (if you have MyChart) OR A paper copy in the mail If you have any lab test that is abnormal or we need to change your treatment, we will call you to review the results.    Follow-Up: At [facility], you and your health needs are our priority.  As part of our continuing mission to provide you with exceptional heart care, we have created designated Provider Care Teams.  These Care Teams include your primary Cardiologist (physician) and Advanced Practice Providers (APPs -  Physician Assistants and Nurse Practitioners) who all work together to provide you with the care you need, when you need it.  We recommend signing up for the patient portal called "MyChart".  Sign up information is provided on this After Visit Summary.  MyChart is used to connect with patients for Virtual Visits (Telemedicine).  Patients are able to view lab/test results, encounter notes, upcoming appointments, etc.  Non-urgent messages can be sent to your provider as well.   To learn more about what you can do with MyChart, go to [email or web address].    Your next appointment:   12 month(s)  Provider:   Weston Brass, MD

## 2022-12-28 DIAGNOSIS — Z01818 Encounter for other preprocedural examination: Secondary | ICD-10-CM | POA: Insufficient documentation

## 2022-12-28 NOTE — Assessment & Plan Note (Signed)
She can let me know if she  can't get trulicity filled.  Would start with 0.75mg  weekly.   Plan on recheck A1c at a visit in about 3 months.

## 2022-12-28 NOTE — Assessment & Plan Note (Addendum)
Assuming no issue with cardiology then would be appropriately low risk for surgery.  Can get EKG done at [facility] tomorrow.   She is seeing  Dr. Merry Proud with plastic surgery  Addendum- no contraindication for surgery noted in cardiology consult note.  She is appropriately low risk for surgery.

## 2022-12-28 NOTE — Assessment & Plan Note (Signed)
H/o, I asked her to f/u with eye clinic about this.

## 2022-12-29 DIAGNOSIS — H40021 Open angle with borderline findings, high risk, right eye: Secondary | ICD-10-CM | POA: Diagnosis not present

## 2023-01-01 ENCOUNTER — Other Ambulatory Visit: Payer: Self-pay

## 2023-01-01 MED ORDER — LEVOCETIRIZINE DIHYDROCHLORIDE 5 MG PO TABS
5.0000 mg | ORAL_TABLET | Freq: Every evening | ORAL | 5 refills | Status: DC
  Filled 2023-01-10: qty 30, 30d supply, fill #0
  Filled 2023-02-11: qty 30, 30d supply, fill #1

## 2023-01-01 MED ORDER — PANTOPRAZOLE SODIUM 40 MG PO TBEC
40.0000 mg | DELAYED_RELEASE_TABLET | Freq: Two times a day (BID) | ORAL | 1 refills | Status: DC
  Filled 2023-03-02: qty 180, 90d supply, fill #0

## 2023-01-01 MED ORDER — TOPIRAMATE 100 MG PO TABS: 100.0000 mg | ORAL_TABLET | Freq: Two times a day (BID) | ORAL | 1 refills | Status: DC

## 2023-01-01 MED ORDER — FLUTICASONE PROPIONATE 50 MCG/ACT NA SUSP: 1.0000 | Freq: Every day | NASAL | 6 refills | Status: DC

## 2023-01-01 MED ORDER — KETOROLAC TROMETHAMINE 0.5 % OP SOLN: 1.0000 [drp] | Freq: Four times a day (QID) | OPHTHALMIC | 3 refills | Status: DC

## 2023-01-01 MED ORDER — IPRATROPIUM BROMIDE 0.06 % NA SOLN: 2.0000 | Freq: Three times a day (TID) | NASAL | 6 refills | Status: DC

## 2023-01-01 MED ORDER — REPATHA SURECLICK 140 MG/ML ~~LOC~~ SOAJ
140.0000 mg | SUBCUTANEOUS | 3 refills | Status: DC
  Filled 2023-01-10 – 2023-02-05 (×2): qty 6, 84d supply, fill #0
  Filled 2023-03-21: qty 6, 84d supply, fill #1

## 2023-01-01 MED ORDER — PREDNISOLONE ACETATE 1 % OP SUSP: 1.0000 [drp] | Freq: Four times a day (QID) | OPHTHALMIC | 0 refills | Status: DC

## 2023-01-01 MED ORDER — KETOROLAC TROMETHAMINE 0.5 % OP SOLN: 1.0000 [drp] | Freq: Four times a day (QID) | OPHTHALMIC | 0 refills | Status: DC

## 2023-01-01 MED ORDER — PREDNISOLONE ACETATE 1 % OP SUSP: 1.0000 [drp] | Freq: Every day | OPHTHALMIC | 3 refills | Status: DC

## 2023-01-01 MED ORDER — URSODIOL 300 MG PO CAPS
300.0000 mg | ORAL_CAPSULE | Freq: Two times a day (BID) | ORAL | 3 refills | Status: DC
  Filled 2023-01-01: qty 180, 90d supply, fill #0

## 2023-01-01 MED ORDER — ESCITALOPRAM OXALATE 20 MG PO TABS
20.0000 mg | ORAL_TABLET | Freq: Every evening | ORAL | 3 refills | Status: DC
  Filled 2023-01-10: qty 90, 90d supply, fill #0

## 2023-01-01 MED ORDER — EMPAGLIFLOZIN 25 MG PO TABS
25.0000 mg | ORAL_TABLET | Freq: Every day | ORAL | 3 refills | Status: DC
  Filled 2023-01-10: qty 90, 90d supply, fill #0

## 2023-01-01 MED ORDER — TRAZODONE HCL 50 MG PO TABS
50.0000 mg | ORAL_TABLET | Freq: Every day | ORAL | 3 refills | Status: DC
  Filled 2023-02-11: qty 90, 90d supply, fill #0

## 2023-01-01 MED ORDER — AZELASTINE HCL 0.1 % NA SOLN: 1.0000 | Freq: Two times a day (BID) | NASAL | 6 refills | Status: DC

## 2023-01-01 MED ORDER — ALPRAZOLAM 0.5 MG PO TABS: 0.5000 mg | ORAL_TABLET | Freq: Two times a day (BID) | ORAL | 1 refills | Status: DC

## 2023-01-01 MED ORDER — MONTELUKAST SODIUM 10 MG PO TABS
10.0000 mg | ORAL_TABLET | Freq: Every day | ORAL | 3 refills | Status: DC
  Filled 2023-01-01 – 2023-03-02 (×2): qty 90, 90d supply, fill #0

## 2023-01-01 MED ORDER — FLUTICASONE-SALMETEROL 250-50 MCG/ACT IN AEPB: 1.0000 | INHALATION_SPRAY | Freq: Two times a day (BID) | RESPIRATORY_TRACT | 0 refills | Status: DC

## 2023-01-03 ENCOUNTER — Other Ambulatory Visit: Payer: Self-pay

## 2023-01-03 MED ORDER — TRULICITY 0.75 MG/0.5ML ~~LOC~~ SOAJ
0.7500 mg | SUBCUTANEOUS | 3 refills | Status: DC
  Filled 2023-01-03: qty 2, 28d supply, fill #0
  Filled 2023-02-15: qty 2, 28d supply, fill #1
  Filled 2023-03-22: qty 2, 28d supply, fill #2

## 2023-01-10 ENCOUNTER — Other Ambulatory Visit: Payer: Self-pay

## 2023-01-17 DIAGNOSIS — E782 Mixed hyperlipidemia: Secondary | ICD-10-CM | POA: Diagnosis not present

## 2023-01-17 DIAGNOSIS — I700 Atherosclerosis of aorta: Secondary | ICD-10-CM | POA: Diagnosis not present

## 2023-01-17 LAB — LIPID PANEL
Chol/HDL Ratio: 2.5 ratio (ref 0.0–4.4)
Cholesterol, Total: 117 mg/dL (ref 100–199)
HDL: 46 mg/dL (ref >39)
LDL Chol Calc (NIH): 53 mg/dL (ref 0–99)
Triglycerides: 98 mg/dL (ref 0–149)
VLDL Cholesterol Cal: 18 mg/dL (ref 5–40)

## 2023-01-25 ENCOUNTER — Encounter: Payer: Self-pay | Admitting: Family Medicine

## 2023-01-25 ENCOUNTER — Other Ambulatory Visit: Payer: Self-pay

## 2023-01-25 MED ORDER — IBUPROFEN 800 MG PO TABS
800.0000 mg | ORAL_TABLET | Freq: Three times a day (TID) | ORAL | 1 refills | Status: DC | PRN
  Filled 2023-01-25: qty 90, 30d supply, fill #0
  Filled 2023-06-27: qty 90, 30d supply, fill #1

## 2023-02-05 ENCOUNTER — Other Ambulatory Visit: Payer: Self-pay

## 2023-02-12 ENCOUNTER — Other Ambulatory Visit: Payer: Self-pay

## 2023-02-22 ENCOUNTER — Encounter (INDEPENDENT_AMBULATORY_CARE_PROVIDER_SITE_OTHER): Payer: Self-pay

## 2023-03-04 ENCOUNTER — Other Ambulatory Visit: Payer: Self-pay

## 2023-03-14 ENCOUNTER — Encounter (INDEPENDENT_AMBULATORY_CARE_PROVIDER_SITE_OTHER): Payer: Medicare PPO | Admitting: Ophthalmology

## 2023-03-14 NOTE — Progress Notes (Signed)
[facility] - Clinic Note  03/21/2023     CHIEF COMPLAINT Patient presents for Retina Follow Up   HISTORY OF PRESENT ILLNESS: Natasha Franco is a 71 y.o. female who presents to the clinic today for:   HPI     Retina Follow Up   Patient presents with  Other.  In right eye.  This started 5 months ago.  I, the attending physician,  performed the HPI with the patient and updated documentation appropriately.        Comments   Patient here for 5 months retina follow up for CME OD. Patient states vision seem to be alright. No eye pain. Started itching today.      Last edited by Rennis Chris, MD on 03/25/2023 11:19 PM.    Pt states vision is stable, but she stopped using Prolensa in July  Referring physician: Maris Berger MD [address] [place], [place] [address]  HISTORICAL INFORMATION:   Selected notes from the MEDICAL RECORD NUMBER Referred by Dr. Charlotte Sanes for retinoschisis OD LEE:  Ocular Hx- HH plaque X2 OS (2018), cataracts, progressive retinoschisis OD PMH-    CURRENT MEDICATIONS: Current Outpatient Medications (Ophthalmic Drugs)  Medication Sig   ketorolac (ACULAR) 0.5 % ophthalmic solution Place 1 drop into the right eye 4 (four) times daily.   ketorolac (ACULAR) 0.5 % ophthalmic solution Place 1 drop into the right eye 4 (four) times daily.   ketorolac (ACULAR) 0.5 % ophthalmic solution Place 1 drop into the right eye 4 (four) times daily.   prednisoLONE acetate (PRED FORTE) 1 % ophthalmic suspension Place 1 drop into the right eye daily.   prednisoLONE acetate (PRED FORTE) 1 % ophthalmic suspension Place 1 drop into the right eye 4 (four) times daily.   No current facility-administered medications for this visit. (Ophthalmic Drugs)   Current Outpatient Medications (Other)  Medication Sig   ALPRAZolam (XANAX) 0.5 MG tablet TAKE 1 TABLET BY MOUTH TWICE DAILY AS NEEDED.   ALPRAZolam (XANAX) 0.5 MG tablet Take 1 tablet (0.5 mg total) by  mouth 2 (two) times daily as needed.   azelastine (ASTELIN) 0.1 % nasal spray Place 2 sprays into both nostrils 2 (two) times daily. Use in each nostril as directed   azelastine (ASTELIN) 0.1 % nasal spray Place 1-2 sprays into both nostrils 2 (two) times daily.   B Complex Vitamins (VITAMIN B COMPLEX PO) Take by mouth.   Biotin 1 MG CAPS Take 1 mg by mouth daily.   Dulaglutide (TRULICITY) 0.75 MG/0.5ML SOPN Inject 0.75 mg into the skin once a week.   Dulaglutide (TRULICITY) 0.75 MG/0.5ML SOPN Inject 0.75 mg into the skin once a week.   empagliflozin (JARDIANCE) 25 MG TABS tablet Take 1 tablet (25 mg total) by mouth daily.   empagliflozin (JARDIANCE) 25 MG TABS tablet Take 1 tablet (25 mg total) by mouth daily.   escitalopram (LEXAPRO) 20 MG tablet TAKE ONE TABLET BY MOUTH AT BEDTIME   escitalopram (LEXAPRO) 20 MG tablet Take 1 tablet (20 mg total) by mouth at bedtime.   estradiol (ESTRACE VAGINAL) 0.1 MG/GM vaginal cream daily at 6 (six) AM.   Evolocumab (REPATHA SURECLICK) 140 MG/ML SOAJ INJECT 140MG  UNDER THE SKIN EVERY 14 DAYS   Evolocumab (REPATHA SURECLICK) 140 MG/ML SOAJ Inject 140 mg into the skin every 14 (fourteen) days as directed.   fluticasone (FLONASE) 50 MCG/ACT nasal spray Place 2 sprays into both nostrils 2 (two) times daily.   fluticasone (FLONASE) 50  MCG/ACT nasal spray Place 1-2 sprays into both nostrils daily.   fluticasone-salmeterol (WIXELA INHUB) 250-50 MCG/ACT AEPB Inhale 1 puff into the lungs in the morning and at bedtime.   fluticasone-salmeterol (WIXELA INHUB) 250-50 MCG/ACT AEPB Inhale 1 puff into the lungs 2 (two) times daily.   ibuprofen (IBU) 800 MG tablet Take 1 tablet (800 mg total) by mouth 3 (three) times daily with meals as needed.   ipratropium (ATROVENT) 0.06 % nasal spray    ipratropium (ATROVENT) 0.06 % nasal spray Place 2 sprays into the nose 3 (three) times daily.   ipratropium (ATROVENT) 0.06 % nasal spray Place 2 sprays into both nostrils 3 (three)  times daily.   levocetirizine (XYZAL) 5 MG tablet Take 5 mg by mouth every evening.   levocetirizine (XYZAL) 5 MG tablet Take 1 tablet (5 mg total) by mouth every evening.   Magnesium 300 MG CAPS Take 1 capsule by mouth daily at 6 (six) AM.   montelukast (SINGULAIR) 10 MG tablet Take 10 mg by mouth at bedtime.   montelukast (SINGULAIR) 10 MG tablet Take 1 tablet (10 mg total) by mouth daily.   pantoprazole (PROTONIX) 40 MG tablet TAKE ONE TABLET TWICE DAILY  TAKE 30 MINUTES BEFORE MEALS   pantoprazole (PROTONIX) 40 MG tablet Take 1 tablet (40 mg total) by mouth 2 (two) times daily 30 minutes before meals.   topiramate (TOPAMAX) 100 MG tablet TAKE 1 TABLET BY MOUTH TWICE DAILY   topiramate (TOPAMAX) 100 MG tablet Take 1 tablet (100 mg total) by mouth 2 (two) times daily.   traZODone (DESYREL) 50 MG tablet TAKE ONE TO THREE TABLETS BY MOUTH AT BEDTIME   traZODone (DESYREL) 50 MG tablet Take 1 - 3 tablets (150 mg total) by mouth at bedtime.   ursodiol (ACTIGALL) 300 MG capsule Take 1 capsule (300 mg total) by mouth 2 (two) times daily.   ursodiol (ACTIGALL) 300 MG capsule Take 1 capsule (300 mg total) by mouth 2 (two) times daily.   levocetirizine (XYZAL) 5 MG tablet Take 1 tablet (5 mg total) by mouth daily.   No current facility-administered medications for this visit. (Other)   REVIEW OF SYSTEMS: ROS   Positive for: Gastrointestinal, Neurological, Musculoskeletal, Endocrine, Eyes Negative for: Constitutional, Skin, Genitourinary, HENT, Cardiovascular, Respiratory, Psychiatric, Allergic/Imm, Heme/Lymph Last edited by Laddie Aquas, COA on 03/21/2023 12:58 PM.     ALLERGIES Allergies  Allergen Reactions   Invokana [Canagliflozin]     VAGINAL YEAST INFECTIONS   Metformin And Related     GI upset   Ozempic (0.25 Or 0.5 Mg-Dose) [Semaglutide(0.25 Or 0.5mg -Dos)]     GI intolerance.  Has tolerated trulicity.     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   BREAST BIOPSY Left 2014   BREAST SURGERY Bilateral 1984   reduction mammoplasty   CARPAL TUNNEL RELEASE     CESAREAN SECTION     JOINT REPLACEMENT     REDUCTION MAMMAPLASTY Bilateral    ROTATOR CUFF REPAIR     TONSILLECTOMY     TOTAL KNEE ARTHROPLASTY Left 03/25/2019   Procedure: Left Knee Arthroplasty;  Surgeon: Marcene Corning, MD;  Location: [facility];  Service: Orthopedics;  Laterality: Left;   VAGINAL HYSTERECTOMY     FAMILY HISTORY Family History  Problem Relation Age of Onset   Arthritis Mother    Early death Father    Diabetes Father    Alcohol abuse Father    COPD Father    Diabetes Mellitus II Father    Heart attack Father    Mental illness Father    Alcohol abuse Sister    Stroke Maternal Grandmother    Stroke Maternal Grandfather    Diabetes Maternal Grandfather    Diabetes Paternal Grandfather    Depression Daughter    Hypotension Daughter        POTS   Colon cancer Neg Hx    Liver cancer Neg Hx    Stomach cancer Neg Hx    Esophageal cancer Neg Hx    Rectal cancer Neg Hx    Breast cancer Neg Hx    SOCIAL HISTORY Social History   Tobacco Use   Smoking status: Former    Current packs/day: 0.00    Average packs/day: 1 pack/day for 25.0 years (25.0 ttl pk-yrs)    Types: Cigarettes    Start date: 09/01/1981    Quit date:  09/01/2006    Years since quitting: 16.5   Smokeless tobacco: Never  Vaping Use   Vaping status: Never Used  Substance Use Topics   Alcohol use: Yes    Comment: occ   Drug use: No       OPHTHALMIC EXAM:  Base Eye Exam     Visual Acuity (Snellen - Linear)       Right Left   Dist [place] 20/20 -2 20/20 -1         Tonometry (Tonopen, 12:56 PM)       Right Left   Pressure 11 10         Pupils       Dark Light Shape React APD   Right 3 2 Round Minimal None   Left 3 2 Round Minimal None         Visual Fields (Counting fingers)       Left Right    Full Full         Extraocular Movement       Right Left    Full, Ortho Full, Ortho         Neuro/Psych     Oriented x3: Yes   Mood/Affect: Normal         Dilation     Both eyes: 1.0% Mydriacyl, 2.5% Phenylephrine @ 12:56 PM           Slit Lamp and Fundus Exam     Slit Lamp Exam       Right Left   Lids/Lashes Dermatochalasis - upper lid Dermatochalasis - upper lid, mild MGD, Telangiectasia   Conjunctiva/Sclera White and quiet, trace STK ST quad, temporal pinguecula White and quiet   Cornea well healed cataract wound, trace tear film  debris Trace PEE, tear film debris, well healed cataract wound   Anterior Chamber deep and clear, no cell or flare deep and clear, no cell or flare   Iris Round and dilated Round and dilated   Lens PC IOL in good position PC IOL in good position   Anterior Vitreous Vitreous syneresis, 0.5+fine pigment, Posterior vitreous detachment, pigmented debris settled inferiory, vitreous condensations Vitreous syneresis, no pigment, Posterior vitreous detachment, vitreous condensations         Fundus Exam       Right Left   Disc Pink and Sharp, mild tilt, inferior PPA Pink and Sharp, tilted, inferior PPA   C/D Ratio 0.5 0.5   Macula Flat, Blunted foveal reflex, trace cystic changes, mild RPE mottling greatest centrally, No heme, no frank edema Flat, Blunted foveal reflex,  mild RPE mottling, No heme or edema   Vessels attenuated, Tortuous, mild AV crossing changes attenuated, Tortuous, mild AV crossing changes   Periphery Attached, inferior schisis from 0400-0730 with pigment clumping and pigment atrophy within schisis cavity, outer retinal hole at 0700 -- good barricade laser changes posterior to schisis, no new RT/RD/lattice or schisis Attached, very shallow schisis IT periphery, mild reticular degeneration           IMAGING AND PROCEDURES  Imaging and Procedures for 03/21/2023  OCT, Retina - OU - Both Eyes       Right Eye Quality was good. Central Foveal Thickness: 309. Progression has worsened. Findings include normal foveal contour, no IRF, no SRF (Trace cystic changes inferior macula, mild interval improvement in central ellipsoid signal, IT schisis with outer retinal holes caught on widefield -- not imaged today).   Left Eye Quality was good. Central Foveal Thickness: 280. Progression has been stable. Findings include normal foveal contour, no IRF, no SRF, vitreomacular adhesion (Shallow schisis IT periphery caught on widefield - not imaged today).   Notes *Images captured and stored on drive  Diagnosis / Impression:  OD: Trace cystic changes inferior macula, mild interval improvement in central ellipsoid signal, IT schisis with outer retinal holes caught on widefield -- not imaged today OS: NFP; no IRF/SRF; Shallow schisis IT periphery caught on widefield -- not imaged today  Clinical management:  See below  Abbreviations: NFP - Normal foveal profile. CME - cystoid macular edema. PED - pigment epithelial detachment. IRF - intraretinal fluid. SRF - subretinal fluid. EZ - ellipsoid zone. ERM - epiretinal membrane. ORA - outer retinal atrophy. ORT - outer retinal tubulation. SRHM - subretinal hyper-reflective material. IRHM - intraretinal hyper-reflective material            ASSESSMENT/PLAN:    ICD-10-CM   1. 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 Diabetes mellitus type 2 without retinopathy (HCC)  E11.9     5. Long term (current) use of oral hypoglycemic drugs  Z79.84     6. Long-term (current) use of injectable non-insulin antidiabetic drugs  Z79.85     7. Pseudophakia, both eyes  Z96.1     8. Dry eyes  H04.123       1. CME OD - development of IRF and sliver of SRF centrally -- consistent with CME, noted on 07.31.23 visit - suspect delayed [place], s/p CEIOL with Dr. Charlotte Sanes in March - started on PF and Ketorolac QID OD  on 07.31.23 - recurrent CME following taper after 08.22.23 visit - s/p STK OD #1 (11.15.23) - today, OCT shows trace cystic changes inferior macula, mild interval improvement in central ellipsoid signal, IT schisis with outer retinal holes caught on widefield -- not imaged today - BCVA 20/20 -- improved - dec PF OD to 1 drop every other day -- pt stopped using in July - STK informed consent obtained and signed, 11.15.23 (OD)  - f/u 3 months, sooner prn -- DFE, OCT  2,3. Retinoschisis OU - OD: inferior schisis 0400-0730 with pigment clumping and pigment atrophy within schisis cavity, outer retinal hole at 0700 - OS: very shallow schisis IT arcades  - pt reports intermittent photopsias and floaters OU -- likely unrelated to schisis - s/p laser retinopexy OD (01.30.23) -- good barricade laser changes posterior to schisis - monitor  4-6. Diabetes mellitus, type 2 without retinopathy  - last A1c was 6.5 on 02.27.24 - The incidence, risk factors for progression, natural history and treatment options for diabetic retinopathy  were discussed with patient.   - The need for close monitoring of blood glucose, blood pressure, and serum lipids, avoiding cigarette or any type of tobacco, and the need for long term follow up was also discussed with patient. - f/u in 1 year, sooner  prn  7. Pseudophakia OU  - s/p CE/IOL OU (Dr. Charlotte Sanes, March / April 2023)  - IOLs in perfect position, doing well  - mild CME OD as above  - monitor  8. Dry eyes OU (OD > OS) - recommend artificial tears and lubricating ointment as needed   Ophthalmic Meds Ordered this visit:  No orders of the defined types were placed in this encounter.    Return in about 3 months (around 06/20/2023) for f/u months, CME OD, DFE, OCT.  There are no Patient Instructions on file for this visit.   Explained the diagnoses, plan, and follow up with the patient and they expressed understanding.  Patient expressed understanding of the importance of proper follow up care.   This document serves as a record of services personally performed by Karie Chimera, MD, PhD. It was created on their behalf by De Blanch, an ophthalmic technician. The creation of this record is the provider's dictation and/or activities during the visit.    Electronically signed by: De Blanch, OA, 03/25/23  11:20 PM  This document serves as a record of services personally performed by Karie Chimera, MD, PhD. It was created on their behalf by Glee Arvin. Manson Passey, OA an ophthalmic technician. The creation of this record is the provider's dictation and/or activities during the visit.    Electronically signed by: Glee Arvin. Manson Passey, OA 03/25/23 11:20 PM  Karie Chimera, M.D., Ph.D. Diseases & Surgery of the Retina and Vitreous [facility]  I have reviewed the above documentation for accuracy and completeness, and I agree with the above. Karie Chimera, M.D., Ph.D. 03/25/23 11:22 PM   Abbreviations: M myopia (nearsighted); A astigmatism; H hyperopia (farsighted); P presbyopia; Mrx spectacle prescription;  CTL contact lenses; OD right eye; OS left eye; OU both eyes  XT exotropia; ET esotropia; PEK punctate epithelial keratitis; PEE punctate epithelial erosions; DES dry eye syndrome; MGD meibomian gland  dysfunction; ATs artificial tears; PFAT's preservative free artificial tears; NSC nuclear sclerotic cataract; PSC posterior subcapsular cataract; ERM epi-retinal membrane; PVD posterior vitreous detachment; RD retinal detachment; DM diabetes mellitus; DR diabetic retinopathy; NPDR non-proliferative diabetic retinopathy; PDR proliferative diabetic  retinopathy; CSME clinically significant macular edema; DME diabetic macular edema; dbh dot blot hemorrhages; CWS cotton wool spot; POAG primary open angle glaucoma; C/D cup-to-disc ratio; HVF humphrey visual field; GVF goldmann visual field; OCT optical coherence tomography; IOP intraocular pressure; BRVO Branch retinal vein occlusion; CRVO central retinal vein occlusion; CRAO central retinal artery occlusion; BRAO branch retinal artery occlusion; RT retinal tear; SB scleral buckle; PPV pars plana vitrectomy; VH Vitreous hemorrhage; PRP panretinal laser photocoagulation; IVK intravitreal kenalog; VMT vitreomacular traction; MH Macular hole;  NVD neovascularization of the disc; NVE neovascularization elsewhere; AREDS age related eye disease study; ARMD age related macular degeneration; POAG primary open angle glaucoma; EBMD epithelial/anterior basement membrane dystrophy; ACIOL anterior chamber intraocular lens; IOL intraocular lens; PCIOL posterior chamber intraocular lens; Phaco/IOL phacoemulsification with intraocular lens placement; PRK photorefractive keratectomy; LASIK laser assisted in situ keratomileusis; HTN hypertension; DM diabetes mellitus; COPD chronic obstructive pulmonary disease

## 2023-03-21 ENCOUNTER — Other Ambulatory Visit: Payer: Self-pay

## 2023-03-21 ENCOUNTER — Encounter (INDEPENDENT_AMBULATORY_CARE_PROVIDER_SITE_OTHER): Payer: Self-pay | Admitting: Ophthalmology

## 2023-03-21 ENCOUNTER — Ambulatory Visit (INDEPENDENT_AMBULATORY_CARE_PROVIDER_SITE_OTHER): Payer: Medicare PPO | Admitting: Ophthalmology

## 2023-03-21 DIAGNOSIS — Z7984 Long term (current) use of oral hypoglycemic drugs: Secondary | ICD-10-CM | POA: Diagnosis not present

## 2023-03-21 DIAGNOSIS — H33321 Round hole, right eye: Secondary | ICD-10-CM | POA: Diagnosis not present

## 2023-03-21 DIAGNOSIS — H33103 Unspecified retinoschisis, bilateral: Secondary | ICD-10-CM | POA: Diagnosis not present

## 2023-03-21 DIAGNOSIS — E119 Type 2 diabetes mellitus without complications: Secondary | ICD-10-CM

## 2023-03-21 DIAGNOSIS — Z961 Presence of intraocular lens: Secondary | ICD-10-CM | POA: Diagnosis not present

## 2023-03-21 DIAGNOSIS — H04123 Dry eye syndrome of bilateral lacrimal glands: Secondary | ICD-10-CM | POA: Diagnosis not present

## 2023-03-21 DIAGNOSIS — H35351 Cystoid macular degeneration, right eye: Secondary | ICD-10-CM

## 2023-03-21 DIAGNOSIS — Z7985 Long-term (current) use of injectable non-insulin antidiabetic drugs: Secondary | ICD-10-CM

## 2023-03-22 ENCOUNTER — Other Ambulatory Visit: Payer: Self-pay

## 2023-03-22 MED ORDER — LEVOCETIRIZINE DIHYDROCHLORIDE 5 MG PO TABS
5.0000 mg | ORAL_TABLET | Freq: Every day | ORAL | 5 refills | Status: DC
  Filled 2023-03-22: qty 30, 30d supply, fill #0

## 2023-03-23 ENCOUNTER — Other Ambulatory Visit: Payer: Self-pay

## 2023-03-25 ENCOUNTER — Encounter (INDEPENDENT_AMBULATORY_CARE_PROVIDER_SITE_OTHER): Payer: Self-pay | Admitting: Ophthalmology

## 2023-03-29 ENCOUNTER — Ambulatory Visit: Payer: Medicare PPO | Admitting: Family Medicine

## 2023-03-29 ENCOUNTER — Other Ambulatory Visit: Payer: Self-pay

## 2023-03-29 ENCOUNTER — Encounter: Payer: Self-pay | Admitting: Family Medicine

## 2023-03-29 VITALS — BP 114/78 | HR 69 | Temp 98.1°F | Ht 62.0 in | Wt 176.0 lb

## 2023-03-29 DIAGNOSIS — Z79899 Other long term (current) drug therapy: Secondary | ICD-10-CM

## 2023-03-29 DIAGNOSIS — Z7984 Long term (current) use of oral hypoglycemic drugs: Secondary | ICD-10-CM

## 2023-03-29 DIAGNOSIS — Z789 Other specified health status: Secondary | ICD-10-CM

## 2023-03-29 DIAGNOSIS — Z7985 Long-term (current) use of injectable non-insulin antidiabetic drugs: Secondary | ICD-10-CM | POA: Diagnosis not present

## 2023-03-29 DIAGNOSIS — E119 Type 2 diabetes mellitus without complications: Secondary | ICD-10-CM | POA: Diagnosis not present

## 2023-03-29 DIAGNOSIS — L299 Pruritus, unspecified: Secondary | ICD-10-CM | POA: Diagnosis not present

## 2023-03-29 DIAGNOSIS — Z23 Encounter for immunization: Secondary | ICD-10-CM | POA: Diagnosis not present

## 2023-03-29 LAB — POCT GLYCOSYLATED HEMOGLOBIN (HGB A1C): Hemoglobin A1C: 7.9 % — AB (ref 4.0–5.6)

## 2023-03-29 MED ORDER — ACCU-CHEK SOFTCLIX LANCETS MISC
12 refills | Status: AC
  Filled 2023-03-29: qty 100, 90d supply, fill #0
  Filled 2023-09-06: qty 100, 90d supply, fill #1

## 2023-03-29 MED ORDER — GLUCOSE BLOOD VI STRP
ORAL_STRIP | 12 refills | Status: AC
  Filled 2023-03-29: qty 100, 90d supply, fill #0
  Filled 2023-08-19: qty 100, 90d supply, fill #1

## 2023-03-29 MED ORDER — ACCU-CHEK GUIDE W/DEVICE KIT
PACK | 0 refills | Status: DC
  Filled 2023-03-29: qty 1, 30d supply, fill #0

## 2023-03-29 MED ORDER — ESTRADIOL 0.1 MG/GM VA CREA
1.0000 | TOPICAL_CREAM | VAGINAL | 1 refills | Status: AC
  Filled 2023-03-29: qty 42.5, 28d supply, fill #0
  Filled 2023-04-16 – 2023-09-18 (×2): qty 42.5, 28d supply, fill #1

## 2023-03-29 MED ORDER — TRULICITY 1.5 MG/0.5ML ~~LOC~~ SOAJ
1.5000 mg | SUBCUTANEOUS | 1 refills | Status: DC
  Filled 2023-03-29: qty 6, 84d supply, fill #0

## 2023-03-29 NOTE — Progress Notes (Signed)
Diabetes:  Using medications without difficulties: yes, jardiance 25mg  and trulicity 0.75mg .   Hypoglycemic episodes: no  Hyperglycemic episodes: no  Feet problems: no  Blood Sugars averaging: not checked, needs a new meter.  Rx sent.  eye exam within last year: yes Statin intolerant.   A1c done at [facility].   D/w pt.  7.9., higher than prev.   Diet and exercise d/w pt.    She had itching w/o rash that improved benadryl prn.  Still on xyzal at baseline.    She is going to follow up with GI.    Estrogen used with relief, ~2-3 times per week.  Refill sent.  Meds, vitals, and allergies reviewed.   ROS: Per HPI unless specifically indicated in ROS section   GEN: nad, alert and oriented HEENT: ncat NECK: supple w/o LA CV: rrr. PULM: ctab, no inc wob ABD: soft, +bs EXT: no edema SKIN: no acute rash  Diabetic foot exam: Normal inspection No skin breakdown No calluses  Normal DP pulses Normal sensation to light touch and monofilament Nails normal

## 2023-03-29 NOTE — Patient Instructions (Signed)
Recheck A1c at a visit in about 3 months.   Increase trulicity to 1.5mg  per week in the meantime.  Take care.  Glad to see you.

## 2023-03-29 NOTE — Assessment & Plan Note (Signed)
Statin intolerant

## 2023-03-30 ENCOUNTER — Other Ambulatory Visit: Payer: Self-pay | Admitting: Family Medicine

## 2023-03-30 ENCOUNTER — Encounter: Payer: Self-pay | Admitting: Family Medicine

## 2023-03-30 ENCOUNTER — Other Ambulatory Visit: Payer: Self-pay

## 2023-03-30 MED ORDER — ONDANSETRON HCL 4 MG PO TABS
4.0000 mg | ORAL_TABLET | Freq: Three times a day (TID) | ORAL | 0 refills | Status: AC | PRN
  Filled 2023-03-30: qty 20, 7d supply, fill #0

## 2023-03-30 MED ORDER — TRULICITY 0.75 MG/0.5ML ~~LOC~~ SOAJ: 0.7500 mg | SUBCUTANEOUS | Status: DC

## 2023-03-31 DIAGNOSIS — Z79818 Long term (current) use of other agents affecting estrogen receptors and estrogen levels: Secondary | ICD-10-CM | POA: Insufficient documentation

## 2023-03-31 DIAGNOSIS — Z79899 Other long term (current) drug therapy: Secondary | ICD-10-CM | POA: Insufficient documentation

## 2023-03-31 DIAGNOSIS — L299 Pruritus, unspecified: Secondary | ICD-10-CM | POA: Insufficient documentation

## 2023-03-31 NOTE — Assessment & Plan Note (Signed)
Without rash.  Continue Xyzal along with Benadryl as needed.  Update me as needed.

## 2023-03-31 NOTE — Assessment & Plan Note (Signed)
Topical estrogen used with relief, ~2-3 times per week.  Refill sent.  Continue as is.  She agrees to plan.

## 2023-03-31 NOTE — Assessment & Plan Note (Signed)
Statin intolerant.   A1c done at OV.   D/w pt.  7.9., higher than prev.   Diet and exercise d/w pt.    Continue Jardiance but try increasing Trulicity to 1.5 mg per dose with routine cautions discussed with patient.  Report back as needed.  Recheck periodically.

## 2023-04-01 ENCOUNTER — Other Ambulatory Visit: Payer: Self-pay | Admitting: Family Medicine

## 2023-04-01 MED ORDER — TRULICITY 0.75 MG/0.5ML ~~LOC~~ SOAJ
0.7500 mg | SUBCUTANEOUS | 3 refills | Status: DC
  Filled 2023-04-01: qty 6, 84d supply, fill #0
  Filled 2023-04-02: qty 2, 28d supply, fill #0
  Filled 2023-04-02: qty 6, 84d supply, fill #0
  Filled 2023-04-03: qty 2, 28d supply, fill #0
  Filled 2023-04-16: qty 6, 84d supply, fill #0
  Filled 2023-06-28: qty 6, 84d supply, fill #1
  Filled 2023-09-28: qty 2, 28d supply, fill #2
  Filled 2023-10-25: qty 2, 28d supply, fill #3
  Filled 2023-11-28: qty 2, 28d supply, fill #4
  Filled 2023-12-20: qty 2, 28d supply, fill #5
  Filled 2024-01-16 – 2024-01-17 (×2): qty 2, 28d supply, fill #6
  Filled 2024-02-13: qty 2, 28d supply, fill #7

## 2023-04-02 ENCOUNTER — Other Ambulatory Visit: Payer: Self-pay

## 2023-04-02 ENCOUNTER — Encounter: Payer: Self-pay | Admitting: Gastroenterology

## 2023-04-03 ENCOUNTER — Other Ambulatory Visit: Payer: Self-pay

## 2023-04-09 ENCOUNTER — Encounter: Payer: Self-pay | Admitting: Family Medicine

## 2023-04-09 ENCOUNTER — Other Ambulatory Visit: Payer: Self-pay

## 2023-04-11 ENCOUNTER — Other Ambulatory Visit: Payer: Self-pay | Admitting: Family Medicine

## 2023-04-11 MED ORDER — URSODIOL 300 MG PO CAPS
300.0000 mg | ORAL_CAPSULE | Freq: Two times a day (BID) | ORAL | 3 refills | Status: DC
  Filled 2023-04-11: qty 180, 90d supply, fill #0
  Filled 2023-07-06: qty 180, 90d supply, fill #1

## 2023-04-11 MED ORDER — EMPAGLIFLOZIN 25 MG PO TABS
25.0000 mg | ORAL_TABLET | Freq: Every day | ORAL | 3 refills | Status: DC
  Filled 2023-04-11: qty 90, 90d supply, fill #0
  Filled 2023-07-06: qty 90, 90d supply, fill #1
  Filled 2023-10-04: qty 90, 90d supply, fill #2

## 2023-04-12 ENCOUNTER — Other Ambulatory Visit: Payer: Self-pay

## 2023-04-16 ENCOUNTER — Other Ambulatory Visit: Payer: Self-pay

## 2023-04-16 ENCOUNTER — Other Ambulatory Visit: Payer: Self-pay | Admitting: Family Medicine

## 2023-04-17 ENCOUNTER — Other Ambulatory Visit: Payer: Self-pay

## 2023-04-17 ENCOUNTER — Encounter: Payer: Self-pay | Admitting: Internal Medicine

## 2023-04-17 DIAGNOSIS — E782 Mixed hyperlipidemia: Secondary | ICD-10-CM

## 2023-04-17 MED ORDER — REPATHA SURECLICK 140 MG/ML ~~LOC~~ SOAJ
140.0000 mg | SUBCUTANEOUS | 3 refills | Status: DC
Start: 2023-04-17 — End: 2024-02-18
  Filled 2023-04-17: qty 6, fill #0
  Filled 2023-04-20: qty 2, 28d supply, fill #0
  Filled 2023-04-23: qty 6, 84d supply, fill #0
  Filled 2023-06-27: qty 6, 84d supply, fill #1
  Filled 2023-09-28: qty 6, 84d supply, fill #2
  Filled 2023-11-12 – 2023-11-15 (×2): qty 6, 84d supply, fill #3

## 2023-04-17 MED ORDER — LEVOCETIRIZINE DIHYDROCHLORIDE 5 MG PO TABS
5.0000 mg | ORAL_TABLET | Freq: Every evening | ORAL | 3 refills | Status: DC
  Filled 2023-04-17: qty 90, 90d supply, fill #0
  Filled 2023-07-22: qty 90, 90d supply, fill #1
  Filled 2023-10-04 – 2023-10-05 (×2): qty 90, 90d supply, fill #2

## 2023-04-20 ENCOUNTER — Other Ambulatory Visit: Payer: Self-pay

## 2023-04-23 ENCOUNTER — Other Ambulatory Visit: Payer: Self-pay

## 2023-05-01 ENCOUNTER — Encounter: Payer: Self-pay | Admitting: Family Medicine

## 2023-05-02 ENCOUNTER — Other Ambulatory Visit: Payer: Self-pay | Admitting: Family Medicine

## 2023-05-02 MED ORDER — ESCITALOPRAM OXALATE 20 MG PO TABS
20.0000 mg | ORAL_TABLET | Freq: Every day | ORAL | 3 refills | Status: DC
  Filled 2023-05-02: qty 90, 90d supply, fill #0
  Filled 2023-09-06: qty 90, 90d supply, fill #1
  Filled 2023-12-13: qty 90, 90d supply, fill #2
  Filled 2024-03-18: qty 90, 90d supply, fill #3

## 2023-05-02 MED ORDER — ALPRAZOLAM 0.5 MG PO TABS
0.5000 mg | ORAL_TABLET | Freq: Two times a day (BID) | ORAL | 1 refills | Status: DC | PRN
  Filled 2023-05-02: qty 60, 30d supply, fill #0

## 2023-05-03 ENCOUNTER — Other Ambulatory Visit: Payer: Self-pay

## 2023-05-17 ENCOUNTER — Other Ambulatory Visit: Payer: Self-pay | Admitting: Family Medicine

## 2023-05-17 DIAGNOSIS — Z1231 Encounter for screening mammogram for malignant neoplasm of breast: Secondary | ICD-10-CM

## 2023-05-24 ENCOUNTER — Ambulatory Visit: Payer: Medicare PPO

## 2023-05-24 VITALS — Ht 62.0 in | Wt 176.0 lb

## 2023-05-24 DIAGNOSIS — Z0000 Encounter for general adult medical examination without abnormal findings: Secondary | ICD-10-CM

## 2023-05-24 NOTE — Progress Notes (Signed)
Subjective:   Natasha Franco is a 72 y.o. female who presents for Medicare Annual (Subsequent) preventive examination.  Visit Complete: Virtual I connected with  Duard Brady on 05/24/23 by a audio enabled telemedicine application and verified that I am speaking with the correct person using two identifiers.  Patient Location: Home  Provider Location: Office/Clinic  I discussed the limitations of evaluation and management by telemedicine. The patient expressed understanding and agreed to proceed.  Vital Signs: Because this visit was a virtual/telehealth visit, some criteria may be missing or patient reported. Any vitals not documented were not able to be obtained and vitals that have been documented are patient reported.  Patient Medicare AWV questionnaire was completed by the patient on (not done); I have confirmed that all information answered by patient is correct and no changes since this date.  Cardiac Risk Factors include: advanced age (>66men, >66 women);diabetes mellitus;obesity (BMI >30kg/m2)    Objective:    Today's Vitals   05/24/23 0830 05/24/23 0831  Weight: 176 lb (79.8 kg)   Height: 5\' 2"  (1.575 m)   PainSc:  2    Body mass index is 32.19 kg/m.     05/24/2023    8:50 AM 05/23/2022    9:19 AM 04/15/2019    8:02 AM 03/25/2019    1:45 PM 03/19/2019   10:27 AM  Advanced Directives  Does Patient Have a Medical Advance Directive? Yes Yes Yes Yes Yes  Type of Estate agent of [place];Living will Healthcare Power of [place];Living will Healthcare Power of [place];Living will Healthcare Power of [place];Living will Healthcare Power of [place];Living will  Does patient want to make changes to medical advance directive?   No - Patient declined No - Patient declined   Copy of Healthcare Power of Attorney in Chart? No - copy requested No - copy requested       Current Medications (verified) Outpatient Encounter Medications as of 05/24/2023   Medication Sig   Accu-Chek Softclix Lancets lancets Use to check blood sugar daily. Dx E11.9   ALPRAZolam (XANAX) 0.5 MG tablet Take 1 tablet (0.5 mg total) by mouth 2 (two) times daily as needed.   azelastine (ASTELIN) 0.1 % nasal spray Place 2 sprays into both nostrils 2 (two) times daily. Use in each nostril as directed   Biotin 1 MG CAPS Take 1 mg by mouth daily.   Blood Glucose Monitoring Suppl (ACCU-CHEK GUIDE) w/Device KIT Use to check blood sugar daily. Dx E11.9   Dulaglutide (TRULICITY) 0.75 MG/0.5ML SOPN Inject 0.75 mg into the skin once a week.   empagliflozin (JARDIANCE) 25 MG TABS tablet Take 1 tablet (25 mg total) by mouth daily.   escitalopram (LEXAPRO) 20 MG tablet Take 1 tablet (20 mg total) by mouth at bedtime.   estradiol (ESTRACE VAGINAL) 0.1 MG/GM vaginal cream Place 1 Applicatorful vaginally 3 (three) times a week.   Evolocumab (REPATHA SURECLICK) 140 MG/ML SOAJ Inject 140 mg into the skin every 14 (fourteen) days.   fluticasone (FLONASE) 50 MCG/ACT nasal spray Place 2 sprays into both nostrils 2 (two) times daily.   fluticasone-salmeterol (WIXELA INHUB) 250-50 MCG/ACT AEPB Inhale 1 puff into the lungs in the morning and at bedtime.   glucose blood test strip Use as instructed to check blood sugar daily. Dx E11.9   ibuprofen (IBU) 800 MG tablet Take 1 tablet (800 mg total) by mouth 3 (three) times daily with meals as needed.   ipratropium (ATROVENT) 0.06 % nasal spray Place 2  sprays into both nostrils 3 (three) times daily.   levocetirizine (XYZAL) 5 MG tablet Take 1 tablet (5 mg total) by mouth every evening.   montelukast (SINGULAIR) 10 MG tablet Take 10 mg by mouth at bedtime.   pantoprazole (PROTONIX) 40 MG tablet Take 1 tablet (40 mg total) by mouth 2 (two) times daily 30 minutes before meals.   topiramate (TOPAMAX) 100 MG tablet TAKE 1 TABLET BY MOUTH TWICE DAILY   traZODone (DESYREL) 50 MG tablet TAKE ONE TO THREE TABLETS BY MOUTH AT BEDTIME   ursodiol (ACTIGALL)  300 MG capsule Take 1 capsule (300 mg total) by mouth 2 (two) times daily.   ondansetron (ZOFRAN) 4 MG tablet Take 1 tablet (4 mg total) by mouth every 8 (eight) hours as needed for nausea or vomiting. (Patient not taking: Reported on 05/24/2023)   No facility-administered encounter medications on file as of 05/24/2023.    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   BREAST BIOPSY Left 2014   BREAST SURGERY Bilateral 1984   reduction mammoplasty   CARPAL TUNNEL RELEASE     CESAREAN SECTION     JOINT REPLACEMENT     REDUCTION MAMMAPLASTY Bilateral    ROTATOR CUFF REPAIR     TONSILLECTOMY     TOTAL KNEE ARTHROPLASTY Left 03/25/2019   Procedure: Left Knee Arthroplasty;  Surgeon: Marcene Corning, MD;  Location: [facility];  Service: Orthopedics;  Laterality: Left;   VAGINAL HYSTERECTOMY     Family History  Problem Relation Age of Onset   Arthritis Mother    Early death Father    Diabetes Father    Alcohol abuse Father    COPD Father    Diabetes Mellitus II Father    Heart attack Father    Mental illness Father    Alcohol abuse Sister    Stroke Maternal Grandmother    Stroke Maternal Grandfather    Diabetes Maternal Grandfather    Diabetes Paternal  Grandfather    Depression Daughter    Hypotension Daughter        POTS   Colon cancer Neg Hx    Liver cancer Neg Hx    Stomach cancer Neg Hx    Esophageal cancer Neg Hx    Rectal cancer Neg Hx    Breast cancer Neg Hx    Social History   Socioeconomic History   Marital status: Married    Spouse name: Hessie Diener   Number of children: 2   Years of education: Not on file   Highest education level: Associate degree: occupational, Scientist, product/process development, or vocational program  Occupational History   Occupation: DOSHER  Tobacco Use   Smoking status: Former  Current packs/day: 0.00    Average packs/day: 1 pack/day for 25.0 years (25.0 ttl pk-yrs)    Types: Cigarettes    Start date: 09/01/1981    Quit date: 09/01/2006    Years since quitting: 16.7   Smokeless tobacco: Never  Vaping Use   Vaping status: Never Used  Substance and Sexual Activity   Alcohol use: Yes    Comment: occ   Drug use: No   Sexual activity: Not Currently  Other Topics Concern   Not on file  Social History Narrative   Married 1979.   2 daughters.   4 grandkids.   Retired Charity fundraiser   From [employer].   Social Determinants of Health   Financial Resource Strain: Low Risk  (05/24/2023)   Overall Financial Resource Strain (CARDIA)    Difficulty of Paying Living Expenses: Not hard at all  Food Insecurity: No Food Insecurity (05/24/2023)   Hunger Vital Sign    Worried About Running Out of Food in the Last Year: Never true    Ran Out of Food in the Last Year: Never true  Transportation Needs: No Transportation Needs (05/24/2023)   PRAPARE - Administrator, Civil Service (Medical): No    Lack of Transportation (Non-Medical): No  Physical Activity: Insufficiently Active (05/24/2023)   Exercise Vital Sign    Days of Exercise per Week: 2 days    Minutes of Exercise per Session: 20 min  Stress: Stress Concern Present (05/24/2023)   [employer] of Occupational Health - Occupational Stress  Questionnaire    Feeling of Stress : To some extent  Social Connections: Socially Integrated (05/24/2023)   Social Connection and Isolation Panel [NHANES]    Frequency of Communication with Friends and Family: More than three times a week    Frequency of Social Gatherings with Friends and Family: Once a week    Attends Religious Services: More than 4 times per year    Active Member of [employer] or Organizations: Yes    Attends Engineer, structural: More than 4 times per year    Marital Status: Married    Tobacco Counseling Counseling given: Not Answered   Clinical Intake:  Pre-visit preparation completed: No  Pain : 0-10 Pain Score: 2  Pain Type: Chronic pain Pain Location: Hand Pain Descriptors / Indicators: Aching Pain Onset: More than a month ago Pain Frequency: Intermittent Pain Relieving Factors: IBU, moving  Pain Relieving Factors: IBU, moving  BMI - recorded: 32.19 Nutritional Status: BMI > 30  Obese Nutritional Risks: None Diabetes: Yes CBG done?: No Did pt. bring in CBG monitor from home?: No  How often do you need to have someone help you when you read instructions, pamphlets, or other written materials from your doctor or pharmacy?: 1 - Never  Interpreter Needed?: No  Comments: lives with husband Information entered by :: B.Jaelie Aguilera,LPN   Activities of Daily Living    05/24/2023    8:50 AM  In your present state of health, do you have any difficulty performing the following activities:  Hearing? 1  Vision? 0  Difficulty concentrating or making decisions? 0  Walking or climbing stairs? 0  Dressing or bathing? 0  Doing errands, shopping? 0  Preparing Food and eating ? N  Using the Toilet? N  In the past six months, have you accidently leaked urine? N  Do you have problems with loss of bowel control? N  Managing your Medications? N  Managing your Finances? N  Housekeeping or  managing your Housekeeping? N    Patient Care Team: Joaquim Nam, MD as PCP - General (Family Medicine) Parke Poisson, MD as Consulting Physician (Cardiology) Arita Miss Wendy Poet, MD as Consulting Physician (Plastic Surgery) Maris Berger, MD as Consulting Physician (Ophthalmology)  Indicate any recent Medical Services you may have received from other than [facility] providers in the past year (date may be approximate).     Assessment:   This is a routine wellness examination for Lan.  Hearing/Vision screen Hearing Screening - Comments:: Pt says she does not hear as well as she has in past:declines referral to audiology Vision Screening - Comments:: Pt says she has had cataract surgery:only wears readers only Dr Ulla Gallo Opthal   Goals Addressed             This Visit's Progress    Weight (lb) < 200 lb (90.7 kg)   176 lb (79.8 kg)    I would like to lose 15lb.       Depression Screen    05/24/2023    8:43 AM 03/29/2023    9:09 AM 12/26/2022    2:10 PM 10/24/2022   11:47 AM 09/05/2022   11:37 AM 05/23/2022    9:20 AM 02/23/2022    2:25 PM  PHQ 2/9 Scores  PHQ - 2 Score 0 0 0 1 0 0 0  PHQ- 9 Score  0 0 7 4      Fall Risk    05/24/2023    8:35 AM 03/29/2023    9:08 AM 12/26/2022    2:10 PM 10/24/2022   11:47 AM 09/05/2022   11:37 AM  Fall Risk   Falls in the past year? 0 0 0 0 0  Number falls in past yr: 0 0 0 0 0  Injury with Fall? 0 0 0 0 0  Risk for fall due to : No Fall Risks No Fall Risks No Fall Risks No Fall Risks No Fall Risks  Follow up Education provided;Falls prevention discussed Falls evaluation completed Falls evaluation completed Falls evaluation completed Falls evaluation completed    MEDICARE RISK AT HOME: Medicare Risk at Home Any stairs in or around the home?: Yes If so, are there any without handrails?: No (3 steps out) Home free of loose throw rugs in walkways, pet beds, electrical cords, etc?: Yes Adequate lighting in your home to reduce risk of falls?: Yes Life alert?: No Use of a cane,  walker or w/c?: No Grab bars in the bathroom?: No Shower chair or bench in shower?: No Elevated toilet seat or a handicapped toilet?: Yes  TIMED UP AND GO:  Was the test performed?  No    Cognitive Function:        05/24/2023    8:55 AM 05/23/2022    9:22 AM  6CIT Screen  What Year? 0 points 0 points  What month? 0 points 0 points  What time? 0 points 0 points  Count back from 20 0 points 0 points  Months in reverse 0 points 0 points  Repeat phrase 0 points 2 points  Total Score 0 points 2 points    Immunizations Immunization History  Administered Date(s) Administered   Fluad Quad(high Dose 65+) 04/18/2022   Fluad Trivalent(High Dose 65+) 03/29/2023   Influenza Split 04/01/2016   Influenza, High Dose Seasonal PF 04/19/2018, 05/15/2019   Influenza,inj,Quad PF,6+ Mos 04/02/2017   Influenza-Unspecified 04/12/2015, 05/09/2016   Moderna Covid-19 Vaccine Bivalent Booster 26yrs & up 05/06/2021   Gala Murdoch  Sars-Covid-2 Vaccination 07/24/2019, 08/22/2019, 05/05/2020   Pneumococcal Conjugate-13 07/12/1998   Pneumococcal Polysaccharide-23 05/26/2022   Tdap 03/11/2012, 04/18/2022   Zoster Recombinant(Shingrix) 08/30/2020    TDAP status: Up to date  Flu Vaccine status: Up to date  Pneumococcal vaccine status: Up to date  Covid-19 vaccine status: Completed vaccines  Qualifies for Shingles Vaccine? Yes   Zostavax completed Yes   Shingrix Completed?: Yes  Screening Tests Health Maintenance  Topic Date Due   Lung Cancer Screening  02/13/2019   Zoster Vaccines- Shingrix (2 of 2) 10/25/2020   Hepatitis C Screening  05/23/2024 (Originally 06/30/1969)   OPHTHALMOLOGY EXAM  08/18/2023   Diabetic kidney evaluation - Urine ACR  09/06/2023   HEMOGLOBIN A1C  09/26/2023   Diabetic kidney evaluation - eGFR measurement  12/22/2023   FOOT EXAM  03/28/2024   Medicare Annual Wellness (AWV)  05/23/2024   MAMMOGRAM  05/26/2024   Colonoscopy  08/04/2029   DTaP/Tdap/Td (3 - Td or Tdap)  04/18/2032   Pneumonia Vaccine 21+ Years old  Completed   INFLUENZA VACCINE  Completed   DEXA SCAN  Completed   HPV VACCINES  Aged Out   COVID-19 Vaccine  Discontinued    Health Maintenance  Health Maintenance Due  Topic Date Due   Lung Cancer Screening  02/13/2019   Zoster Vaccines- Shingrix (2 of 2) 10/25/2020    Colorectal cancer screening: Type of screening: Colonoscopy. Completed 08/05/2019. Repeat every 10 years  Mammogram status: Completed 06/06/23. Repeat every year  Bone Density status: Completed 09/18/2019. Results reflect: Bone density results: NORMAL. Repeat every 5 years.  Lung Cancer Screening: (Low Dose CT Chest recommended if Age 66-80 years, 20 pack-year currently smoking OR have quit w/in 15years.) does not qualify.   Lung Cancer Screening Referral: no  Additional Screening:  Hepatitis C Screening: does not qualify; Completed no  Vision Screening: Recommended annual ophthalmology exams for early detection of glaucoma and other disorders of the eye. Is the patient up to date with their annual eye exam?  Yes  Who is the provider or what is the name of the office in which the patient attends annual eye exams? Dr Precious Haws If pt is not established with a provider, would they like to be referred to a provider to establish care? No .   Dental Screening: Recommended annual dental exams for proper oral hygiene  Diabetic Foot Exam: Diabetic Foot Exam: Completed 03/29/23  Community Resource Referral / Chronic Care Management: CRR required this visit?  No   CCM required this visit?  No     Plan:     I have personally reviewed and noted the following in the patient's chart:   Medical and social history Use of alcohol, tobacco or illicit drugs  Current medications and supplements including opioid prescriptions. Patient is not currently taking opioid prescriptions. Functional ability and status Nutritional status Physical activity Advanced directives List of  other physicians Hospitalizations, surgeries, and ER visits in previous 12 months Vitals Screenings to include cognitive, depression, and falls Referrals and appointments  In addition, I have reviewed and discussed with patient certain preventive protocols, quality metrics, and best practice recommendations. A written personalized care plan for preventive services as well as general preventive health recommendations were provided to patient.     Sue Lush, LPN   [id number]/04/[id number]   After Visit Summary: (MyChart) Due to this being a telephonic visit, the after visit summary with patients personalized plan was offered to patient via MyChart   Nurse Notes:  The patient states she is doing alright. She has a little stress with some family issues but is coping alright right now (placement of sister in law in facility). She has no concerns or questions at this time.

## 2023-05-24 NOTE — Patient Instructions (Signed)
Natasha Franco , Thank you for taking time to come for your Medicare Wellness Visit. I appreciate your ongoing commitment to your health goals. Please review the following plan we discussed and let me know if I can assist you in the future.   Referrals/Orders/Follow-Ups/Clinician Recommendations: none  This is a list of the screening recommended for you and due dates:  Health Maintenance  Topic Date Due   Hepatitis C Screening  Never done   Screening for Lung Cancer  02/13/2019   Zoster (Shingles) Vaccine (2 of 2) 10/25/2020   Eye exam for diabetics  08/18/2023   Yearly kidney health urinalysis for diabetes  09/06/2023   Hemoglobin A1C  09/26/2023   Yearly kidney function blood test for diabetes  12/22/2023   Complete foot exam   03/28/2024   Medicare Annual Wellness Visit  05/23/2024   Mammogram  05/26/2024   Colon Cancer Screening  08/04/2029   DTaP/Tdap/Td vaccine (3 - Td or Tdap) 04/18/2032   Pneumonia Vaccine  Completed   Flu Shot  Completed   DEXA scan (bone density measurement)  Completed   HPV Vaccine  Aged Out   COVID-19 Vaccine  Discontinued    Advanced directives: (Copy Requested) Please bring a copy of your health care power of attorney and living will to the office to be added to your chart at your convenience.  Next Medicare Annual Wellness Visit scheduled for next year: Yes 05/26/24 @ 8:10am telephone

## 2023-06-06 ENCOUNTER — Ambulatory Visit
Admission: RE | Admit: 2023-06-06 | Discharge: 2023-06-06 | Disposition: A | Discharge status: Home / Self Care | Payer: Medicare PPO | Source: Ambulatory Visit | Attending: Family Medicine | Admitting: Family Medicine

## 2023-06-06 DIAGNOSIS — Z1231 Encounter for screening mammogram for malignant neoplasm of breast: Secondary | ICD-10-CM | POA: Diagnosis not present

## 2023-06-12 ENCOUNTER — Other Ambulatory Visit: Payer: Self-pay | Admitting: Family Medicine

## 2023-06-12 ENCOUNTER — Encounter: Payer: Self-pay | Admitting: Family Medicine

## 2023-06-12 ENCOUNTER — Other Ambulatory Visit: Payer: Self-pay

## 2023-06-12 MED ORDER — MONTELUKAST SODIUM 10 MG PO TABS
10.0000 mg | ORAL_TABLET | Freq: Every day | ORAL | 1 refills | Status: DC
  Filled 2023-06-12: qty 90, 90d supply, fill #0
  Filled 2023-09-06: qty 90, 90d supply, fill #1

## 2023-06-12 MED FILL — Pantoprazole Sodium EC Tab 40 MG (Base Equiv): ORAL | 90 days supply | Qty: 180 | Fill #0 | Status: AC

## 2023-06-13 ENCOUNTER — Other Ambulatory Visit: Payer: Self-pay

## 2023-06-13 MED ORDER — TRAZODONE HCL 50 MG PO TABS
50.0000 mg | ORAL_TABLET | Freq: Every day | ORAL | 3 refills | Status: DC
  Filled 2023-06-13: qty 90, 90d supply, fill #0
  Filled 2023-09-18: qty 90, 90d supply, fill #1
  Filled 2023-12-13: qty 90, 90d supply, fill #2
  Filled 2024-03-18: qty 90, 90d supply, fill #3

## 2023-06-13 NOTE — Progress Notes (Signed)
[facility] - Clinic Note  06/20/2023     CHIEF COMPLAINT Patient presents for Retina Follow Up   HISTORY OF PRESENT ILLNESS: Natasha Franco is a 72 y.o. female who presents to the clinic today for:   HPI     Retina Follow Up   Patient presents with  Other.  In right eye.  Severity is moderate.  Duration of 3 months.  Since onset it is stable.  I, the attending physician,  performed the HPI with the patient and updated documentation appropriately.        Comments   Pt here for 3 mo ret f/u CME OD. Pt is not currently taking any gtts. Feels VA in OS is a bit blurry, noticed today.       Last edited by Rennis Chris, MD on 06/22/2023  1:01 AM.    Pt states vision is good, she is using OTC readers, pt saw Dr. Charlotte Sanes yesterday  Referring physician: Maris Berger MD [address] [place], [place] [address]  HISTORICAL INFORMATION:   Selected notes from the MEDICAL RECORD NUMBER Referred by Dr. Charlotte Sanes for retinoschisis OD LEE:  Ocular Hx- HH plaque X2 OS (2018), cataracts, progressive retinoschisis OD PMH-    CURRENT MEDICATIONS: No current outpatient medications on file. (Ophthalmic Drugs)   No current facility-administered medications for this visit. (Ophthalmic Drugs)   Current Outpatient Medications (Other)  Medication Sig   Accu-Chek Softclix Lancets lancets Use to check blood sugar daily. Dx E11.9   ALPRAZolam (XANAX) 0.5 MG tablet Take 1 tablet (0.5 mg total) by mouth 2 (two) times daily as needed.   azelastine (ASTELIN) 0.1 % nasal spray Place 2 sprays into both nostrils 2 (two) times daily. Use in each nostril as directed/PRN   Blood Glucose Monitoring Suppl (ACCU-CHEK GUIDE) w/Device KIT Use to check blood sugar daily. Dx E11.9   Dulaglutide (TRULICITY) 0.75 MG/0.5ML SOPN Inject 0.75 mg into the skin once a week.   empagliflozin (JARDIANCE) 25 MG TABS tablet Take 1 tablet (25 mg total) by mouth daily.   escitalopram (LEXAPRO) 20 MG  tablet Take 1 tablet (20 mg total) by mouth at bedtime. (Patient not taking: Reported on 06/21/2023)   estradiol (ESTRACE VAGINAL) 0.1 MG/GM vaginal cream Place 1 Applicatorful vaginally 3 (three) times a week.   Evolocumab (REPATHA SURECLICK) 140 MG/ML SOAJ Inject 140 mg into the skin every 14 (fourteen) days.   fluticasone (FLONASE) 50 MCG/ACT nasal spray Place 2 sprays into both nostrils 2 (two) times daily. PRN   fluticasone-salmeterol (WIXELA INHUB) 250-50 MCG/ACT AEPB Inhale 1 puff into the lungs in the morning and at bedtime. PRN   glucose blood test strip Use as instructed to check blood sugar daily. Dx E11.9   ibuprofen (IBU) 800 MG tablet Take 1 tablet (800 mg total) by mouth 3 (three) times daily with meals as needed.   ipratropium (ATROVENT) 0.06 % nasal spray Place 2 sprays into both nostrils 3 (three) times daily. (Patient taking differently: Place 2 sprays into both nostrils 3 (three) times daily. PRN)   levocetirizine (XYZAL) 5 MG tablet Take 1 tablet (5 mg total) by mouth every evening.   montelukast (SINGULAIR) 10 MG tablet Take 1 tablet (10 mg total) by mouth at bedtime.   ondansetron (ZOFRAN) 4 MG tablet Take 1 tablet (4 mg total) by mouth every 8 (eight) hours as needed for nausea or vomiting. (Patient taking differently: Take 4 mg by mouth every 8 (eight) hours as needed  for nausea or vomiting. PRN)   pantoprazole (PROTONIX) 40 MG tablet Take 1 tablet (40 mg total) by mouth 2 (two) times daily 30 minutes before meals.   topiramate (TOPAMAX) 100 MG tablet TAKE 1 TABLET BY MOUTH TWICE DAILY   traZODone (DESYREL) 50 MG tablet Take 1 tablet (50 mg total) by mouth at bedtime.   ursodiol (ACTIGALL) 300 MG capsule Take 1 capsule (300 mg total) by mouth 2 (two) times daily.   colchicine 0.6 MG tablet Take 1 tablet (0.6 mg total) by mouth daily as needed.   No current facility-administered medications for this visit. (Other)   REVIEW OF SYSTEMS: ROS   Positive for: Gastrointestinal,  Neurological, Musculoskeletal, Endocrine, Eyes Negative for: Constitutional, Skin, Genitourinary, HENT, Cardiovascular, Respiratory, Psychiatric, Allergic/Imm, Heme/Lymph Last edited by Thompson Grayer, COT on 06/20/2023 12:43 PM.      ALLERGIES Allergies  Allergen Reactions   Invokana [Canagliflozin]     VAGINAL YEAST INFECTIONS   Metformin And Related     GI upset   Ozempic (0.25 Or 0.5 Mg-Dose) [Semaglutide(0.25 Or 0.5mg -Dos)]     GI intolerance.  Has tolerated trulicity 0.75mg  per dose.    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   BREAST BIOPSY Left 2014   BREAST SURGERY Bilateral 1984   reduction mammoplasty   CARPAL TUNNEL RELEASE     CESAREAN SECTION     JOINT REPLACEMENT     REDUCTION MAMMAPLASTY Bilateral    ROTATOR CUFF REPAIR     TONSILLECTOMY     TOTAL KNEE ARTHROPLASTY Left 03/25/2019   Procedure: Left Knee Arthroplasty;  Surgeon: Marcene Corning, MD;  Location: [facility];  Service: Orthopedics;  Laterality: Left;   VAGINAL HYSTERECTOMY     FAMILY HISTORY Family History  Problem Relation Age of Onset   Arthritis Mother    Early death Father    Diabetes Father    Alcohol abuse Father    COPD Father    Diabetes Mellitus II Father    Heart attack Father    Mental illness Father    Alcohol  abuse Sister    Stroke Maternal Grandmother    Stroke Maternal Grandfather    Diabetes Maternal Grandfather    Diabetes Paternal Grandfather    Depression Daughter    Hypotension Daughter        POTS   Colon cancer Neg Hx    Liver cancer Neg Hx    Stomach cancer Neg Hx    Esophageal cancer Neg Hx    Rectal cancer Neg Hx    Breast cancer Neg Hx    SOCIAL HISTORY Social History   Tobacco  Use   Smoking status: Former    Current packs/day: 0.00    Average packs/day: 1 pack/day for 25.0 years (25.0 ttl pk-yrs)    Types: Cigarettes    Start date: 09/01/1981    Quit date: 09/01/2006    Years since quitting: 16.8   Smokeless tobacco: Never  Vaping Use   Vaping status: Never Used  Substance Use Topics   Alcohol use: Yes    Comment: occ   Drug use: No       OPHTHALMIC EXAM:  Base Eye Exam     Visual Acuity (Snellen - Linear)       Right Left   Dist [place] 20/25 20/25 +2   Dist ph [place] 20/25 +1 20/20 -1         Tonometry (Tonopen, 12:49 PM)       Right Left   Pressure 15 12         Pupils       Dark Light Shape React APD   Right 3 2 Round Brisk None   Left 3 2 Round Brisk None         Visual Fields (Counting fingers)       Left Right    Full Full         Extraocular Movement       Right Left    Full, Ortho Full, Ortho         Neuro/Psych     Oriented x3: Yes   Mood/Affect: Normal         Dilation     Both eyes: 1.0% Mydriacyl, 2.5% Phenylephrine @ 12:50 PM           Slit Lamp and Fundus Exam     Slit Lamp Exam       Right Left   Lids/Lashes Dermatochalasis - upper lid Dermatochalasis - upper lid, mild MGD, Telangiectasia   Conjunctiva/Sclera White and quiet, trace STK ST quad, temporal pinguecula White and quiet   Cornea well healed cataract wound, trace tear film debris Trace PEE, tear film debris, well healed cataract wound   Anterior Chamber deep and clear, no cell or flare deep and clear, no cell or flare   Iris Round and  dilated Round and dilated   Lens PC IOL in good position PC IOL in good position   Anterior Vitreous Vitreous syneresis, 0.5+fine pigment, Posterior vitreous detachment, pigmented debris settled inferiory, vitreous condensations Vitreous syneresis, no pigment, Posterior vitreous detachment, vitreous condensations         Fundus Exam       Right Left   Disc Pink and Sharp, mild tilt, inferior PPA Pink and Sharp, tilted, inferior PPA   C/D Ratio 0.5 0.5   Macula Flat, Blunted foveal reflex, trace cystic changes -- stably improved, mild RPE mottling greatest centrally, No heme, no frank edema Flat, Blunted foveal reflex, mild RPE mottling, No heme or edema   Vessels attenuated, mild tortuosity, mild AV crossing changes attenuated, mild tortuosity   Periphery Attached, inferior schisis from 0400-0730 with pigment clumping and pigment atrophy within schisis cavity, outer retinal hole at 0700 -- good barricade laser changes posterior to schisis, no new RT/RD/lattice or schisis Attached, very shallow schisis IT periphery, mild reticular degeneration           IMAGING AND PROCEDURES  Imaging and Procedures for 06/20/2023  OCT, Retina - OU - Both Eyes       Right Eye Quality was good. Central Foveal Thickness: 315. Progression  has improved. Findings include normal foveal contour, no IRF, no SRF (Interval improvement in cystic changes inferior macula, mild patchy ellipsoid signal centrally, IT schisis with outer retinal holes caught on widefield -- not imaged today).   Left Eye Quality was good. Central Foveal Thickness: 280. Progression has been stable. Findings include normal foveal contour, no IRF, no SRF, vitreomacular adhesion (Shallow schisis IT periphery caught on widefield - not imaged today).   Notes *Images captured and stored on drive  Diagnosis / Impression:  OD: Interval improvement in cystic changes inferior macula, mild patchy ellipsoid signal centrally, IT schisis with  outer retinal holes caught on widefield -- not imaged today OS: NFP; no IRF/SRF; Shallow schisis IT periphery caught on widefield -- not imaged today  Clinical management:  See below  Abbreviations: NFP - Normal foveal profile. CME - cystoid macular edema. PED - pigment epithelial detachment. IRF - intraretinal fluid. SRF - subretinal fluid. EZ - ellipsoid zone. ERM - epiretinal membrane. ORA - outer retinal atrophy. ORT - outer retinal tubulation. SRHM - subretinal hyper-reflective material. IRHM - intraretinal hyper-reflective material            ASSESSMENT/PLAN:    ICD-10-CM   1. 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 Diabetes mellitus type 2 without retinopathy (HCC)  E11.9     5. Long term (current) use of oral hypoglycemic drugs  Z79.84     6. Long-term (current) use of injectable non-insulin antidiabetic drugs  Z79.85     7. Pseudophakia, both eyes  Z96.1     8. Dry eyes  H04.123      1. CME OD - development of IRF and sliver of SRF centrally -- consistent with CME, noted on 07.31.23 visit - suspect delayed [place], s/p CEIOL with Dr. Charlotte Sanes in March 2023 - started on PF and Ketorolac QID OD on 07.31.23 - recurrent CME following taper after 08.22.23 visit - s/p STK OD #1 (11.15.23) - today, OCT shows OD: Interval improvement in cystic changes inferior macula, mild patchy ellipsoid signal centrally, IT schisis with outer retinal holes caught on widefield -- not imaged today - BCVA OD 20/25 from 20/20 - pt stopped using PF in July -- CME remains stably resolved today - STK informed consent obtained and signed, 11.15.23 (OD)  - pt is cleared from a retina standpoint for release to Dr. Charlotte Sanes and resumption of primary eye care  2,3. Retinoschisis OU - OD: inferior schisis 0400-0730 with pigment clumping and pigment atrophy within schisis  cavity, outer retinal hole at 0700 - OS: very shallow schisis IT arcades  - pt reports intermittent photopsias and floaters OU -- likely unrelated to schisis - s/p laser retinopexy OD (01.30.23) -- good barricade laser changes posterior to schisis - no new RT/RD or schisis OU - monitor  4-6. Diabetes mellitus, type 2 without retinopathy  - last A1c was 6.5 on 02.27.24 - The incidence, risk factors for progression, natural history and treatment options for diabetic retinopathy  were discussed with patient.   - The need for close monitoring of blood glucose, blood pressure, and serum lipids, avoiding cigarette or any type of tobacco, and the need for long term follow up was also discussed with patient. - f/u in 1 year, sooner prn  7. Pseudophakia OU  - s/p CE/IOL OU (  Dr. Charlotte Sanes, March / April 2023)  - IOLs in perfect position, doing well  - mild CME OD stably resolved as above  - monitor  8. Dry eyes OU (OD > OS) - recommend artificial tears and lubricating ointment as needed   Ophthalmic Meds Ordered this visit:  No orders of the defined types were placed in this encounter.    Return if symptoms worsen or fail to improve.  There are no Patient Instructions on file for this visit.   Explained the diagnoses, plan, and follow up with the patient and they expressed understanding.  Patient expressed understanding of the importance of proper follow up care.   This document serves as a record of services personally performed by Karie Chimera, MD, PhD. It was created on their behalf by De Blanch, an ophthalmic technician. The creation of this record is the provider's dictation and/or activities during the visit.    Electronically signed by: De Blanch, OA, 06/22/23  1:01 AM  This document serves as a record of services personally performed by Karie Chimera, MD, PhD. It was created on their behalf by Glee Arvin. Manson Passey, OA an ophthalmic technician. The creation of this record  is the provider's dictation and/or activities during the visit.    Electronically signed by: Glee Arvin. Manson Passey, OA 06/22/23 1:01 AM  Karie Chimera, M.D., Ph.D. Diseases & Surgery of the Retina and Vitreous [facility]  I have reviewed the above documentation for accuracy and completeness, and I agree with the above. Karie Chimera, M.D., Ph.D. 06/22/23 1:04 AM   Abbreviations: M myopia (nearsighted); A astigmatism; H hyperopia (farsighted); P presbyopia; Mrx spectacle prescription;  CTL contact lenses; OD right eye; OS left eye; OU both eyes  XT exotropia; ET esotropia; PEK punctate epithelial keratitis; PEE punctate epithelial erosions; DES dry eye syndrome; MGD meibomian gland dysfunction; ATs artificial tears; PFAT's preservative free artificial tears; NSC nuclear sclerotic cataract; PSC posterior subcapsular cataract; ERM epi-retinal membrane; PVD posterior vitreous detachment; RD retinal detachment; DM diabetes mellitus; DR diabetic retinopathy; NPDR non-proliferative diabetic retinopathy; PDR proliferative diabetic retinopathy; CSME clinically significant macular edema; DME diabetic macular edema; dbh dot blot hemorrhages; CWS cotton wool spot; POAG primary open angle glaucoma; C/D cup-to-disc ratio; HVF humphrey visual field; GVF goldmann visual field; OCT optical coherence tomography; IOP intraocular pressure; BRVO Branch retinal vein occlusion; CRVO central retinal vein occlusion; CRAO central retinal artery occlusion; BRAO branch retinal artery occlusion; RT retinal tear; SB scleral buckle; PPV pars plana vitrectomy; VH Vitreous hemorrhage; PRP panretinal laser photocoagulation; IVK intravitreal kenalog; VMT vitreomacular traction; MH Macular hole;  NVD neovascularization of the disc; NVE neovascularization elsewhere; AREDS age related eye disease study; ARMD age related macular degeneration; POAG primary open angle glaucoma; EBMD epithelial/anterior basement membrane  dystrophy; ACIOL anterior chamber intraocular lens; IOL intraocular lens; PCIOL posterior chamber intraocular lens; Phaco/IOL phacoemulsification with intraocular lens placement; PRK photorefractive keratectomy; LASIK laser assisted in situ keratomileusis; HTN hypertension; DM diabetes mellitus; COPD chronic obstructive pulmonary disease

## 2023-06-14 ENCOUNTER — Encounter: Payer: Self-pay | Admitting: Family Medicine

## 2023-06-17 ENCOUNTER — Telehealth: Payer: Self-pay | Admitting: Family Medicine

## 2023-06-17 NOTE — Telephone Encounter (Signed)
Please triage patient about hand pain, schedule if needed.  She is likely going to need an office visit, potentially with labs.  Thanks.

## 2023-06-18 NOTE — Telephone Encounter (Signed)
I spoke with pt; pt said this started "some time ago" pt could not be specific. Pain and swelling primarily in rt forefinger and middle finger; no redness or warmth.pt said pain has worsened than when first started and pt wondered if could be gout.. pt is very busy and could not schedule appt prior to 06/21/23. Pt scheduled appt with Dr Para March on 06/21/23 at 10:30 AM (pt was offered sooner appt but 06/21/23 was first available appt pt could schedule.) UC & ED precautions given and pt voiced understanding. Sending note to Dr Para March.

## 2023-06-18 NOTE — Telephone Encounter (Signed)
Agree. Thanks

## 2023-06-19 DIAGNOSIS — H40023 Open angle with borderline findings, high risk, bilateral: Secondary | ICD-10-CM | POA: Diagnosis not present

## 2023-06-20 ENCOUNTER — Ambulatory Visit (INDEPENDENT_AMBULATORY_CARE_PROVIDER_SITE_OTHER): Payer: Medicare PPO | Admitting: Ophthalmology

## 2023-06-20 ENCOUNTER — Encounter (INDEPENDENT_AMBULATORY_CARE_PROVIDER_SITE_OTHER): Payer: Self-pay | Admitting: Ophthalmology

## 2023-06-20 DIAGNOSIS — H04123 Dry eye syndrome of bilateral lacrimal glands: Secondary | ICD-10-CM

## 2023-06-20 DIAGNOSIS — Z7985 Long-term (current) use of injectable non-insulin antidiabetic drugs: Secondary | ICD-10-CM

## 2023-06-20 DIAGNOSIS — E119 Type 2 diabetes mellitus without complications: Secondary | ICD-10-CM | POA: Diagnosis not present

## 2023-06-20 DIAGNOSIS — Z7984 Long term (current) use of oral hypoglycemic drugs: Secondary | ICD-10-CM

## 2023-06-20 DIAGNOSIS — H33321 Round hole, right eye: Secondary | ICD-10-CM | POA: Diagnosis not present

## 2023-06-20 DIAGNOSIS — Z961 Presence of intraocular lens: Secondary | ICD-10-CM | POA: Diagnosis not present

## 2023-06-20 DIAGNOSIS — H35351 Cystoid macular degeneration, right eye: Secondary | ICD-10-CM | POA: Diagnosis not present

## 2023-06-20 DIAGNOSIS — H33103 Unspecified retinoschisis, bilateral: Secondary | ICD-10-CM | POA: Diagnosis not present

## 2023-06-21 ENCOUNTER — Other Ambulatory Visit: Payer: Self-pay

## 2023-06-21 ENCOUNTER — Ambulatory Visit (INDEPENDENT_AMBULATORY_CARE_PROVIDER_SITE_OTHER)
Admission: RE | Admit: 2023-06-21 | Discharge: 2023-06-21 | Disposition: A | Discharge status: Home / Self Care | Payer: Medicare PPO | Source: Ambulatory Visit | Attending: Family Medicine | Admitting: Family Medicine

## 2023-06-21 ENCOUNTER — Ambulatory Visit: Payer: Medicare PPO | Admitting: Family Medicine

## 2023-06-21 ENCOUNTER — Encounter: Payer: Self-pay | Admitting: Family Medicine

## 2023-06-21 VITALS — BP 122/64 | HR 76 | Temp 98.4°F | Ht 62.0 in | Wt 184.4 lb

## 2023-06-21 DIAGNOSIS — M25549 Pain in joints of unspecified hand: Secondary | ICD-10-CM | POA: Diagnosis not present

## 2023-06-21 DIAGNOSIS — M1811 Unilateral primary osteoarthritis of first carpometacarpal joint, right hand: Secondary | ICD-10-CM | POA: Diagnosis not present

## 2023-06-21 DIAGNOSIS — M25541 Pain in joints of right hand: Secondary | ICD-10-CM | POA: Diagnosis not present

## 2023-06-21 DIAGNOSIS — E119 Type 2 diabetes mellitus without complications: Secondary | ICD-10-CM | POA: Diagnosis not present

## 2023-06-21 DIAGNOSIS — M79641 Pain in right hand: Secondary | ICD-10-CM | POA: Diagnosis not present

## 2023-06-21 LAB — BASIC METABOLIC PANEL
BUN: 19 mg/dL (ref 6–23)
CO2: 25 meq/L (ref 19–32)
Calcium: 9 mg/dL (ref 8.4–10.5)
Chloride: 102 meq/L (ref 96–112)
Creatinine, Ser: 0.72 mg/dL (ref 0.40–1.20)
GFR: 84.39 mL/min (ref >60.00)
Glucose, Bld: 162 mg/dL — ABNORMAL HIGH (ref 70–99)
Potassium: 4.2 meq/L (ref 3.5–5.1)
Sodium: 137 meq/L (ref 135–145)

## 2023-06-21 LAB — URIC ACID: Uric Acid, Serum: 5.2 mg/dL (ref 2.4–7.0)

## 2023-06-21 LAB — SEDIMENTATION RATE: Sed Rate: 30 mm/h (ref 0–30)

## 2023-06-21 MED ORDER — COLCHICINE 0.6 MG PO TABS
0.6000 mg | ORAL_TABLET | Freq: Every day | ORAL | 1 refills | Status: DC | PRN
  Filled 2023-06-21: qty 20, 20d supply, fill #0

## 2023-06-21 NOTE — Progress Notes (Signed)
Sig R>L hand pain, esp at the R 2nd and 3rd MCP, less so on the fingers distally and the hand proximally.  Puffy locally. No red.  Pain with ROM.  No h/o gout.  No trigger.  No foot sx.    Sugar has been low 200s recently.    She had tapered off lexapro.  She was considering restart but wanted to defer for now.  No SI/HI.    Meds, vitals, and allergies reviewed.   ROS: Per HPI unless specifically indicated in ROS section   Nad Ncat Rrr Ctab R hand with puffiness at the R 2nd and 3rd MCP w/o erythema.  She has pain on range of motion.  She has less swelling on the other metacarpals and IP joints. She has less swelling on the metacarpals in the left hand compared to the right hand. Distally normal capillary refill and sensation.

## 2023-06-21 NOTE — Patient Instructions (Addendum)
Go to the lab on the way out.   If you have mychart we'll likely use that to update you.    Try colchicine in the meantime.   Take care.  Glad to see you.

## 2023-06-22 ENCOUNTER — Encounter (INDEPENDENT_AMBULATORY_CARE_PROVIDER_SITE_OTHER): Payer: Self-pay | Admitting: Ophthalmology

## 2023-06-24 DIAGNOSIS — M25549 Pain in joints of unspecified hand: Secondary | ICD-10-CM | POA: Insufficient documentation

## 2023-06-24 NOTE — Assessment & Plan Note (Signed)
Unclear source.  Reasonable to try colchicine to see if it makes a significant difference in her symptoms in the meantime.  Check plain films and sed rate.  See notes on imaging/labs.  Okay for outpatient follow-up.

## 2023-06-24 NOTE — Assessment & Plan Note (Signed)
Sugars been elevated recently.  Avoid prednisone for now.  See notes on follow-up labs.

## 2023-06-28 ENCOUNTER — Other Ambulatory Visit: Payer: Self-pay

## 2023-06-28 ENCOUNTER — Ambulatory Visit: Payer: Medicare PPO | Admitting: Family Medicine

## 2023-07-01 ENCOUNTER — Other Ambulatory Visit: Payer: Self-pay | Admitting: Family Medicine

## 2023-07-01 ENCOUNTER — Other Ambulatory Visit: Payer: Self-pay

## 2023-07-01 MED ORDER — METFORMIN HCL 500 MG PO TABS
250.0000 mg | ORAL_TABLET | Freq: Every day | ORAL | 3 refills | Status: DC
  Filled 2023-07-01: qty 90, 90d supply, fill #0

## 2023-07-03 ENCOUNTER — Other Ambulatory Visit: Payer: Self-pay

## 2023-07-12 ENCOUNTER — Encounter: Payer: Self-pay | Admitting: Family Medicine

## 2023-07-12 ENCOUNTER — Other Ambulatory Visit: Payer: Self-pay

## 2023-07-12 ENCOUNTER — Ambulatory Visit: Payer: Medicare PPO | Admitting: Family Medicine

## 2023-07-12 VITALS — BP 108/64 | HR 76 | Temp 97.8°F | Ht 67.0 in

## 2023-07-12 DIAGNOSIS — E119 Type 2 diabetes mellitus without complications: Secondary | ICD-10-CM

## 2023-07-12 DIAGNOSIS — R350 Frequency of micturition: Secondary | ICD-10-CM | POA: Diagnosis not present

## 2023-07-12 DIAGNOSIS — Z794 Long term (current) use of insulin: Secondary | ICD-10-CM

## 2023-07-12 LAB — URINALYSIS, ROUTINE W REFLEX MICROSCOPIC
Bilirubin Urine: NEGATIVE
Ketones, ur: NEGATIVE
Nitrite: NEGATIVE
Specific Gravity, Urine: 1.015 (ref 1.000–1.030)
Total Protein, Urine: NEGATIVE
Urine Glucose: >=1000 — AB
Urobilinogen, UA: 0.2 (ref 0.0–1.0)
pH: 6.5 (ref 5.0–8.0)

## 2023-07-12 LAB — POCT GLYCOSYLATED HEMOGLOBIN (HGB A1C): Hemoglobin A1C: 9.2 % — AB (ref 4.0–5.6)

## 2023-07-12 MED ORDER — INSULIN PEN NEEDLE 30G X 5 MM MISC
3 refills | Status: DC
  Filled 2023-07-12: qty 100, 100d supply, fill #0
  Filled 2023-10-02: qty 100, 100d supply, fill #1
  Filled 2024-01-16 – 2024-01-17 (×2): qty 100, 100d supply, fill #2
  Filled 2024-03-18 – 2024-04-11 (×3): qty 100, 100d supply, fill #3

## 2023-07-12 MED ORDER — LANTUS SOLOSTAR 100 UNIT/ML ~~LOC~~ SOPN
5.0000 [IU] | PEN_INJECTOR | Freq: Every day | SUBCUTANEOUS | 99 refills | Status: DC
  Filled 2023-07-12: qty 15, 75d supply, fill #0
  Filled 2023-09-06: qty 15, 75d supply, fill #1

## 2023-07-12 MED ORDER — METFORMIN HCL 500 MG PO TABS: 250.0000 mg | ORAL_TABLET | Freq: Every day | ORAL | Status: DC

## 2023-07-12 NOTE — Progress Notes (Signed)
 Diabetes:  Sugar 176 this AM, but had been higher.  No lows.  D/w pt about med options and insulin  start. A1c 9.2.  D/w pt about med options.    Urinary frequently with odor.  No fevers.  D/w pt about checking u/a and ucx.  Meds, vitals, and allergies reviewed.   ROS: Per HPI unless specifically indicated in ROS section   GEN: nad, alert and oriented HEENT: ncat NECK: supple w/o LA CV: rrr. PULM: ctab, no inc wob EXT: no edema Abdomen not tender.

## 2023-07-12 NOTE — Patient Instructions (Signed)
 Let me know if you can't get lantus  filled.   Start with 5 units at night.    If your AM sugar is above 150, then add 1 unit to the next dose.   If 100-150, no change.  If below 100, then cut back 1 unit.   Update me in about 1 week, sooner if needed.   Try to take 1 full metformin  pill per day if tolerated.  Take care.  Glad to see you.

## 2023-07-13 ENCOUNTER — Other Ambulatory Visit: Payer: Self-pay

## 2023-07-14 LAB — URINE CULTURE
MICRO NUMBER:: 15910154
SPECIMEN QUALITY:: ADEQUATE

## 2023-07-15 ENCOUNTER — Other Ambulatory Visit: Payer: Self-pay | Admitting: Family Medicine

## 2023-07-15 ENCOUNTER — Encounter: Payer: Self-pay | Admitting: Family Medicine

## 2023-07-15 ENCOUNTER — Other Ambulatory Visit: Payer: Self-pay

## 2023-07-15 DIAGNOSIS — R350 Frequency of micturition: Secondary | ICD-10-CM | POA: Insufficient documentation

## 2023-07-15 MED ORDER — NITROFURANTOIN MONOHYD MACRO 100 MG PO CAPS
100.0000 mg | ORAL_CAPSULE | Freq: Two times a day (BID) | ORAL | 0 refills | Status: AC
  Filled 2023-07-15: qty 10, 5d supply, fill #0

## 2023-07-15 NOTE — Assessment & Plan Note (Signed)
 Unclear if from hyperglycemia.  See notes on labs.

## 2023-07-15 NOTE — Assessment & Plan Note (Signed)
 A1c elevated.  Insulin  start discussed with patient.  Diet and exercise discussed with patient. Start with 5 units of Lantus  at night.    If AM sugar is above 150, then add 1 unit to the next dose.   If 100-150, no change.  If below 100, then cut back 1 unit.   Update me in about 1 week, sooner if needed.   Try to take 1 full metformin  pill per day if tolerated.   Recheck periodically.

## 2023-07-16 ENCOUNTER — Other Ambulatory Visit: Payer: Self-pay

## 2023-07-19 ENCOUNTER — Other Ambulatory Visit: Payer: Self-pay

## 2023-07-19 DIAGNOSIS — J4520 Mild intermittent asthma, uncomplicated: Secondary | ICD-10-CM | POA: Diagnosis not present

## 2023-07-19 DIAGNOSIS — J300 Vasomotor rhinitis: Secondary | ICD-10-CM | POA: Diagnosis not present

## 2023-07-19 DIAGNOSIS — K219 Gastro-esophageal reflux disease without esophagitis: Secondary | ICD-10-CM | POA: Diagnosis not present

## 2023-07-19 DIAGNOSIS — H1045 Other chronic allergic conjunctivitis: Secondary | ICD-10-CM | POA: Diagnosis not present

## 2023-07-19 MED ORDER — AZELASTINE HCL 0.05 % OP SOLN
1.0000 [drp] | Freq: Two times a day (BID) | OPHTHALMIC | 6 refills | Status: DC
  Filled 2023-07-19: qty 6, 30d supply, fill #0

## 2023-07-26 ENCOUNTER — Ambulatory Visit: Payer: Medicare PPO | Admitting: Gastroenterology

## 2023-07-26 ENCOUNTER — Encounter: Payer: Self-pay | Admitting: Gastroenterology

## 2023-07-26 ENCOUNTER — Other Ambulatory Visit (INDEPENDENT_AMBULATORY_CARE_PROVIDER_SITE_OTHER): Payer: Medicare PPO

## 2023-07-26 VITALS — BP 110/70 | HR 76 | Ht 62.0 in | Wt 197.0 lb

## 2023-07-26 DIAGNOSIS — K743 Primary biliary cirrhosis: Secondary | ICD-10-CM

## 2023-07-26 DIAGNOSIS — K219 Gastro-esophageal reflux disease without esophagitis: Secondary | ICD-10-CM

## 2023-07-26 DIAGNOSIS — K760 Fatty (change of) liver, not elsewhere classified: Secondary | ICD-10-CM

## 2023-07-26 DIAGNOSIS — K2270 Barrett's esophagus without dysplasia: Secondary | ICD-10-CM

## 2023-07-26 DIAGNOSIS — Z79899 Other long term (current) drug therapy: Secondary | ICD-10-CM | POA: Diagnosis not present

## 2023-07-26 LAB — HEPATIC FUNCTION PANEL
ALT: 31 U/L (ref 0–35)
AST: 23 U/L (ref 0–37)
Albumin: 4.2 g/dL (ref 3.5–5.2)
Alkaline Phosphatase: 136 U/L — ABNORMAL HIGH (ref 39–117)
Bilirubin, Direct: 0.1 mg/dL (ref 0.0–0.3)
Total Bilirubin: 0.5 mg/dL (ref 0.2–1.2)
Total Protein: 7.6 g/dL (ref 6.0–8.3)

## 2023-07-26 NOTE — Patient Instructions (Signed)
If your blood pressure at your visit was 140/90 or greater, please contact your primary care physician to follow up on this. ______________________________________________________  If you are age 72 or older, your body mass index should be between 23-30. Your Body mass index is 36.03 kg/m. If this is out of the aforementioned range listed, please consider follow up with your Primary Care Provider.  If you are age 47 or younger, your body mass index should be between 19-25. Your Body mass index is 36.03 kg/m. If this is out of the aformentioned range listed, please consider follow up with your Primary Care Provider.  ________________________________________________________  The [facility] providers would like to encourage you to use [facility] to communicate with providers for non-urgent requests or questions.  Due to long hold times on the telephone, sending your provider a message by [facility] may be a faster and more efficient way to get a response.  Please allow 48 business hours for a response.  Please remember that this is for non-urgent requests.  _______________________________________________________  Due to recent changes in healthcare laws, you may see the results of your imaging and laboratory studies on MyChart before your provider has had a chance to review them.  We understand that in some cases there may be results that are confusing or concerning to you. Not all laboratory results come back in the same time frame and the provider may be waiting for multiple results in order to interpret others.  Please give [place] 48 hours in order for your provider to thoroughly review all the results before contacting the office for clarification of your results.   You have been scheduled for an abdominal ultrasound at [facility] (1st floor of hospital) on _____________________ at ______________. Please arrive 30 minutes prior to your appointment for registration. Make certain not to have anything  to eat or drink 6 hours prior to your appointment. Should you need to reschedule your appointment, please contact radiology at [phone]. This test typically takes about 30 minutes to perform.  Continue Protonix.  Continue ursodiol  Please go to the lab in the basement of our building to have lab work done as you leave today. Hit "B" for basement when you get on the elevator.  When the doors open the lab is on your left.  We will call you with the results. Thank you.  Thank you for entrusting me with your care and for choosing [facility], Dr. Ileene Patrick

## 2023-07-26 NOTE — Progress Notes (Signed)
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  She was last seen in March 2021.  Recall she has a history of Barrett's esophagus, short segment.  Last endoscopy January 2021.  3 cm hiatal hernia and 2 x 1 cm tongues of Barrett's esophagus without dysplasia.  She has been maintained on Protonix 40 mg twice daily for her GERD and Barrett's.  She has tried lower dosing of Protonix to once daily but this has not controlled her and she feels much better on twice daily dosing.  As long as she does not overeat, she states her symptoms are generally well-controlled and she is pretty happy with the current dosing of Protonix.  She has no dysphagia.  She was on higher dosing of Trulicity and Ozempic in the past for her diabetes and she states she just could not tolerate it as she felt as it caused gastroparesis and she had significant nausea vomiting on the drug.  She is currently on lower dosing of Trulicity which she tolerates well.  Unfortunately she has gained some weight over the past year, she thinks about 25 pounds, her A1c has risen to 9.2.  She had metformin added to her regimen as well as some Lantus insulin.  She is also on Jardiance.  Recall she has a history of fatty liver on ultrasound imaging and she also has suspected diagnosis of PBC.  In 2014 she had a persistent elevation in AP to the 200s, and markedly elevated AMA. She underwent a liver biopsy as below which showed some nonspecific inflammation, pathology wrote they did not see any evidence of obvious PBC. Given her labs she was placed on Ursodiol however and her AP had normalized.  She has been taking ursodiol since then and tolerates it well.  She is post to take 900 mg daily but states she only has been taking 600 mg daily which has been ongoing for years as she has difficulty remembering to take all the pills every day.  Her liver  enzymes have historically been normal however her alk phos has risen from 120s to 160s over the past year or 2 and ALT had gone up slightly to 41.  Last was checked in June 2024.  Last imaging of her liver was in 2021.  She is otherwise feeling well today without complaints.  She has struggled with control of her diabetes and weight again over the past year.  She had a DEXA scan in 2021 which was normal.   Prior workup: Colonoscopy 01/31/2010 - tortous colon, 2 sigmoid polyps, internal hemorrhoids - Dr. Juanda Chance   Liver biopsy 02/2013 - Final Pathologic Diagnosis   CORE NEEDLE BIOPSIES OF LIVER:   1.     CHRONIC HEPATITIS, TYPE UNDETERMINED.   2.     SCHEUER GRADE: 2, STAGE: 0   3.     MILD FATTY CHANGES.   4.     THERE IS NO SIGNIFICANT INCREASE IN PERIPORTAL FIBROUS CONNECTIVE TISSUE.   5.     NEGATIVE FOR A NEOPLASM.   6.     IRON STAIN IS NEGATIVE FOR PARENCHYMAL IRON.   7.     PLEASE SEE COMMENT.       Comment   The differential diagnosis includes non-alcoholic steatohepatitis (NASH),   alcohol related chronic hepatitis, drug induced hepatitis, autoimmune hepatitis,   and chronic viral hepatitis.  Results of any laboratory  studies and clinical   history could not be reviewed in the EPIC system at the time of review of the   slides.  There are no features of cirrhosis in the biopsies.  The etiology of   the hepatitis should be determined by clinical picture and results of various   laboratory studies. The pattern is not suggestive of primary biliary cirrhosis.     RUQ [place] 07/17/19 -  IMPRESSION: Fatty infiltration of the liver.   Normal DEXA 09/2019  EGD 08/05/19 -  - A 3 cm hiatal hernia was present. - There were esophageal mucosal changes classified as Barrett's stage C0-M1 per [place] criteria present in the lower third of the esophagus (2 tongues of salmon colored mucosa). The maximum longitudinal extent of these mucosal changes was roughly 1 cm in length. Biopsies obtained. -  The exam of the esophagus was otherwise normal. - The entire examined stomach was normal. Biopsies were taken with a cold forceps for Helicobacter pylori testing. - The duodenal bulb and second portion of the duodenum were normal.   Path shows BE   Colonoscopy 08/05/19 - The perianal and digital rectal examinations were normal. - A 3 mm polyp was found in the transverse colon. The polyp was sessile. The polyp was removed with a cold snare. Resection and retrieval were complete. - A 8 mm polyp was found in the descending colon. The polyp was flat. The polyp was removed with a cold snare. Resection and retrieval were complete. - Multiple small-mouthed diverticula were found in the sigmoid colon and ascending colon (mild in right colon, multiple noted in the left colon with superficial erythema). - Internal hemorrhoids were found during retroflexion. - The exam was otherwise without abnormality.   Benign path - no adenoma    Past Medical History:  Diagnosis Date   Arthritis    Barrett's esophagus    Chicken pox    Complication of anesthesia    Diabetes mellitus without complication (HCC)    Diverticulosis    GERD (gastroesophageal reflux disease)    Hay fever    Hepatitis    Hyperlipidemia    Per pt   Incontinent of urine    Migraines    Primary biliary cirrhosis (HCC)    suspected (AMA (+) but biopsy negative 2014)     Past Surgical History:  Procedure Laterality Date   BLADDER SUSPENSION     with mesh erosion in vaginal vault.   BLEPHAROPLASTY Bilateral    upper and lower   BREAST BIOPSY Left 2014   BREAST SURGERY Bilateral 1984   reduction mammoplasty   CARPAL TUNNEL RELEASE Right    CATARACT EXTRACTION     CESAREAN SECTION     x 2   FACIAL COSMETIC SURGERY     KNEE ARTHROSCOPY Left    REDUCTION MAMMAPLASTY Bilateral    ROTATOR CUFF REPAIR Right    TONSILLECTOMY     TOTAL KNEE ARTHROPLASTY Left 03/25/2019   Procedure: Left Knee Arthroplasty;  Surgeon:  Marcene Corning, MD;  Location: [facility];  Service: Orthopedics;  Laterality: Left;   VAGINAL HYSTERECTOMY     Family History  Problem Relation Age of Onset   Arthritis Mother    Early death Father    Diabetes Father    Alcohol abuse Father    COPD Father    Heart attack Father    Mental illness Father    Alcohol abuse Sister    Stroke Maternal Grandmother    Stroke Maternal Grandfather  Diabetes Maternal Grandfather    Diabetes Paternal Grandfather    Anxiety disorder Daughter    Hypotension Daughter        POTS   Colon cancer Neg Hx    Liver cancer Neg Hx    Stomach cancer Neg Hx    Esophageal cancer Neg Hx    Rectal cancer Neg Hx    Breast cancer Neg Hx    Social History   Tobacco Use   Smoking status: Former    Current packs/day: 0.00    Average packs/day: 1 pack/day for 25.0 years (25.0 ttl pk-yrs)    Types: Cigarettes    Start date: 09/01/1981    Quit date: 09/01/2006    Years since quitting: 16.9   Smokeless tobacco: Never  Vaping Use   Vaping status: Never Used  Substance Use Topics   Alcohol use: Yes    Comment: occ   Drug use: No   Current Outpatient Medications  Medication Sig Dispense Refill   Accu-Chek Softclix Lancets lancets Use to check blood sugar daily. Dx E11.9 100 each 12   albuterol (VENTOLIN HFA) 108 (90 Base) MCG/ACT inhaler INHALE 1 TO 2 PUFFS BY MOUTH EVERY 4 TO 6 HOURS AS NEEDED FOR COUGH OR WHEEZING     ALPRAZolam (XANAX) 0.5 MG tablet Take 1 tablet (0.5 mg total) by mouth 2 (two) times daily as needed. 60 tablet 1   azelastine (ASTELIN) 0.1 % nasal spray Place 2 sprays into both nostrils 2 (two) times daily. Use in each nostril as directed/PRN     azelastine (OPTIVAR) 0.05 % ophthalmic solution Instill 1 drop into affected eye 2 (two) times daily for 30 days. 6 mL 6   Blood Glucose Monitoring Suppl (ACCU-CHEK GUIDE) w/Device KIT Use to check blood sugar daily. Dx E11.9 1 kit 0   colchicine 0.6 MG tablet Take 1 tablet (0.6 mg total) by  mouth daily as needed. 20 tablet 1   Dulaglutide (TRULICITY) 0.75 MG/0.5ML SOPN Inject 0.75 mg into the skin once a week. 6 mL 3   empagliflozin (JARDIANCE) 25 MG TABS tablet Take 1 tablet (25 mg total) by mouth daily. 90 tablet 3   escitalopram (LEXAPRO) 20 MG tablet Take 1 tablet (20 mg total) by mouth at bedtime. 90 tablet 3   estradiol (ESTRACE VAGINAL) 0.1 MG/GM vaginal cream Place 1 Applicatorful vaginally 3 (three) times a week. 42.5 g 1   Evolocumab (REPATHA SURECLICK) 140 MG/ML SOAJ Inject 140 mg into the skin every 14 (fourteen) days. 6 mL 3   fluticasone (FLONASE) 50 MCG/ACT nasal spray Place 2 sprays into both nostrils 2 (two) times daily. PRN     fluticasone-salmeterol (WIXELA INHUB) 250-50 MCG/ACT AEPB Inhale 1 puff into the lungs in the morning and at bedtime. PRN     glucose blood test strip Use as instructed to check blood sugar daily. Dx E11.9 100 each 12   ibuprofen (IBU) 800 MG tablet Take 1 tablet (800 mg total) by mouth 3 (three) times daily with meals as needed. 90 tablet 1   insulin glargine (LANTUS SOLOSTAR) 100 UNIT/ML Solostar Pen Inject 5-20 Units into the skin daily. 15 mL PRN   Insulin Pen Needle 30G X 5 MM MISC Use daily with insulin injection 100 each 3   ipratropium (ATROVENT) 0.06 % nasal spray Place 2 sprays into both nostrils 3 (three) times daily. (Patient taking differently: Place 2 sprays into both nostrils 3 (three) times daily. PRN) 15 mL 6   levocetirizine (XYZAL) 5  MG tablet Take 1 tablet (5 mg total) by mouth every evening. 90 tablet 3   metFORMIN (GLUCOPHAGE) 500 MG tablet Take 0.5-1 tablets (250-500 mg total) by mouth daily with breakfast.     montelukast (SINGULAIR) 10 MG tablet Take 1 tablet (10 mg total) by mouth at bedtime. 90 tablet 1   ondansetron (ZOFRAN) 4 MG tablet Take 1 tablet (4 mg total) by mouth every 8 (eight) hours as needed for nausea or vomiting. (Patient taking differently: Take 4 mg by mouth every 8 (eight) hours as needed for nausea  or vomiting. PRN) 20 tablet 0   pantoprazole (PROTONIX) 40 MG tablet Take 1 tablet (40 mg total) by mouth 2 (two) times daily 30 minutes before meals. 180 tablet 0   topiramate (TOPAMAX) 100 MG tablet TAKE 1 TABLET BY MOUTH TWICE DAILY 180 tablet 1   traZODone (DESYREL) 50 MG tablet Take 1 tablet (50 mg total) by mouth at bedtime. 90 tablet 3   ursodiol (ACTIGALL) 300 MG capsule Take 1 capsule (300 mg total) by mouth 2 (two) times daily. 180 capsule 3   No current facility-administered medications for this visit.   Allergies  Allergen Reactions   Invokana [Canagliflozin]     VAGINAL YEAST INFECTIONS   Metformin And Related     GI upset at higher doses   Ozempic (0.25 Or 0.5 Mg-Dose) [Semaglutide(0.25 Or 0.5mg -Dos)]     GI intolerance.  Has tolerated trulicity 0.75mg  per dose.    Statins     LFT elevation   Versed [Midazolam]     Talkative after med use   Zetia [Ezetimibe] Cough   Bupropion Hcl Rash     Review of Systems: All systems reviewed and negative except where noted in HPI.   Lab Results  Component Value Date   WBC 6.5 12/22/2022   HGB 14.4 12/22/2022   HCT 44.3 12/22/2022   MCV 85.0 12/22/2022   PLT 285 12/22/2022    Lab Results  Component Value Date   ALT 32 12/22/2022   AST 27 12/22/2022   ALKPHOS 129 (H) 12/22/2022   BILITOT 0.6 12/22/2022    Lab Results  Component Value Date   NA 137 06/21/2023   CL 102 06/21/2023   K 4.2 06/21/2023   CO2 25 06/21/2023   BUN 19 06/21/2023   CREATININE 0.72 06/21/2023   GFR 84.39 06/21/2023   CALCIUM 9.0 06/21/2023   ALBUMIN 3.8 12/22/2022   GLUCOSE 162 (H) 06/21/2023     Physical Exam: BP 110/70 (BP Location: Left Arm, Patient Position: Sitting, Cuff Size: Normal)   Pulse 76   Ht 5\' 2"  (1.575 m) Comment: height measured without shoes  Wt 197 lb (89.4 kg)   BMI 36.03 kg/m  Constitutional: Pleasant,well-developed, female in no acute distress. HEENT: Normocephalic and atraumatic. Conjunctivae are normal. No  scleral icterus. Neck supple.  Cardiovascular: Normal rate, regular rhythm.  Pulmonary/chest: Effort normal and breath sounds normal. No wheezing, rales or rhonchi. Abdominal: Soft, nondistended, nontender.  There are no masses palpable. No hepatomegaly. Extremities: no edema Neurological: Alert and oriented to person place and time. Skin: Skin is warm and dry. No rashes noted. Psychiatric: Normal mood and affect. Behavior is normal.  ASSESSMENT: 73 y.o. female here for assessment of the following  1. Barrett's esophagus without dysplasia   2. Gastroesophageal reflux disease, unspecified whether esophagitis present   3. Long-term current use of proton pump inhibitor therapy   4. Primary biliary cholangitis (HCC)   5. Fatty liver  Reviewed her history of GERD and Barrett's with her.  Short segment of Barrett's, hopefully risk for progression/malignancy is quite low.  We discussed Barrett's, risk for malignancy.  Recommend surveillance endoscopy 5 years from her last exam so she is due in January 2026 for her endoscopy.  She is agreeable with that, she has no alarm symptoms.  We discussed her reflux for a bit, currently well-controlled on high-dose Protonix.  She has tried once daily dosing of Protonix which has not worked well and led to frequent breakthrough.  We discussed risks and benefits of chronic PPI use, she understands this.  Feel that benefits of higher dosing outweigh the risks given her symptoms reliably recur at once daily dosing.  She will continue this for now.  She had a DEXA scan in 2021 which was normal, recommend she discuss with her primary care frequency of how often she needs DEXA screening.  Reviewed her liver history with her.  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  She also has fatty liver and has gained weight over the past year and her A1c has risen.  She has had her diabetes regimen adjusted and hopefully A1c will come  down.  She has not tolerated higher dosing of Trulicity but has maintained on it to some extent.  We discussed with both of these conditions she is at risk for fibrotic change of the liver.  She has not had imaging in her liver for 3 years and her liver enzymes have risen slightly in the past year.  I offered her a right upper quadrant ultrasound with elastography to assess for fibrotic change and she wants to proceed with that.  I will recheck her LFTs now.  If her alk phos remains persistently elevated will see if she would be willing to increase her ursodiol dose.  Otherwise counseled her on need for weight loss to treat the fatty liver, she is working on this.   PLAN: - continue protonix 40mg  BID for now - intolerant of lower dosing, counseled on long term risks of chronic PPI use - recall EGD 07/2024 for Barrett's - continue ursodiol - continue to manage DM, work on weight loss - on Trulicity but intolerant of higher dosing - LFTs today - RUQ [place] with elastography  Harlin Rain, MD [facility]  CC: Joaquim Nam, MD

## 2023-07-27 ENCOUNTER — Other Ambulatory Visit: Payer: Self-pay | Admitting: *Deleted

## 2023-07-27 ENCOUNTER — Other Ambulatory Visit: Payer: Self-pay

## 2023-07-27 MED ORDER — URSODIOL 300 MG PO CAPS
600.0000 mg | ORAL_CAPSULE | Freq: Two times a day (BID) | ORAL | 0 refills | Status: DC
  Filled 2023-07-27 – 2023-08-23 (×3): qty 360, 90d supply, fill #0

## 2023-08-02 ENCOUNTER — Ambulatory Visit: Payer: Medicare PPO

## 2023-08-07 ENCOUNTER — Ambulatory Visit (HOSPITAL_COMMUNITY)
Admission: RE | Admit: 2023-08-07 | Discharge: 2023-08-07 | Disposition: A | Discharge status: Home / Self Care | Payer: Medicare PPO | Source: Ambulatory Visit | Attending: Gastroenterology | Admitting: Gastroenterology

## 2023-08-07 ENCOUNTER — Encounter: Payer: Self-pay | Admitting: Gastroenterology

## 2023-08-07 DIAGNOSIS — K2270 Barrett's esophagus without dysplasia: Secondary | ICD-10-CM | POA: Insufficient documentation

## 2023-08-07 DIAGNOSIS — K760 Fatty (change of) liver, not elsewhere classified: Secondary | ICD-10-CM | POA: Insufficient documentation

## 2023-08-07 DIAGNOSIS — Z79899 Other long term (current) drug therapy: Secondary | ICD-10-CM | POA: Insufficient documentation

## 2023-08-07 DIAGNOSIS — K219 Gastro-esophageal reflux disease without esophagitis: Secondary | ICD-10-CM | POA: Insufficient documentation

## 2023-08-07 DIAGNOSIS — K743 Primary biliary cirrhosis: Secondary | ICD-10-CM | POA: Diagnosis not present

## 2023-08-07 DIAGNOSIS — K838 Other specified diseases of biliary tract: Secondary | ICD-10-CM | POA: Diagnosis not present

## 2023-08-08 ENCOUNTER — Other Ambulatory Visit: Payer: Self-pay

## 2023-08-08 ENCOUNTER — Encounter: Payer: Self-pay | Admitting: Gastroenterology

## 2023-08-08 DIAGNOSIS — R748 Abnormal levels of other serum enzymes: Secondary | ICD-10-CM

## 2023-08-08 DIAGNOSIS — K743 Primary biliary cirrhosis: Secondary | ICD-10-CM

## 2023-08-14 ENCOUNTER — Other Ambulatory Visit: Payer: Self-pay | Admitting: Gastroenterology

## 2023-08-14 ENCOUNTER — Ambulatory Visit (HOSPITAL_COMMUNITY)
Admission: RE | Admit: 2023-08-14 | Discharge: 2023-08-14 | Disposition: A | Discharge status: Home / Self Care | Payer: Medicare PPO | Source: Ambulatory Visit | Attending: Gastroenterology | Admitting: Gastroenterology

## 2023-08-14 DIAGNOSIS — R748 Abnormal levels of other serum enzymes: Secondary | ICD-10-CM | POA: Diagnosis not present

## 2023-08-14 DIAGNOSIS — K743 Primary biliary cirrhosis: Secondary | ICD-10-CM

## 2023-08-14 DIAGNOSIS — K760 Fatty (change of) liver, not elsewhere classified: Secondary | ICD-10-CM | POA: Diagnosis not present

## 2023-08-14 DIAGNOSIS — K449 Diaphragmatic hernia without obstruction or gangrene: Secondary | ICD-10-CM | POA: Diagnosis not present

## 2023-08-14 MED ORDER — GADOBUTROL 1 MMOL/ML IV SOLN
9.0000 mL | Freq: Once | INTRAVENOUS | Status: AC | PRN
  Administered 2023-08-14: 9 mL via INTRAVENOUS

## 2023-08-18 ENCOUNTER — Encounter: Payer: Self-pay | Admitting: Gastroenterology

## 2023-08-19 ENCOUNTER — Other Ambulatory Visit: Payer: Self-pay

## 2023-08-19 ENCOUNTER — Encounter: Payer: Self-pay | Admitting: Family Medicine

## 2023-08-20 ENCOUNTER — Other Ambulatory Visit: Payer: Self-pay

## 2023-08-21 ENCOUNTER — Ambulatory Visit: Payer: Medicare PPO

## 2023-08-22 ENCOUNTER — Other Ambulatory Visit: Payer: Self-pay

## 2023-08-22 ENCOUNTER — Other Ambulatory Visit: Payer: Self-pay | Admitting: Family Medicine

## 2023-08-22 MED ORDER — AMOXICILLIN 500 MG PO CAPS
2000.0000 mg | ORAL_CAPSULE | ORAL | 0 refills | Status: DC
  Filled 2023-08-22: qty 16, 4d supply, fill #0

## 2023-08-22 MED ORDER — TOPIRAMATE 100 MG PO TABS
100.0000 mg | ORAL_TABLET | Freq: Two times a day (BID) | ORAL | 1 refills | Status: DC
  Filled 2023-08-22: qty 180, 90d supply, fill #0
  Filled 2023-12-07: qty 180, 90d supply, fill #1

## 2023-08-23 ENCOUNTER — Other Ambulatory Visit: Payer: Self-pay

## 2023-08-29 ENCOUNTER — Other Ambulatory Visit: Payer: Self-pay

## 2023-09-06 ENCOUNTER — Other Ambulatory Visit: Payer: Self-pay | Admitting: Family Medicine

## 2023-09-06 ENCOUNTER — Other Ambulatory Visit: Payer: Self-pay

## 2023-09-06 MED FILL — Pantoprazole Sodium EC Tab 40 MG (Base Equiv): ORAL | 90 days supply | Qty: 180 | Fill #0 | Status: AC

## 2023-09-07 ENCOUNTER — Other Ambulatory Visit: Payer: Self-pay

## 2023-09-11 ENCOUNTER — Other Ambulatory Visit: Payer: Self-pay

## 2023-09-13 ENCOUNTER — Other Ambulatory Visit: Payer: Self-pay

## 2023-09-14 ENCOUNTER — Other Ambulatory Visit: Payer: Self-pay

## 2023-09-20 ENCOUNTER — Other Ambulatory Visit: Payer: Self-pay

## 2023-09-28 ENCOUNTER — Other Ambulatory Visit: Payer: Self-pay

## 2023-09-28 ENCOUNTER — Other Ambulatory Visit: Payer: Self-pay | Admitting: Family Medicine

## 2023-09-30 ENCOUNTER — Other Ambulatory Visit: Payer: Self-pay

## 2023-09-30 MED ORDER — METFORMIN HCL 500 MG PO TABS
250.0000 mg | ORAL_TABLET | Freq: Every day | ORAL | 1 refills | Status: DC
Start: 2023-09-30 — End: 2024-02-08
  Filled 2023-09-30: qty 90, 90d supply, fill #0
  Filled 2023-12-13 – 2023-12-14 (×2): qty 90, 90d supply, fill #1

## 2023-10-03 ENCOUNTER — Other Ambulatory Visit: Payer: Self-pay

## 2023-10-04 ENCOUNTER — Other Ambulatory Visit: Payer: Self-pay

## 2023-10-11 ENCOUNTER — Other Ambulatory Visit: Payer: Self-pay

## 2023-10-30 ENCOUNTER — Other Ambulatory Visit: Payer: Self-pay

## 2023-11-08 ENCOUNTER — Encounter: Payer: Self-pay | Admitting: Family Medicine

## 2023-11-08 ENCOUNTER — Other Ambulatory Visit: Payer: Self-pay

## 2023-11-08 ENCOUNTER — Ambulatory Visit: Admitting: Family Medicine

## 2023-11-08 VITALS — BP 114/60 | HR 80 | Temp 98.7°F | Ht 62.0 in

## 2023-11-08 DIAGNOSIS — Z794 Long term (current) use of insulin: Secondary | ICD-10-CM

## 2023-11-08 DIAGNOSIS — E119 Type 2 diabetes mellitus without complications: Secondary | ICD-10-CM

## 2023-11-08 DIAGNOSIS — Z7985 Long-term (current) use of injectable non-insulin antidiabetic drugs: Secondary | ICD-10-CM | POA: Diagnosis not present

## 2023-11-08 DIAGNOSIS — Z7984 Long term (current) use of oral hypoglycemic drugs: Secondary | ICD-10-CM

## 2023-11-08 DIAGNOSIS — J019 Acute sinusitis, unspecified: Secondary | ICD-10-CM

## 2023-11-08 LAB — POCT GLYCOSYLATED HEMOGLOBIN (HGB A1C): Hemoglobin A1C: 7.3 % — AB (ref 4.0–5.6)

## 2023-11-08 MED ORDER — LANTUS SOLOSTAR 100 UNIT/ML ~~LOC~~ SOPN
26.0000 [IU] | PEN_INJECTOR | Freq: Every day | SUBCUTANEOUS | Status: DC
Start: 2023-11-08 — End: 2023-11-12

## 2023-11-08 MED ORDER — AMOXICILLIN-POT CLAVULANATE 875-125 MG PO TABS
1.0000 | ORAL_TABLET | Freq: Two times a day (BID) | ORAL | 0 refills | Status: DC
  Filled 2023-11-08: qty 20, 10d supply, fill #0

## 2023-11-08 MED ORDER — HYDROCODONE BIT-HOMATROP MBR 5-1.5 MG/5ML PO SOLN
5.0000 mL | Freq: Three times a day (TID) | ORAL | 0 refills | Status: DC | PRN
Start: 2023-11-08 — End: 2024-02-08
  Filled 2023-11-08: qty 75, 5d supply, fill #0

## 2023-11-08 NOTE — Progress Notes (Unsigned)
 Diabetes:  Using medications without difficulties: yes Hypoglycemic episodes:no Hyperglycemic episodes:no Feet problems: no Blood Sugars averaging: <150 eye exam within last year: yes, [name] A1c and MALB done at [facility].   Cough, sick for about 2 weeks.  Initially attributed to allergies.  Then more sinus pressure.  No fevers >100.4 but felt hot.  HA.  Some ear pressure.  Facial pain, frontal, not maxillary.  Using sinus rinse in the meantime.   Meds, vitals, and allergies reviewed.   ROS: Per HPI unless specifically indicated in ROS section   GEN: nad, alert and oriented HEENT: mucous membranes moist, TM wnl. OP wnl. Nasal irritation, frontal sinuses ttp.  NECK: supple w/o LA CV: rrr. PULM: coarse BS but o/w ctab, no inc wob.  ABD: soft, +bs EXT: no edema SKIN: no acute rash  Diabetic foot exam: Normal inspection No skin breakdown No calluses  Normal DP pulses Normal sensation to light touch and monofilament Nails normal

## 2023-11-08 NOTE — Patient Instructions (Addendum)
 Recheck in about 3 months.  A1c at the visit.  Start augmentin .  Rest and fluids, continue your inhalers. Hycodan if needed, sedation caution.   Go to the lab on the way out.   If you have mychart we'll likely use that to update you.    Take care.  Glad to see you.

## 2023-11-09 LAB — MICROALBUMIN / CREATININE URINE RATIO
Creatinine,U: 54.9 mg/dL
Microalb Creat Ratio: UNDETERMINED mg/g (ref 0.0–30.0)
Microalb, Ur: <0.7 mg/dL

## 2023-11-11 ENCOUNTER — Encounter: Payer: Self-pay | Admitting: Family Medicine

## 2023-11-11 DIAGNOSIS — J019 Acute sinusitis, unspecified: Secondary | ICD-10-CM | POA: Insufficient documentation

## 2023-11-11 NOTE — Assessment & Plan Note (Signed)
 Recheck in about 3 months.   No change in medications.  See notes on labs. Continue Trulicity  and Jardiance  along with insulin .

## 2023-11-11 NOTE — Assessment & Plan Note (Signed)
 Supportive care.  Okay for outpatient follow-up. Start augmentin .  Rest and fluids, continue inhalers. Hycodan if needed, sedation caution.  Update us  as needed.  She agrees.

## 2023-11-12 ENCOUNTER — Other Ambulatory Visit: Payer: Self-pay | Admitting: Family Medicine

## 2023-11-12 ENCOUNTER — Other Ambulatory Visit: Payer: Self-pay

## 2023-11-12 MED ORDER — LANTUS SOLOSTAR 100 UNIT/ML ~~LOC~~ SOPN: 26.0000 [IU] | PEN_INJECTOR | Freq: Every day | SUBCUTANEOUS | 3 refills | Status: DC

## 2023-11-13 ENCOUNTER — Other Ambulatory Visit: Payer: Self-pay

## 2023-11-13 MED ORDER — LANTUS SOLOSTAR 100 UNIT/ML ~~LOC~~ SOPN
26.0000 [IU] | PEN_INJECTOR | Freq: Every day | SUBCUTANEOUS | 3 refills | Status: DC
  Filled 2023-11-13: qty 15, 57d supply, fill #0

## 2023-11-13 MED ORDER — LANTUS SOLOSTAR 100 UNIT/ML ~~LOC~~ SOPN: 26.0000 [IU] | PEN_INJECTOR | Freq: Every day | SUBCUTANEOUS | Status: DC

## 2023-11-15 ENCOUNTER — Other Ambulatory Visit: Payer: Self-pay

## 2023-11-28 ENCOUNTER — Other Ambulatory Visit: Payer: Self-pay | Admitting: Gastroenterology

## 2023-11-28 ENCOUNTER — Other Ambulatory Visit: Payer: Self-pay

## 2023-11-28 MED ORDER — URSODIOL 300 MG PO CAPS
600.0000 mg | ORAL_CAPSULE | Freq: Two times a day (BID) | ORAL | 1 refills | Status: DC
  Filled 2023-11-28: qty 360, 90d supply, fill #0
  Filled 2024-02-29: qty 360, 90d supply, fill #1

## 2023-12-07 ENCOUNTER — Other Ambulatory Visit: Payer: Self-pay

## 2023-12-07 ENCOUNTER — Other Ambulatory Visit: Payer: Self-pay | Admitting: Family Medicine

## 2023-12-07 NOTE — Telephone Encounter (Signed)
 LOV: 11/08/23 NOV: NOTHING SCHEDULED LAST REFILL:  ALPRAZolam  (XANAX ) 0.5 MG tablet 05/02/23 60 tablets 1 refill  pantoprazole  (PROTONIX ) 40 MG tablet 09/06/23 180 tablets 0 refills

## 2023-12-10 ENCOUNTER — Other Ambulatory Visit: Payer: Self-pay

## 2023-12-10 MED FILL — Pantoprazole Sodium EC Tab 40 MG (Base Equiv): ORAL | 90 days supply | Qty: 180 | Fill #0 | Status: AC

## 2023-12-10 NOTE — Telephone Encounter (Signed)
 LOV 11/08/23  NOV not scheduled   Xanax  05/02/23 #60 w/ 1 refill

## 2023-12-11 ENCOUNTER — Other Ambulatory Visit: Payer: Self-pay

## 2023-12-11 MED FILL — Alprazolam Tab 0.5 MG: ORAL | 30 days supply | Qty: 60 | Fill #0 | Status: AC

## 2023-12-11 NOTE — Telephone Encounter (Signed)
 Sent. Thanks.

## 2023-12-13 ENCOUNTER — Other Ambulatory Visit: Payer: Self-pay

## 2023-12-13 ENCOUNTER — Other Ambulatory Visit: Payer: Self-pay | Admitting: Family Medicine

## 2023-12-13 MED FILL — Montelukast Sodium Tab 10 MG (Base Equiv): ORAL | 90 days supply | Qty: 90 | Fill #0 | Status: AC

## 2023-12-14 ENCOUNTER — Other Ambulatory Visit: Payer: Self-pay

## 2023-12-18 ENCOUNTER — Other Ambulatory Visit: Payer: Self-pay

## 2023-12-18 DIAGNOSIS — E119 Type 2 diabetes mellitus without complications: Secondary | ICD-10-CM | POA: Diagnosis not present

## 2023-12-18 DIAGNOSIS — Z961 Presence of intraocular lens: Secondary | ICD-10-CM | POA: Diagnosis not present

## 2023-12-18 DIAGNOSIS — H40021 Open angle with borderline findings, high risk, right eye: Secondary | ICD-10-CM | POA: Diagnosis not present

## 2023-12-18 DIAGNOSIS — H35351 Cystoid macular degeneration, right eye: Secondary | ICD-10-CM | POA: Diagnosis not present

## 2023-12-18 DIAGNOSIS — H524 Presbyopia: Secondary | ICD-10-CM | POA: Diagnosis not present

## 2023-12-18 LAB — HM DIABETES EYE EXAM

## 2023-12-18 MED ORDER — TIMOLOL MALEATE 0.5 % OP SOLN
1.0000 [drp] | Freq: Two times a day (BID) | OPHTHALMIC | 4 refills | Status: AC
  Filled 2023-12-18: qty 10, 90d supply, fill #0
  Filled 2024-02-29: qty 10, 90d supply, fill #1

## 2023-12-18 MED ORDER — PREDNISOLONE ACETATE 1 % OP SUSP
1.0000 [drp] | Freq: Four times a day (QID) | OPHTHALMIC | 0 refills | Status: DC
  Filled 2023-12-18: qty 15, 75d supply, fill #0

## 2023-12-19 ENCOUNTER — Other Ambulatory Visit: Payer: Self-pay

## 2024-01-03 ENCOUNTER — Other Ambulatory Visit: Payer: Self-pay

## 2024-01-08 NOTE — Progress Notes (Signed)
 [facility] - Clinic Note  01/17/2024     CHIEF COMPLAINT Patient presents for Retina Follow Up   HISTORY OF PRESENT ILLNESS: Natasha Franco is a 73 y.o. female who presents to the clinic today for:   HPI     Retina Follow Up   Patient presents with  Other.  In right eye.  This started 7 months ago.  I, the attending physician,  performed the HPI with the patient and updated documentation appropriately.        Comments   Patient here for 7 months retina follow up for CME OD. Patient states vision can tell OD is better. Not great but better. No eye pain.  Using drops.      Last edited by [name], MD on 01/17/2024  2:16 PM.    Pt is back on the referral of [name] for increased CME OD, she saw [name] on June 10th and was put back of PF QID OD and timolol  BID OD, pt states at that time she could tell that her right eye vision had decreased, but it has improved now  Referring physician: [name] MD [address] [place], [place] [address]  HISTORICAL INFORMATION:   Selected notes from the MEDICAL RECORD NUMBER Referred by [name] for retinoschisis OD LEE:  Ocular Hx- HH plaque X2 OS (2018), cataracts, progressive retinoschisis OD PMH-    CURRENT MEDICATIONS: Current Outpatient Medications (Ophthalmic Drugs)  Medication Sig   prednisoLONE  acetate (PRED FORTE ) 1 % ophthalmic suspension Place 1 drop into the right eye 4 (four) times daily.   timolol  (TIMOPTIC ) 0.5 % ophthalmic solution Place 1 drop into the right eye 2 (two) times daily.   No current facility-administered medications for this visit. (Ophthalmic Drugs)   Current Outpatient Medications (Other)  Medication Sig   Accu-Chek Softclix Lancets lancets Use to check blood sugar daily. Dx E11.9   albuterol (VENTOLIN HFA) 108 (90 Base) MCG/ACT inhaler INHALE 1 TO 2 PUFFS BY MOUTH EVERY 4 TO 6 HOURS AS NEEDED FOR COUGH OR WHEEZING   ALPRAZolam  (XANAX ) 0.5 MG tablet Take 1  tablet (0.5 mg total) by mouth 2 (two) times daily as needed.   amoxicillin -clavulanate (AUGMENTIN ) 875-125 MG tablet Take 1 tablet by mouth 2 (two) times daily.   azelastine  (ASTELIN ) 0.1 % nasal spray Place 2 sprays into both nostrils 2 (two) times daily. Use in each nostril as directed/PRN   Blood Glucose Monitoring Suppl (ACCU-CHEK GUIDE) w/Device KIT Use to check blood sugar daily. Dx E11.9   colchicine  0.6 MG tablet Take 1 tablet (0.6 mg total) by mouth daily as needed.   Dulaglutide  (TRULICITY ) 0.75 MG/0.5ML SOAJ Inject 0.75 mg into the skin once a week.   empagliflozin  (JARDIANCE ) 25 MG TABS tablet Take 1 tablet (25 mg total) by mouth daily.   escitalopram  (LEXAPRO ) 20 MG tablet Take 1 tablet (20 mg total) by mouth at bedtime.   estradiol  (ESTRACE  VAGINAL) 0.1 MG/GM vaginal cream Place 1 Applicatorful vaginally 3 (three) times a week.   Evolocumab  (REPATHA  SURECLICK) 140 MG/ML SOAJ Inject 140 mg into the skin every 14 (fourteen) days.   fluticasone  (FLONASE ) 50 MCG/ACT nasal spray Place 2 sprays into both nostrils 2 (two) times daily. PRN   fluticasone -salmeterol (WIXELA INHUB) 250-50 MCG/ACT AEPB Inhale 1 puff into the lungs in the morning and at bedtime. PRN   glucose blood test strip Use as instructed to check blood sugar daily. Dx E11.9   HYDROcodone   bit-homatropine (HYCODAN) 5-1.5 MG/5ML syrup Take 5 mLs by mouth every 8 (eight) hours as needed for cough (sedation caution.).   ibuprofen  (IBU) 800 MG tablet Take 1 tablet (800 mg total) by mouth 3 (three) times daily with meals as needed.   insulin  glargine (LANTUS  SOLOSTAR) 100 UNIT/ML Solostar Pen Inject 26 Units into the skin daily.   insulin  glargine (LANTUS  SOLOSTAR) 100 UNIT/ML Solostar Pen Inject 26 Units into the skin daily.   Insulin  Pen Needle 30G X 5 MM MISC Use daily with insulin  injection   ipratropium (ATROVENT ) 0.06 % nasal spray Place 2 sprays into both nostrils 3 (three) times daily. (Patient taking differently: Place 2  sprays into both nostrils 3 (three) times daily. PRN)   levocetirizine (XYZAL ) 5 MG tablet Take 1 tablet (5 mg total) by mouth every evening.   metFORMIN  (GLUCOPHAGE ) 500 MG tablet Take 0.5-1 tablets (250-500 mg total) by mouth daily with breakfast.   montelukast  (SINGULAIR ) 10 MG tablet Take 1 tablet (10 mg total) by mouth at bedtime.   ondansetron  (ZOFRAN ) 4 MG tablet Take 1 tablet (4 mg total) by mouth every 8 (eight) hours as needed for nausea or vomiting. (Patient taking differently: Take 4 mg by mouth every 8 (eight) hours as needed for nausea or vomiting. PRN)   pantoprazole  (PROTONIX ) 40 MG tablet Take 1 tablet (40 mg total) by mouth 2 (two) times daily 30 minutes before meals.   topiramate  (TOPAMAX ) 100 MG tablet Take 1 tablet (100 mg total) by mouth 2 (two) times daily.   traZODone  (DESYREL ) 50 MG tablet Take 1 tablet (50 mg total) by mouth at bedtime.   ursodiol  (ACTIGALL ) 300 MG capsule Take 2 capsules (600 mg total) by mouth 2 (two) times daily.   No current facility-administered medications for this visit. (Other)   REVIEW OF SYSTEMS: ROS   Positive for: Gastrointestinal, Neurological, Musculoskeletal, Endocrine, Eyes Negative for: Constitutional, Skin, Genitourinary, HENT, Cardiovascular, Respiratory, Psychiatric, Allergic/Imm, Heme/Lymph Last edited by Orval Asberry RAMAN, COA on 01/17/2024  9:21 AM.     ALLERGIES Allergies  Allergen Reactions   Invokana [Canagliflozin]     VAGINAL YEAST INFECTIONS   Metformin  And Related     GI upset at higher doses   Ozempic  (0.25 Or 0.5 Mg-Dose) [Semaglutide (0.25 Or 0.5mg -Dos)]     GI intolerance.  Has tolerated trulicity  0.75mg  per dose.    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   BLEPHAROPLASTY Bilateral    upper and lower   BREAST BIOPSY Left 2014   BREAST SURGERY Bilateral 1984   reduction mammoplasty   CARPAL TUNNEL RELEASE Right    CATARACT EXTRACTION     CESAREAN SECTION  x 2   FACIAL COSMETIC SURGERY     KNEE ARTHROSCOPY Left    REDUCTION MAMMAPLASTY Bilateral    ROTATOR CUFF REPAIR Right    TONSILLECTOMY     TOTAL KNEE ARTHROPLASTY Left 03/25/2019   Procedure: Left Knee Arthroplasty;  Surgeon: Sheril Coy, MD;  Location: [facility];  Service: Orthopedics;  Laterality: Left;   VAGINAL HYSTERECTOMY     FAMILY HISTORY Family History  Problem Relation Age of Onset   Arthritis Mother    Early death Father    Diabetes Father    Alcohol abuse Father    COPD Father    Heart attack Father    Mental illness Father    Alcohol abuse Sister    Stroke Maternal Grandmother    Stroke Maternal Grandfather    Diabetes Maternal Grandfather    Diabetes Paternal Grandfather    Anxiety disorder Daughter    Hypotension Daughter        POTS   Colon cancer Neg Hx    Liver cancer Neg Hx    Stomach cancer Neg Hx    Esophageal cancer Neg Hx    Rectal cancer Neg Hx    Breast cancer Neg Hx    SOCIAL HISTORY Social History   Tobacco Use   Smoking status: Former    Current packs/day: 0.00    Average packs/day: 1 pack/day for 25.0 years (25.0 ttl pk-yrs)    Types: Cigarettes    Start date: 09/01/1981    Quit date: 09/01/2006    Years since quitting: 17.3   Smokeless tobacco: Never  Vaping Use   Vaping status: Never Used  Substance Use Topics   Alcohol use: Yes    Comment:  occ   Drug use: No       OPHTHALMIC EXAM:  Base Eye Exam     Visual Acuity (Snellen - Linear)       Right Left   Dist [place] 20/25 -1 20/20   Dist ph [place] NI          Tonometry (Tonopen, 9:19 AM)       Right Left   Pressure 17 13         Pupils       Dark Light Shape React APD   Right 3 2 Round Brisk None   Left 3 2 Round Brisk None         Visual Fields (Counting fingers)       Left Right    Full Full         Extraocular Movement       Right Left    Full, Ortho Full, Ortho         Neuro/Psych     Oriented x3: Yes   Mood/Affect: Normal         Dilation     Both eyes: 1.0% Mydriacyl, 2.5% Phenylephrine  @ 9:19 AM           Slit Lamp and Fundus Exam     Slit Lamp Exam       Right Left   Lids/Lashes Dermatochalasis - upper lid Dermatochalasis - upper lid, mild MGD, Telangiectasia   Conjunctiva/Sclera temporal pinguecula White and quiet   Cornea well healed cataract wound, trace tear film debris, 1+ Punctate epithelial erosions inferiorly tear film debris, well healed cataract wound   Anterior Chamber Deep, 0.5+cell deep and clear, no cell or flare   Iris Round and dilated Round and dilated   Lens PC IOL in good  position PC IOL in good position   Anterior Vitreous Vitreous syneresis, 0.5+fine pigment, Posterior vitreous detachment, pigmented debris settled inferiory, vitreous condensations Vitreous syneresis, no pigment, Posterior vitreous detachment, vitreous condensations         Fundus Exam       Right Left   Disc Pink and Sharp, mild tilt, inferior PPA Pink and Sharp, tilted, inferior PPA   C/D Ratio 0.5 0.5   Macula Flat, Blunted foveal reflex, trace cystic changes, mild RPE mottling greatest centrally, No heme, no frank edema Flat, Blunted foveal reflex, mild RPE mottling, No heme or edema   Vessels attenuated, Tortuous, mild AV crossing changes attenuated, mild tortuosity   Periphery Attached, inferior schisis from 0400-0730 with  pigment clumping and pigment atrophy within schisis cavity, outer retinal hole at 0700 -- good barricade laser changes posterior to schisis, no new RT/RD/lattice or schisis Attached, very shallow schisis IT periphery, mild reticular degeneration           IMAGING AND PROCEDURES  Imaging and Procedures for 01/17/2024  OCT, Retina - OU - Both Eyes       Right Eye Quality was good. Central Foveal Thickness: 336. Progression has worsened. Findings include normal foveal contour, no IRF, no SRF (Interval increase in cystic changes inferior macula, mild patchy ellipsoid signal centrally, IT schisis with outer retinal holes caught on widefield -- not imaged today).   Left Eye Quality was good. Central Foveal Thickness: 280. Progression has been stable. Findings include normal foveal contour, no IRF, no SRF, vitreomacular adhesion (Shallow schisis IT periphery caught on widefield - not imaged today).   Notes *Images captured and stored on drive  Diagnosis / Impression:  OD: Interval increase in cystic changes inferior macula, mild patchy ellipsoid signal centrally, IT schisis with outer retinal holes caught on widefield -- not imaged today OS: NFP; no IRF/SRF; Shallow schisis IT periphery caught on widefield -- not imaged today  Clinical management:  See below  Abbreviations: NFP - Normal foveal profile. CME - cystoid macular edema. PED - pigment epithelial detachment. IRF - intraretinal fluid. SRF - subretinal fluid. EZ - ellipsoid zone. ERM - epiretinal membrane. ORA - outer retinal atrophy. ORT - outer retinal tubulation. SRHM - subretinal hyper-reflective material. IRHM - intraretinal hyper-reflective material            ASSESSMENT/PLAN:    ICD-10-CM   1. 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 Diabetes mellitus type 2 without retinopathy (HCC)  E11.9      5. Long term (current) use of oral hypoglycemic drugs  Z79.84     6. Long-term (current) use of injectable non-insulin  antidiabetic drugs  Z79.85     7. Pseudophakia, both eyes  Z96.1     8. Dry eyes  H04.123      1. CME OD - development of IRF and sliver of SRF centrally -- consistent with CME, noted on 07.31.23 visit - suspect delayed [place], s/p CEIOL with Dr. Leslee in March 2023 - started on PF and Ketorolac  QID OD on 07.31.23 - recurrent CME following taper after 08.22.23 visit - s/p STK OD #1 (11.15.23) - referred back today (07.10.25) by [name] for recurrent CME -- noted on 06.10.25 and pt was started on PF and timolol  - today, OCT shows just trace cystic changes  OD -- improved from Dr. Arnie visit - BCVA OD 20/25 from 20/50 at Dr. Arnie office and back to our baseline vision for her OD - recommend continuing PF QID OD w/ f/u in 4 wks -- consider ketorolac  +/- STK if CME worsens or fails to resolve  2,3. Retinoschisis OU - OD: inferior schisis 0400-0730 with pigment clumping and pigment atrophy within schisis cavity, outer retinal hole at 0700 - OS: very shallow schisis IT arcades  - pt reports intermittent photopsias and floaters OU -- likely unrelated to schisis - s/p laser retinopexy OD (01.30.23) -- good barricade laser changes posterior to schisis - no new RT/RD or schisis OU - monitor  4-6. Diabetes mellitus, type 2 without retinopathy  - last A1c was 6.5 on 02.27.24 - The incidence, risk factors for progression, natural history and treatment options for diabetic retinopathy  were discussed with patient.   - The need for close monitoring of blood glucose, blood pressure, and serum lipids, avoiding cigarette or any type of tobacco, and the need for long term follow up was also discussed with patient. - f/u in 1 year, sooner prn  7. Pseudophakia OU  - s/p CE/IOL OU (Dr. Leslee, March / April 2023)  - IOLs in perfect position, doing well  - mild CME OD  stably resolved as above  - monitor  8. Dry eyes OU (OD > OS) - recommend artificial tears and lubricating ointment as needed   Ophthalmic Meds Ordered this visit:  No orders of the defined types were placed in this encounter.    Return in about 4 weeks (around 02/14/2024) for f/u CME OD, DFE, OCT.  There are no Patient Instructions on file for this visit.   Explained the diagnoses, plan, and follow up with the patient and they expressed understanding.  Patient expressed understanding of the importance of proper follow up care.   This document serves as a record of services personally performed by Redell JUDITHANN Hans, MD, PhD. It was created on their behalf by Alan PARAS. Delores, OA an ophthalmic technician. The creation of this record is the provider's dictation and/or activities during the visit.    Electronically signed by: Alan PARAS. Delores, OA 01/17/24 2:18 PM  Redell JUDITHANN Hans, M.D., Ph.D. Diseases & Surgery of the Retina and Vitreous [facility]  I have reviewed the above documentation for accuracy and completeness, and I agree with the above. Redell JUDITHANN Hans, M.D., Ph.D. 01/17/24 2:31 PM   Abbreviations: M myopia (nearsighted); A astigmatism; H hyperopia (farsighted); P presbyopia; Mrx spectacle prescription;  CTL contact lenses; OD right eye; OS left eye; OU both eyes  XT exotropia; ET esotropia; PEK punctate epithelial keratitis; PEE punctate epithelial erosions; DES dry eye syndrome; MGD meibomian gland dysfunction; ATs artificial tears; PFAT's preservative free artificial tears; NSC nuclear sclerotic cataract; PSC posterior subcapsular cataract; ERM epi-retinal membrane; PVD posterior vitreous detachment; RD retinal detachment; DM diabetes mellitus; DR diabetic retinopathy; NPDR non-proliferative diabetic retinopathy; PDR proliferative diabetic retinopathy; CSME clinically significant macular edema; DME diabetic macular edema; dbh dot blot hemorrhages; CWS cotton  wool spot; POAG primary open angle glaucoma; C/D cup-to-disc ratio; HVF humphrey visual field; GVF goldmann visual field; OCT optical coherence tomography; IOP intraocular pressure; BRVO Branch retinal vein occlusion; CRVO central retinal vein occlusion; CRAO central retinal artery occlusion; BRAO branch retinal artery occlusion; RT retinal tear; SB scleral buckle; PPV pars plana vitrectomy; VH Vitreous hemorrhage; PRP panretinal laser photocoagulation; IVK intravitreal kenalog ; VMT vitreomacular traction;  MH Macular hole;  NVD neovascularization of the disc; NVE neovascularization elsewhere; AREDS age related eye disease study; ARMD age related macular degeneration; POAG primary open angle glaucoma; EBMD epithelial/anterior basement membrane dystrophy; ACIOL anterior chamber intraocular lens; IOL intraocular lens; PCIOL posterior chamber intraocular lens; Phaco/IOL phacoemulsification with intraocular lens placement; PRK photorefractive keratectomy; LASIK laser assisted in situ keratomileusis; HTN hypertension; DM diabetes mellitus; COPD chronic obstructive pulmonary disease

## 2024-01-16 ENCOUNTER — Other Ambulatory Visit: Payer: Self-pay

## 2024-01-17 ENCOUNTER — Other Ambulatory Visit: Payer: Self-pay

## 2024-01-17 ENCOUNTER — Ambulatory Visit (INDEPENDENT_AMBULATORY_CARE_PROVIDER_SITE_OTHER): Admitting: Ophthalmology

## 2024-01-17 ENCOUNTER — Encounter (INDEPENDENT_AMBULATORY_CARE_PROVIDER_SITE_OTHER): Payer: Self-pay | Admitting: Ophthalmology

## 2024-01-17 DIAGNOSIS — H33321 Round hole, right eye: Secondary | ICD-10-CM | POA: Diagnosis not present

## 2024-01-17 DIAGNOSIS — H35351 Cystoid macular degeneration, right eye: Secondary | ICD-10-CM

## 2024-01-17 DIAGNOSIS — H33103 Unspecified retinoschisis, bilateral: Secondary | ICD-10-CM | POA: Diagnosis not present

## 2024-01-17 DIAGNOSIS — Z7985 Long-term (current) use of injectable non-insulin antidiabetic drugs: Secondary | ICD-10-CM

## 2024-01-17 DIAGNOSIS — Z7984 Long term (current) use of oral hypoglycemic drugs: Secondary | ICD-10-CM | POA: Diagnosis not present

## 2024-01-17 DIAGNOSIS — H04123 Dry eye syndrome of bilateral lacrimal glands: Secondary | ICD-10-CM

## 2024-01-17 DIAGNOSIS — E119 Type 2 diabetes mellitus without complications: Secondary | ICD-10-CM | POA: Diagnosis not present

## 2024-01-17 DIAGNOSIS — Z961 Presence of intraocular lens: Secondary | ICD-10-CM | POA: Diagnosis not present

## 2024-01-18 ENCOUNTER — Other Ambulatory Visit: Payer: Self-pay

## 2024-01-18 DIAGNOSIS — D2262 Melanocytic nevi of left upper limb, including shoulder: Secondary | ICD-10-CM | POA: Diagnosis not present

## 2024-01-18 DIAGNOSIS — L814 Other melanin hyperpigmentation: Secondary | ICD-10-CM | POA: Diagnosis not present

## 2024-01-18 DIAGNOSIS — C44519 Basal cell carcinoma of skin of other part of trunk: Secondary | ICD-10-CM | POA: Diagnosis not present

## 2024-01-18 DIAGNOSIS — D692 Other nonthrombocytopenic purpura: Secondary | ICD-10-CM | POA: Diagnosis not present

## 2024-01-18 DIAGNOSIS — L570 Actinic keratosis: Secondary | ICD-10-CM | POA: Diagnosis not present

## 2024-01-18 DIAGNOSIS — L821 Other seborrheic keratosis: Secondary | ICD-10-CM | POA: Diagnosis not present

## 2024-01-18 DIAGNOSIS — D485 Neoplasm of uncertain behavior of skin: Secondary | ICD-10-CM | POA: Diagnosis not present

## 2024-01-18 DIAGNOSIS — D1801 Hemangioma of skin and subcutaneous tissue: Secondary | ICD-10-CM | POA: Diagnosis not present

## 2024-01-28 ENCOUNTER — Other Ambulatory Visit: Payer: Self-pay

## 2024-01-28 ENCOUNTER — Telehealth: Payer: Self-pay

## 2024-01-28 ENCOUNTER — Encounter: Payer: Self-pay | Admitting: Family Medicine

## 2024-01-28 DIAGNOSIS — K760 Fatty (change of) liver, not elsewhere classified: Secondary | ICD-10-CM

## 2024-01-28 DIAGNOSIS — R748 Abnormal levels of other serum enzymes: Secondary | ICD-10-CM

## 2024-01-28 NOTE — Telephone Encounter (Signed)
 Spoke with patient and the refill is at the pharmacy in [place]. She will call them to transfer her prescriptions back here.     Copied from CRM [id number]. Topic: General - Other >> Jan 28, 2024  4:29 PM Natasha Franco wrote: Reason for CRM: patient called stating she need a refill on her Evolocumab  (REPATHA  SURECLICK) 140 MG/ML SOAJ and she was told to reach out to her cardiologist to see if she can get the refill there. The patient stated she call them and they are not answering. The patient stated she was on the phone for thirty minutes and no one picked up. The patient want to see if MD Cleatus can reach out to them for her.    [phone]

## 2024-01-28 NOTE — Telephone Encounter (Signed)
 Order placed for LFTs. MyChart message to patient.

## 2024-01-28 NOTE — Telephone Encounter (Signed)
-----   Message from [facility] Clarita H sent at 08/07/2023  5:45 PM EST ----- Regarding: LFTs in July Patient will be due  for LFTs in July. Put in order. Notify pt

## 2024-01-29 ENCOUNTER — Other Ambulatory Visit: Payer: Self-pay

## 2024-01-29 MED ORDER — REPATHA SURECLICK 140 MG/ML ~~LOC~~ SOAJ
140.0000 mg | SUBCUTANEOUS | 6 refills | Status: DC
  Filled 2024-01-29: qty 2, 28d supply, fill #0

## 2024-01-30 NOTE — Telephone Encounter (Signed)
 Called and spoke to patient.  She indicated she will go to the lab soon

## 2024-02-01 ENCOUNTER — Other Ambulatory Visit

## 2024-02-01 DIAGNOSIS — R748 Abnormal levels of other serum enzymes: Secondary | ICD-10-CM | POA: Diagnosis not present

## 2024-02-01 DIAGNOSIS — K760 Fatty (change of) liver, not elsewhere classified: Secondary | ICD-10-CM | POA: Diagnosis not present

## 2024-02-01 LAB — HEPATIC FUNCTION PANEL
ALT: 27 U/L (ref 0–35)
AST: 23 U/L (ref 0–37)
Albumin: 4.1 g/dL (ref 3.5–5.2)
Alkaline Phosphatase: 97 U/L (ref 39–117)
Bilirubin, Direct: 0.1 mg/dL (ref 0.0–0.3)
Total Bilirubin: 0.5 mg/dL (ref 0.2–1.2)
Total Protein: 7.3 g/dL (ref 6.0–8.3)

## 2024-02-04 ENCOUNTER — Ambulatory Visit: Payer: Self-pay | Admitting: Gastroenterology

## 2024-02-04 NOTE — Progress Notes (Signed)
 [facility] - Clinic Note  02/14/2024     CHIEF COMPLAINT Patient presents for Retina Follow Up   HISTORY OF PRESENT ILLNESS: Natasha Franco is a 73 y.o. female who presents to the clinic today for:   HPI     Retina Follow Up   Diagnosis: CME.  In right eye.  This started 1 year ago.  Duration of 4 weeks.  I, the attending physician,  performed the HPI with the patient and updated documentation appropriately.        Comments   Pt states vision seems to be improving. Pt denies FOL/floaters/pain. Pt is mostly consistent with PF qid OD and yellow top BID OD. Pt does not use ats.  A1c=7.2, 02/08/24      Last edited by Natasha Rogue, MD on 02/14/2024  9:37 AM.     Pt states  Referring physician: [name] MD [address] [place], [place] [address]  HISTORICAL INFORMATION:   Selected notes from the MEDICAL RECORD NUMBER Referred by [name] for retinoschisis OD LEE:  Ocular Hx- HH plaque X2 OS (2018), cataracts, progressive retinoschisis OD PMH-    CURRENT MEDICATIONS: Current Outpatient Medications (Ophthalmic Drugs)  Medication Sig   prednisoLONE  acetate (PRED FORTE ) 1 % ophthalmic suspension Place 1 drop into the right eye 4 (four) times daily.   timolol  (TIMOPTIC ) 0.5 % ophthalmic solution Place 1 drop into the right eye 2 (two) times daily.   No current facility-administered medications for this visit. (Ophthalmic Drugs)   Current Outpatient Medications (Other)  Medication Sig   Accu-Chek Softclix Lancets lancets Use to check blood sugar daily. Dx E11.9   albuterol (VENTOLIN HFA) 108 (90 Base) MCG/ACT inhaler INHALE 1 TO 2 PUFFS BY MOUTH EVERY 4 TO 6 HOURS AS NEEDED FOR COUGH OR WHEEZING   ALPRAZolam  (XANAX ) 0.5 MG tablet Take 1 tablet (0.5 mg total) by mouth 2 (two) times daily as needed.   azelastine  (ASTELIN ) 0.1 % nasal spray Place 2 sprays into both nostrils 2 (two) times daily. Use in each nostril as directed/PRN   Blood  Glucose Monitoring Suppl (ACCU-CHEK GUIDE) w/Device KIT Use to check blood sugar daily. Dx E11.9   colchicine  0.6 MG tablet Take 1 tablet (0.6 mg total) by mouth daily as needed.   Continuous Glucose Receiver (DEXCOM G7 RECEIVER) DEVI 1 each by Does not apply route daily.   Continuous Glucose Sensor (DEXCOM G7 SENSOR) MISC 1 each by Does not apply route at bedtime.   Dulaglutide  (TRULICITY ) 0.75 MG/0.5ML SOAJ Inject 0.75 mg into the skin once a week.   empagliflozin  (JARDIANCE ) 25 MG TABS tablet Take 1 tablet (25 mg total) by mouth daily.   escitalopram  (LEXAPRO ) 20 MG tablet Take 1 tablet (20 mg total) by mouth at bedtime.   estradiol  (ESTRACE  VAGINAL) 0.1 MG/GM vaginal cream Place 1 Applicatorful vaginally 3 (three) times a week.   Evolocumab  (REPATHA  SURECLICK) 140 MG/ML SOAJ Inject 140 mg into the skin every 14 (fourteen) days.   fluticasone  (FLONASE ) 50 MCG/ACT nasal spray Place 2 sprays into both nostrils 2 (two) times daily. PRN   fluticasone -salmeterol (WIXELA INHUB) 250-50 MCG/ACT AEPB Inhale 1 puff into the lungs in the morning and at bedtime. PRN   glucose blood test strip Use as instructed to check blood sugar daily. Dx E11.9   ibuprofen  (IBU) 800 MG tablet Take 1 tablet (800 mg total) by mouth 3 (three) times daily with meals as needed.   insulin  glargine (LANTUS  SOLOSTAR)  100 UNIT/ML Solostar Pen Inject 26 Units into the skin daily.   Insulin  Pen Needle 30G X 5 MM MISC Use daily with insulin  injection   ipratropium (ATROVENT ) 0.06 % nasal spray Place 2 sprays into both nostrils 3 (three) times daily as needed for rhinitis.   levocetirizine (XYZAL ) 5 MG tablet Take 1 tablet (5 mg total) by mouth every evening.   metFORMIN  (GLUCOPHAGE ) 500 MG tablet Take 1 tablet (500 mg total) by mouth at bedtime.   montelukast  (SINGULAIR ) 10 MG tablet Take 1 tablet (10 mg total) by mouth at bedtime.   ondansetron  (ZOFRAN ) 4 MG tablet Take 1 tablet (4 mg total) by mouth every 8 (eight) hours as needed  for nausea or vomiting.   pantoprazole  (PROTONIX ) 40 MG tablet Take 1 tablet (40 mg total) by mouth 2 (two) times daily 30 minutes before meals.   topiramate  (TOPAMAX ) 100 MG tablet Take 1 tablet (100 mg total) by mouth at bedtime.   traZODone  (DESYREL ) 50 MG tablet Take 1 tablet (50 mg total) by mouth at bedtime.   ursodiol  (ACTIGALL ) 300 MG capsule Take 2 capsules (600 mg total) by mouth 2 (two) times daily.   No current facility-administered medications for this visit. (Other)   REVIEW OF SYSTEMS: ROS   Positive for: Gastrointestinal, Neurological, Musculoskeletal, Endocrine, Eyes Negative for: Constitutional, Skin, Genitourinary, HENT, Cardiovascular, Respiratory, Psychiatric, Allergic/Imm, Heme/Lymph Last edited by Natasha Franco, COT on 02/14/2024  9:23 AM.     ALLERGIES Allergies  Allergen Reactions   Invokana [Canagliflozin]     VAGINAL YEAST INFECTIONS   Metformin  And Related     GI upset at higher doses   Ozempic  (0.25 Or 0.5 Mg-Dose) [Semaglutide (0.25 Or 0.5mg -Dos)]     GI intolerance.  Has tolerated trulicity  0.75mg  per dose.    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   BLEPHAROPLASTY Bilateral    upper and lower   BREAST BIOPSY Left 2014   BREAST SURGERY Bilateral 1984   reduction mammoplasty   CARPAL TUNNEL RELEASE Right    CATARACT  EXTRACTION     CESAREAN SECTION     x 2   FACIAL COSMETIC SURGERY     KNEE ARTHROSCOPY Left    REDUCTION MAMMAPLASTY Bilateral    ROTATOR CUFF REPAIR Right    TONSILLECTOMY     TOTAL KNEE ARTHROPLASTY Left 03/25/2019   Procedure: Left Knee Arthroplasty;  Surgeon: Sheril Coy, MD;  Location: [facility];  Service: Orthopedics;  Laterality: Left;   VAGINAL HYSTERECTOMY     FAMILY HISTORY Family History  Problem Relation Age of Onset   Arthritis Mother    Early death Father    Diabetes Father    Alcohol  abuse Father    COPD Father    Heart attack Father    Mental illness Father    Alcohol abuse Sister    Stroke Maternal Grandmother    Stroke Maternal Grandfather    Diabetes Maternal Grandfather    Diabetes Paternal Grandfather    Anxiety disorder Daughter    Hypotension Daughter        POTS   Colon cancer Neg Hx    Liver cancer Neg Hx    Stomach cancer Neg Hx    Esophageal cancer Neg Hx    Rectal cancer Neg Hx    Breast cancer Neg Hx    SOCIAL HISTORY Social History   Tobacco Use   Smoking status: Former    Current packs/day: 0.00    Average packs/day: 1 pack/day for 25.0 years (25.0 ttl pk-yrs)    Types: Cigarettes    Start date: 09/01/1981    Quit date: 09/01/2006    Years since quitting: 17.4   Smokeless tobacco: Never  Vaping Use   Vaping status: Never Used  Substance Use Topics   Alcohol use: Yes    Comment: occ   Drug use: No       OPHTHALMIC EXAM:  Base Eye Exam     Visual Acuity (Snellen - Linear)       Right Left   Dist [place] 20/40 -2 20/20 -1   Dist ph [place] 20/30 -2     Correction: Glasses         Tonometry (Tonopen, 9:17 AM)       Right Left   Pressure 20 14         Pupils       Pupils Dark Light Shape React APD   Right PERRL 4 3 Round Minimal None   Left PERRL 3 2 Round Brisk None         Visual Fields       Left Right    Full Full         Extraocular Movement       Right Left    Full, Ortho Full, Ortho          Neuro/Psych     Oriented x3: Yes   Mood/Affect: Normal         Dilation     Both eyes: 1.0% Mydriacyl, 2.5% Phenylephrine  @ 9:17 AM           Slit Lamp and Fundus Exam     Slit Lamp Exam       Right Left   Lids/Lashes Dermatochalasis - upper lid Dermatochalasis - upper lid, mild MGD, Telangiectasia   Conjunctiva/Sclera temporal pinguecula White and quiet   Cornea well healed cataract wound, trace tear film debris, 1+ Punctate epithelial erosions inferiorly tear film debris, well healed cataract wound, 1+ fine  Punctate epithelial erosions   Anterior Chamber Deep, 0.5+cell deep and clear, 0.5+ fine cell and pigment   Iris Round and dilated Round and dilated   Lens PC IOL in good position PC IOL in good position   Anterior Vitreous Vitreous syneresis, Posterior vitreous detachment, pigmented debris settled inferiory, vitreous condensations Vitreous syneresis, no pigment, Posterior vitreous detachment, vitreous condensations         Fundus Exam       Right Left   Disc Pink and Sharp, mild tilt, inferior PPA Pink and Sharp, tilted, inferior PPA   C/D Ratio 0.5 0.5   Macula Flat, Blunted foveal reflex, trace cystic changes--slightly increased, mild RPE  mottling greatest centrally, No heme Flat, Blunted foveal reflex, mild RPE mottling, No heme or edema   Vessels attenuated, Tortuous, mild AV crossing changes attenuated, mild tortuosity   Periphery Attached, inferior schisis from 0400-0730 with pigment clumping and pigment atrophy within schisis cavity, outer retinal hole at 0700 -- good barricade laser changes posterior to schisis, no new RT/RD/lattice or schisis Attached, very shallow schisis IT periphery, mild reticular degeneration           IMAGING AND PROCEDURES  Imaging and Procedures for 02/14/2024  OCT, Retina - OU - Both Eyes       Right Eye Quality was good. Central Foveal Thickness: 335. Progression has worsened. Findings include normal foveal contour, no  IRF, no SRF (Mild interval increase in cystic changes inferior macula, mild patchy ellipsoid signal centrally, IT schisis with outer retinal holes caught on widefield).   Left Eye Quality was good. Central Foveal Thickness: 280. Progression has been stable. Findings include normal foveal contour, no IRF, no SRF, vitreomacular adhesion (Shallow schisis IT periphery caught on widefield - not imaged today).   Notes *Images captured and stored on drive  Diagnosis / Impression:  OD: Mild interval increase in cystic changes inferior macula, mild patchy ellipsoid signal centrally, IT schisis with outer retinal holes caught on widefield OS: NFP; no IRF/SRF; Shallow schisis IT periphery caught on widefield -- not imaged today  Clinical management:  See below  Abbreviations: NFP - Normal foveal profile. CME - cystoid macular edema. PED - pigment epithelial detachment. IRF - intraretinal fluid. SRF - subretinal fluid. EZ - ellipsoid zone. ERM - epiretinal membrane. ORA - outer retinal atrophy. ORT - outer retinal tubulation. SRHM - subretinal hyper-reflective material. IRHM - intraretinal hyper-reflective material      Injection into Tenon's Capsule - OD - Right Eye       Time Out 02/14/2024. 9:58 AM. Confirmed correct patient, procedure, site, and patient consented.   Anesthesia Topical anesthesia was used. Anesthetic medications included Lidocaine 2%, Proparacaine 0.5%.   Procedure Preparation included 5% betadine  to ocular surface, eyelid speculum. A (25g) needle was used.   Injection: 40 mg triamcinolone  acetonide 40 MG/ML   Route: Other, Site: Right Eye   NDC: [phone], Lot: [id number], Expiration date: 10/07/2024, Waste: 0 mL   Post-op Post injection exam found visual acuity of at least counting fingers. The patient tolerated the procedure well. There were no complications. The patient received written and verbal post procedure care education. Post injection medications were not  given.   Notes 1.0 cc of Kenalog -40 (40 mg) injected into subtenon's capsule in the superotemporal quadrant. Betadine  was applied to injection area pre and post-injection then rinsed with sterile BSS. 1 drop of polymixin was instilled into the eye. There were no complications. Pt tolerated procedure well.           ASSESSMENT/PLAN:    ICD-10-CM   1. 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 Diabetes mellitus type 2 without retinopathy (HCC)  E11.9     5. Long term (current) use of oral hypoglycemic drugs  Z79.84     6. Long-term (current) use of injectable non-insulin  antidiabetic drugs  Z79.85  7. Pseudophakia, both eyes  Z96.1     8. Dry eyes  H04.123      1. CME OD - development of IRF and sliver of SRF centrally -- consistent with CME, noted on 07.31.23 visit - suspect delayed [place], s/p CEIOL with Dr. Leslee in March 2023 - started on PF and Ketorolac  QID OD on 07.31.23 - recurrent CME following taper after 08.22.23 visit - s/p STK OD #1 (11.15.23) - referred back 07.10.25 by [name] for recurrent CME -- noted on 06.10.25 and pt was started on PF and timolol  - today, OCT shows Mild interval increase in cystic changes inferior macula - BCVA OD 20/30 down from 20/25   - recommend continuing PF QID OD and STK OD today, 08.07.25 - pt in agreement - RBA of procedure discussed, questions answered - informed consent obtained and signed - see procedure note  - f/u in 4 wks - DFE/OCT  2,3. Retinoschisis OU - OD: inferior schisis 0400-0730 with pigment clumping and pigment atrophy within schisis cavity, outer retinal hole at 0700 - OS: very shallow schisis IT arcades  - pt reports intermittent photopsias and floaters OU -- likely  unrelated to schisis - s/p laser retinopexy OD (01.30.23) -- good barricade laser changes posterior to schisis - no new RT/RD or schisis OU - monitor  4-6. Diabetes mellitus, type 2 without retinopathy  - last A1c was 6.5 on 02.27.24 - The incidence, risk factors for progression, natural history and treatment options for diabetic retinopathy  were discussed with patient.   - The need for close monitoring of blood glucose, blood pressure, and serum lipids, avoiding cigarette or any type of tobacco, and the need for long term follow up was also discussed with patient. - f/u in 1 year, sooner prn  7. Pseudophakia OU  - s/p CE/IOL OU (Dr. Leslee, March / April 2023)  - IOLs in perfect position, doing well  - mild CME OD stably resolved as above  - monitor  8. Dry eyes OU (OD > OS) - recommend artificial tears and lubricating ointment as needed   Ophthalmic Meds Ordered this visit:  Meds ordered this encounter  Medications   prednisoLONE  acetate (PRED FORTE ) 1 % ophthalmic suspension    Sig: Place 1 drop into the right eye 4 (four) times daily.    Dispense:  15 mL    Refill:  2   triamcinolone  acetonide (KENALOG -40) injection 40 mg     Return in about 4 weeks (around 03/13/2024) for CME OD, Dilated Exam, OCT.  There are no Patient Instructions on file for this visit.   Explained the diagnoses, plan, and follow up with the patient and they expressed understanding.  Patient expressed understanding of the importance of proper follow up care.   This document serves as a record of services personally performed by Redell JUDITHANN Hans, MD, PhD. It was created on their behalf by Avelina Pereyra, COA an ophthalmic technician. The creation of this record is the provider's dictation and/or activities during the visit.   Electronically signed by: Avelina GORMAN Pereyra, COT  02/14/24  10:09 AM   This document serves as a record of services personally performed by Redell JUDITHANN Hans, MD, PhD. It was created on  their behalf by Almetta Pesa, an ophthalmic technician. The creation of this record is the provider's dictation and/or activities during the visit.    Electronically signed by: Almetta Pesa, OA, 02/14/24  10:09 AM  Redell JUDITHANN Hans, M.D., Ph.D. Diseases & Surgery  of the Retina and Vitreous [facility]  I have reviewed the above documentation for accuracy and completeness, and I agree with the above. Redell JUDITHANN Hans, M.D., Ph.D. 02/14/24 10:11 AM    Abbreviations: M myopia (nearsighted); A astigmatism; H hyperopia (farsighted); P presbyopia; Mrx spectacle prescription;  CTL contact lenses; OD right eye; OS left eye; OU both eyes  XT exotropia; ET esotropia; PEK punctate epithelial keratitis; PEE punctate epithelial erosions; DES dry eye syndrome; MGD meibomian gland dysfunction; ATs artificial tears; PFAT's preservative free artificial tears; NSC nuclear sclerotic cataract; PSC posterior subcapsular cataract; ERM epi-retinal membrane; PVD posterior vitreous detachment; RD retinal detachment; DM diabetes mellitus; DR diabetic retinopathy; NPDR non-proliferative diabetic retinopathy; PDR proliferative diabetic retinopathy; CSME clinically significant macular edema; DME diabetic macular edema; dbh dot blot hemorrhages; CWS cotton wool spot; POAG primary open angle glaucoma; C/D cup-to-disc ratio; HVF humphrey visual field; GVF goldmann visual field; OCT optical coherence tomography; IOP intraocular pressure; BRVO Branch retinal vein occlusion; CRVO central retinal vein occlusion; CRAO central retinal artery occlusion; BRAO branch retinal artery occlusion; RT retinal tear; SB scleral buckle; PPV pars plana vitrectomy; VH Vitreous hemorrhage; PRP panretinal laser photocoagulation; IVK intravitreal kenalog ; VMT vitreomacular traction; MH Macular hole;  NVD neovascularization of the disc; NVE neovascularization elsewhere; AREDS age related eye disease study; ARMD age related macular  degeneration; POAG primary open angle glaucoma; EBMD epithelial/anterior basement membrane dystrophy; ACIOL anterior chamber intraocular lens; IOL intraocular lens; PCIOL posterior chamber intraocular lens; Phaco/IOL phacoemulsification with intraocular lens placement; PRK photorefractive keratectomy; LASIK laser assisted in situ keratomileusis; HTN hypertension; DM diabetes mellitus; COPD chronic obstructive pulmonary disease

## 2024-02-07 ENCOUNTER — Other Ambulatory Visit: Payer: Self-pay

## 2024-02-07 DIAGNOSIS — E119 Type 2 diabetes mellitus without complications: Secondary | ICD-10-CM

## 2024-02-08 ENCOUNTER — Other Ambulatory Visit

## 2024-02-08 ENCOUNTER — Encounter: Payer: Self-pay | Admitting: Family Medicine

## 2024-02-08 ENCOUNTER — Ambulatory Visit: Admitting: Family Medicine

## 2024-02-08 ENCOUNTER — Telehealth: Payer: Self-pay

## 2024-02-08 VITALS — BP 126/78 | HR 61 | Temp 98.2°F | Ht 62.0 in | Wt 194.4 lb

## 2024-02-08 DIAGNOSIS — E119 Type 2 diabetes mellitus without complications: Secondary | ICD-10-CM | POA: Diagnosis not present

## 2024-02-08 DIAGNOSIS — Z7984 Long term (current) use of oral hypoglycemic drugs: Secondary | ICD-10-CM | POA: Diagnosis not present

## 2024-02-08 LAB — POCT GLYCOSYLATED HEMOGLOBIN (HGB A1C): Hemoglobin A1C: 7.2 % — AB (ref 4.0–5.6)

## 2024-02-08 MED ORDER — AMOXICILLIN 500 MG PO CAPS: 2000.0000 mg | ORAL_CAPSULE | Freq: Once | ORAL | Status: AC

## 2024-02-08 MED ORDER — IPRATROPIUM BROMIDE 0.06 % NA SOLN: 2.0000 | Freq: Three times a day (TID) | NASAL | Status: AC | PRN

## 2024-02-08 MED ORDER — TOPIRAMATE 100 MG PO TABS: 100.0000 mg | ORAL_TABLET | Freq: Every day | ORAL | Status: DC

## 2024-02-08 MED ORDER — METFORMIN HCL 500 MG PO TABS: 500.0000 mg | ORAL_TABLET | Freq: Every day | ORAL | Status: DC

## 2024-02-08 NOTE — Telephone Encounter (Signed)
 I do not see where patient has a continuous glucose monitor? Was she supposed to be getting one prescribed?

## 2024-02-08 NOTE — Telephone Encounter (Signed)
 Copied from CRM [id number]. Topic: Clinical - Medication Prior Auth >> Feb 08, 2024 12:19 PM Chiquita SQUIBB wrote: Reason for CRM: Sherri from [facility] is calling in regarding needing a prior auth for the patients continuous glucose monitor that she would like. Sherri provided a phone number of [phone] to get the prior auth started , she stated they cover the dexcom and free style libra, but they do need a prior auth for the medication. Please let the patient know when it has been approved.

## 2024-02-08 NOTE — Patient Instructions (Addendum)
 Recheck at a yearly visit in about 4 months, labs ahead of time.  Take care.  Glad to see you. Check on coverage for continuous glucose monitor.

## 2024-02-08 NOTE — Progress Notes (Signed)
 Diabetes:  Using medications without difficulties:yes Hypoglycemic episodes:no Hyperglycemic episodes:no Feet problems:no Blood Sugars averaging: usually <150.   eye exam within last year: yes A1c similar to prev, d/w pt at OV.   She is on the max/max tolerated dose of metformin  trulicity  and jardiance .    LFTs normal on last check.    She has derm f/u pending re: MOHS.    PMH and SH reviewed  Meds, vitals, and allergies reviewed.   ROS: Per HPI unless specifically indicated in ROS section   GEN: nad, alert and oriented HEENT: ncat NECK: supple w/o LA CV: rrr. PULM: ctab, no inc wob ABD: soft, +bs EXT: no edema SKIN: Well-perfused.

## 2024-02-10 NOTE — Telephone Encounter (Signed)
 She was going to check on coverage and update us . Please send rx for decxom CGM/start the PA if needed.   Thanks.

## 2024-02-10 NOTE — Assessment & Plan Note (Signed)
 A1c similar to prev, d/w pt at OV.   She is on the max/max tolerated dose of metformin  trulicity  and jardiance .    LFTs normal on last check.    Recheck at a yearly visit in about 4 months, labs ahead of time.   She is going to check on continuous glucose monitor coverage as that may help her regulate her sugar.  No other change in medications at this point.  Continue work on diet and exercise.

## 2024-02-11 ENCOUNTER — Other Ambulatory Visit: Payer: Self-pay

## 2024-02-11 DIAGNOSIS — E119 Type 2 diabetes mellitus without complications: Secondary | ICD-10-CM

## 2024-02-11 MED ORDER — DEXCOM G7 SENSOR MISC
1.0000 | Freq: Every day | 4 refills | Status: AC
  Filled 2024-02-11: qty 6, 60d supply, fill #0
  Filled 2024-03-28: qty 6, 60d supply, fill #1
  Filled 2024-04-02: qty 1, 10d supply, fill #1
  Filled 2024-04-11: qty 3, 30d supply, fill #2
  Filled 2024-05-09: qty 3, 30d supply, fill #3
  Filled 2024-06-09: qty 3, 30d supply, fill #4
  Filled 2024-07-07: qty 3, 30d supply, fill #5
  Filled 2024-08-04 – 2024-08-11 (×3): qty 3, 30d supply, fill #6

## 2024-02-11 MED ORDER — DEXCOM G7 RECEIVER DEVI
1.0000 | Freq: Every day | 0 refills | Status: AC
Start: 2024-02-11 — End: ?
  Filled 2024-02-11: qty 1, 1d supply, fill #0

## 2024-02-13 ENCOUNTER — Other Ambulatory Visit: Payer: Self-pay | Admitting: Family Medicine

## 2024-02-14 ENCOUNTER — Encounter (INDEPENDENT_AMBULATORY_CARE_PROVIDER_SITE_OTHER): Payer: Self-pay | Admitting: Ophthalmology

## 2024-02-14 ENCOUNTER — Other Ambulatory Visit: Payer: Self-pay

## 2024-02-14 ENCOUNTER — Other Ambulatory Visit: Payer: Self-pay | Admitting: Internal Medicine

## 2024-02-14 ENCOUNTER — Other Ambulatory Visit: Payer: Self-pay | Admitting: Family Medicine

## 2024-02-14 ENCOUNTER — Ambulatory Visit (INDEPENDENT_AMBULATORY_CARE_PROVIDER_SITE_OTHER): Admitting: Ophthalmology

## 2024-02-14 DIAGNOSIS — Z7984 Long term (current) use of oral hypoglycemic drugs: Secondary | ICD-10-CM | POA: Diagnosis not present

## 2024-02-14 DIAGNOSIS — H04123 Dry eye syndrome of bilateral lacrimal glands: Secondary | ICD-10-CM | POA: Diagnosis not present

## 2024-02-14 DIAGNOSIS — Z961 Presence of intraocular lens: Secondary | ICD-10-CM | POA: Diagnosis not present

## 2024-02-14 DIAGNOSIS — E119 Type 2 diabetes mellitus without complications: Secondary | ICD-10-CM | POA: Diagnosis not present

## 2024-02-14 DIAGNOSIS — H33103 Unspecified retinoschisis, bilateral: Secondary | ICD-10-CM | POA: Diagnosis not present

## 2024-02-14 DIAGNOSIS — H33321 Round hole, right eye: Secondary | ICD-10-CM

## 2024-02-14 DIAGNOSIS — H35351 Cystoid macular degeneration, right eye: Secondary | ICD-10-CM | POA: Diagnosis not present

## 2024-02-14 DIAGNOSIS — Z7985 Long-term (current) use of injectable non-insulin antidiabetic drugs: Secondary | ICD-10-CM | POA: Diagnosis not present

## 2024-02-14 DIAGNOSIS — E782 Mixed hyperlipidemia: Secondary | ICD-10-CM

## 2024-02-14 MED ORDER — TRIAMCINOLONE ACETONIDE 40 MG/ML IJ SUSP FOR KALEIDOSCOPE
40.0000 mg | INTRAMUSCULAR | Status: AC | PRN
  Administered 2024-02-14: 40 mg

## 2024-02-14 MED ORDER — PREDNISOLONE ACETATE 1 % OP SUSP
1.0000 [drp] | Freq: Four times a day (QID) | OPHTHALMIC | 2 refills | Status: DC
  Filled 2024-02-14: qty 10, 50d supply, fill #0

## 2024-02-15 ENCOUNTER — Other Ambulatory Visit: Payer: Self-pay | Admitting: Family Medicine

## 2024-02-15 ENCOUNTER — Other Ambulatory Visit: Payer: Self-pay

## 2024-02-15 DIAGNOSIS — E782 Mixed hyperlipidemia: Secondary | ICD-10-CM

## 2024-02-18 ENCOUNTER — Other Ambulatory Visit: Payer: Self-pay

## 2024-02-18 MED FILL — Evolocumab Subcutaneous Soln Auto-Injector 140 MG/ML: SUBCUTANEOUS | 28 days supply | Qty: 2 | Fill #0 | Status: CN

## 2024-02-18 MED FILL — Ibuprofen Tab 800 MG: ORAL | 30 days supply | Qty: 90 | Fill #0 | Status: AC

## 2024-02-19 ENCOUNTER — Other Ambulatory Visit: Payer: Self-pay

## 2024-02-19 MED FILL — Evolocumab Subcutaneous Soln Auto-Injector 140 MG/ML: SUBCUTANEOUS | 28 days supply | Qty: 2 | Fill #0 | Status: AC

## 2024-02-21 ENCOUNTER — Other Ambulatory Visit: Payer: Self-pay

## 2024-02-21 DIAGNOSIS — Z85828 Personal history of other malignant neoplasm of skin: Secondary | ICD-10-CM | POA: Diagnosis not present

## 2024-02-21 DIAGNOSIS — C44519 Basal cell carcinoma of skin of other part of trunk: Secondary | ICD-10-CM | POA: Diagnosis not present

## 2024-02-21 MED ORDER — DOXYCYCLINE HYCLATE 100 MG PO CAPS
100.0000 mg | ORAL_CAPSULE | Freq: Two times a day (BID) | ORAL | 0 refills | Status: AC
  Filled 2024-02-21: qty 10, 5d supply, fill #0

## 2024-02-28 DIAGNOSIS — L988 Other specified disorders of the skin and subcutaneous tissue: Secondary | ICD-10-CM | POA: Diagnosis not present

## 2024-02-28 DIAGNOSIS — Z85828 Personal history of other malignant neoplasm of skin: Secondary | ICD-10-CM | POA: Diagnosis not present

## 2024-02-28 DIAGNOSIS — D485 Neoplasm of uncertain behavior of skin: Secondary | ICD-10-CM | POA: Diagnosis not present

## 2024-03-07 ENCOUNTER — Other Ambulatory Visit: Payer: Self-pay

## 2024-03-07 ENCOUNTER — Other Ambulatory Visit: Payer: Self-pay | Admitting: Family Medicine

## 2024-03-07 MED ORDER — TRULICITY 0.75 MG/0.5ML ~~LOC~~ SOAJ
0.7500 mg | SUBCUTANEOUS | 3 refills | Status: DC
  Filled 2024-03-07: qty 6, 84d supply, fill #0

## 2024-03-07 MED ORDER — PANTOPRAZOLE SODIUM 40 MG PO TBEC
40.0000 mg | DELAYED_RELEASE_TABLET | Freq: Two times a day (BID) | ORAL | 0 refills | Status: DC
  Filled 2024-03-07: qty 180, 90d supply, fill #0

## 2024-03-08 MED FILL — Montelukast Sodium Tab 10 MG (Base Equiv): ORAL | 90 days supply | Qty: 90 | Fill #1 | Status: AC

## 2024-03-11 ENCOUNTER — Other Ambulatory Visit: Payer: Self-pay

## 2024-03-11 ENCOUNTER — Ambulatory Visit: Admitting: Family Medicine

## 2024-03-11 ENCOUNTER — Encounter: Payer: Self-pay | Admitting: Family Medicine

## 2024-03-11 VITALS — BP 126/84 | HR 64 | Temp 97.0°F | Ht 62.0 in | Wt 193.0 lb

## 2024-03-11 DIAGNOSIS — M549 Dorsalgia, unspecified: Secondary | ICD-10-CM | POA: Diagnosis not present

## 2024-03-11 LAB — COMPREHENSIVE METABOLIC PANEL WITH GFR
ALT: 28 U/L (ref 0–35)
AST: 24 U/L (ref 0–37)
Albumin: 4 g/dL (ref 3.5–5.2)
Alkaline Phosphatase: 90 U/L (ref 39–117)
BUN: 19 mg/dL (ref 6–23)
CO2: 24 meq/L (ref 19–32)
Calcium: 8.9 mg/dL (ref 8.4–10.5)
Chloride: 105 meq/L (ref 96–112)
Creatinine, Ser: 0.75 mg/dL (ref 0.40–1.20)
GFR: 79.94 mL/min (ref >60.00)
Glucose, Bld: 133 mg/dL — ABNORMAL HIGH (ref 70–99)
Potassium: 4.6 meq/L (ref 3.5–5.1)
Sodium: 139 meq/L (ref 135–145)
Total Bilirubin: 0.5 mg/dL (ref 0.2–1.2)
Total Protein: 6.9 g/dL (ref 6.0–8.3)

## 2024-03-11 LAB — CBC WITH DIFFERENTIAL/PLATELET
Basophils Absolute: 0 K/uL (ref 0.0–0.1)
Basophils Relative: 0.6 % (ref 0.0–3.0)
Eosinophils Absolute: 0.2 K/uL (ref 0.0–0.7)
Eosinophils Relative: 2.6 % (ref 0.0–5.0)
HCT: 42.6 % (ref 36.0–46.0)
Hemoglobin: 13.8 g/dL (ref 12.0–15.0)
Lymphocytes Relative: 27.9 % (ref 12.0–46.0)
Lymphs Abs: 2.2 K/uL (ref 0.7–4.0)
MCHC: 32.5 g/dL (ref 30.0–36.0)
MCV: 87.6 fl (ref 78.0–100.0)
Monocytes Absolute: 0.6 K/uL (ref 0.1–1.0)
Monocytes Relative: 7.1 % (ref 3.0–12.0)
Neutro Abs: 4.9 K/uL (ref 1.4–7.7)
Neutrophils Relative %: 61.8 % (ref 43.0–77.0)
Platelets: 309 K/uL (ref 150.0–400.0)
RBC: 4.86 Mil/uL (ref 3.87–5.11)
RDW: 16.2 % — ABNORMAL HIGH (ref 11.5–15.5)
WBC: 7.9 K/uL (ref 4.0–10.5)

## 2024-03-11 LAB — LIPASE: Lipase: 130 U/L — ABNORMAL HIGH (ref 11.0–59.0)

## 2024-03-11 MED ORDER — ALPRAZOLAM 0.5 MG PO TABS
0.5000 mg | ORAL_TABLET | Freq: Two times a day (BID) | ORAL | 1 refills | Status: AC | PRN
  Filled 2024-03-11: qty 60, 30d supply, fill #0

## 2024-03-11 NOTE — Patient Instructions (Addendum)
 Go to the lab on the way out.   If you have mychart we'll likely use that to update you.    Take care.  Glad to see you. If you have more episodes or if your lipase is elevated, then stop trulicity .  Let me know if you have more symptoms.

## 2024-03-11 NOTE — Progress Notes (Signed)
 [facility] - Clinic Note  03/18/2024     CHIEF COMPLAINT Patient presents for Retina Follow Up   HISTORY OF PRESENT ILLNESS: Natasha Franco is a 73 y.o. female who presents to the clinic today for:   HPI     Retina Follow Up   Patient presents with  Other.  In right eye.  This started 4 weeks ago.  I, the attending physician,  performed the HPI with the patient and updated documentation appropriately.        Comments   Patient here for 4 weeks retina follow up for CME OD. Patient states vision can tell OD still not great. No eye pain. Taken off Trulicity .       Last edited by [name], MD on 03/24/2024  1:10 AM.    Pt states  Referring physician: [name] MD [address] [place], [place] [address]  HISTORICAL INFORMATION:   Selected notes from the MEDICAL RECORD NUMBER Referred by [name] for retinoschisis OD LEE:  Ocular Hx- HH plaque X2 OS (2018), cataracts, progressive retinoschisis OD PMH-    CURRENT MEDICATIONS: Current Outpatient Medications (Ophthalmic Drugs)  Medication Sig   prednisoLONE  acetate (PRED FORTE ) 1 % ophthalmic suspension Place 1 drop into the right eye 4 (four) times daily.   timolol  (TIMOPTIC ) 0.5 % ophthalmic solution Place 1 drop into the right eye 2 (two) times daily.   No current facility-administered medications for this visit. (Ophthalmic Drugs)   Current Outpatient Medications (Other)  Medication Sig   albuterol (VENTOLIN HFA) 108 (90 Base) MCG/ACT inhaler INHALE 1 TO 2 PUFFS BY MOUTH EVERY 4 TO 6 HOURS AS NEEDED FOR COUGH OR WHEEZING   ALPRAZolam  (XANAX ) 0.5 MG tablet Take 1 tablet (0.5 mg total) by mouth 2 (two) times daily as needed.   azelastine  (ASTELIN ) 0.1 % nasal spray Place 2 sprays into both nostrils 2 (two) times daily. Use in each nostril as directed/PRN   Blood Glucose Monitoring Suppl (ACCU-CHEK GUIDE) w/Device KIT Use to check blood sugar daily. Dx E11.9   colchicine  0.6 MG tablet  Take 1 tablet (0.6 mg total) by mouth daily as needed.   Continuous Glucose Receiver (DEXCOM G7 RECEIVER) DEVI 1 each by Does not apply route daily.   Continuous Glucose Sensor (DEXCOM G7 SENSOR) MISC 1 each by Does not apply route at bedtime.   empagliflozin  (JARDIANCE ) 25 MG TABS tablet Take 1 tablet (25 mg total) by mouth daily.   escitalopram  (LEXAPRO ) 20 MG tablet Take 1 tablet (20 mg total) by mouth at bedtime.   estradiol  (ESTRACE  VAGINAL) 0.1 MG/GM vaginal cream Place 1 Applicatorful vaginally 3 (three) times a week.   Evolocumab  (REPATHA  SURECLICK) 140 MG/ML SOAJ Inject 140 mg into the skin every 14 (fourteen) days.   fluticasone  (FLONASE ) 50 MCG/ACT nasal spray Place 2 sprays into both nostrils 2 (two) times daily. PRN   fluticasone -salmeterol (WIXELA INHUB) 250-50 MCG/ACT AEPB Inhale 1 puff into the lungs in the morning and at bedtime. PRN   glucose blood test strip Use as instructed to check blood sugar daily. Dx E11.9   ibuprofen  (ADVIL ) 800 MG tablet Take 1 tablet (800 mg total) by mouth 3 (three) times daily with meals as needed.   insulin  glargine (LANTUS  SOLOSTAR) 100 UNIT/ML Solostar Pen Inject 26 Units into the skin daily.   Insulin  Pen Needle 30G X 5 MM MISC Use daily with insulin  injection   ipratropium (ATROVENT ) 0.06 % nasal spray Place 2 sprays  into both nostrils 3 (three) times daily as needed for rhinitis.   levocetirizine (XYZAL ) 5 MG tablet Take 1 tablet (5 mg total) by mouth every evening.   metFORMIN  (GLUCOPHAGE ) 500 MG tablet Take 1 tablet (500 mg total) by mouth at bedtime.   montelukast  (SINGULAIR ) 10 MG tablet Take 1 tablet (10 mg total) by mouth at bedtime.   ondansetron  (ZOFRAN ) 4 MG tablet Take 1 tablet (4 mg total) by mouth every 8 (eight) hours as needed for nausea or vomiting.   pantoprazole  (PROTONIX ) 40 MG tablet Take 1 tablet (40 mg total) by mouth 2 (two) times daily 30 minutes before meals.   topiramate  (TOPAMAX ) 100 MG tablet Take 1 tablet (100 mg  total) by mouth at bedtime.   traZODone  (DESYREL ) 50 MG tablet Take 1 tablet (50 mg total) by mouth at bedtime.   ursodiol  (ACTIGALL ) 300 MG capsule Take 2 capsules (600 mg total) by mouth 2 (two) times daily.   Accu-Chek Softclix Lancets lancets Use to check blood sugar daily. Dx E11.9   No current facility-administered medications for this visit. (Other)   REVIEW OF SYSTEMS: ROS   Positive for: Gastrointestinal, Neurological, Musculoskeletal, Endocrine, Eyes Negative for: Constitutional, Skin, Genitourinary, HENT, Cardiovascular, Respiratory, Psychiatric, Allergic/Imm, Heme/Lymph Last edited by Orval Asberry RAMAN, COA on 03/18/2024  9:46 AM.     ALLERGIES Allergies  Allergen Reactions   Invokana [Canagliflozin]     VAGINAL YEAST INFECTIONS   Metformin  And Related     GI upset at higher doses   Ozempic  (0.25 Or 0.5 Mg-Dose) [Semaglutide (0.25 Or 0.5mg -Dos)]     GI intolerance.  Has tolerated trulicity  0.75mg  per dose.    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   BLEPHAROPLASTY Bilateral    upper and lower   BREAST BIOPSY Left 2014   BREAST SURGERY Bilateral 1984   reduction mammoplasty   CARPAL TUNNEL RELEASE Right    CATARACT EXTRACTION     CESAREAN SECTION     x 2   FACIAL COSMETIC SURGERY      KNEE ARTHROSCOPY Left    REDUCTION MAMMAPLASTY Bilateral    ROTATOR CUFF REPAIR Right    TONSILLECTOMY     TOTAL KNEE ARTHROPLASTY Left 03/25/2019   Procedure: Left Knee Arthroplasty;  Surgeon: Sheril Coy, MD;  Location: [facility];  Service: Orthopedics;  Laterality: Left;   VAGINAL HYSTERECTOMY     FAMILY HISTORY Family History  Problem Relation Age of Onset   Arthritis Mother    Early death Father    Diabetes Father    Alcohol abuse Father    COPD Father    Heart  attack Father    Mental illness Father    Alcohol abuse Sister    Stroke Maternal Grandmother    Stroke Maternal Grandfather    Diabetes Maternal Grandfather    Diabetes Paternal Grandfather    Anxiety disorder Daughter    Hypotension Daughter        POTS   Colon cancer Neg Hx    Liver cancer Neg Hx    Stomach cancer Neg Hx    Esophageal cancer Neg Hx    Rectal cancer Neg Hx    Breast cancer Neg Hx    SOCIAL HISTORY Social History   Tobacco Use   Smoking status: Former    Current packs/day: 0.00    Average packs/day: 1 pack/day for 25.0 years (25.0 ttl pk-yrs)    Types: Cigarettes    Start date: 09/01/1981    Quit date: 09/01/2006    Years since quitting: 17.5   Smokeless tobacco: Never  Vaping Use   Vaping status: Never Used  Substance Use Topics   Alcohol use: Yes    Comment: occ   Drug use: No       OPHTHALMIC EXAM:  Base Eye Exam     Visual Acuity (Snellen - Linear)       Right Left   Dist [place] 20/25 -2 20/20 -2         Tonometry (Tonopen, 9:43 AM)       Right Left   Pressure 20 15         Pupils       Dark Light Shape React APD   Right 3 2 Round Brisk None   Left 3 2 Round Brisk None         Visual Fields (Counting fingers)       Left Right    Full Full         Extraocular Movement       Right Left    Full, Ortho Full, Ortho         Neuro/Psych     Oriented x3: Yes   Mood/Affect: Normal         Dilation     Both eyes: 1.0% Mydriacyl, 2.5%  Phenylephrine  @ 9:43 AM           Slit Lamp and Fundus Exam     Slit Lamp Exam       Right Left   Lids/Lashes Dermatochalasis - upper lid Dermatochalasis - upper lid, mild MGD, Telangiectasia   Conjunctiva/Sclera temporal pinguecula, STK ST quad White and quiet   Cornea well healed cataract wound, trace tear film debris, 1+ Punctate epithelial erosions inferiorly tear film debris, well healed cataract wound, 1+ fine  Punctate epithelial erosions   Anterior Chamber Deep, no cell or flare deep and clear, 0.5+ fine cell and pigment   Iris Round and dilated Round and dilated   Lens PC IOL in good position PC IOL in good position   Anterior Vitreous Vitreous syneresis, Posterior vitreous detachment, pigmented debris settled inferiory, vitreous condensations Vitreous syneresis, no pigment, Posterior vitreous detachment, vitreous condensations         Fundus Exam       Right Left   Disc Pink and Sharp, mild tilt, inferior PPA Pink and Sharp, tilted, inferior PPA   C/D Ratio 0.5 0.5   Macula Flat, Good foveal reflex, trace cystic changes--improved, mild RPE mottling greatest centrally, No heme Flat, Blunted foveal reflex, mild RPE mottling, No heme or edema   Vessels attenuated,  Tortuous, mild AV crossing changes attenuated, mild tortuosity   Periphery Attached, inferior schisis from 0400-0730 with pigment clumping and pigment atrophy within schisis cavity, outer retinal hole at 0700 -- good barricade laser changes posterior to schisis, no new RT/RD/lattice or schisis Attached, very shallow schisis IT periphery, mild reticular degeneration           IMAGING AND PROCEDURES  Imaging and Procedures for 03/18/2024  OCT, Retina - OU - Both Eyes       Right Eye Quality was good. Central Foveal Thickness: 295. Progression has improved. Findings include normal foveal contour, no IRF, no SRF (Interval resolution of cystic changes inferior macula, mild patchy ellipsoid signal centrally, IT  schisis with outer retinal holes caught on widefield--not imaged today).   Left Eye Quality was good. Central Foveal Thickness: 281. Progression has been stable. Findings include normal foveal contour, no IRF, no SRF, vitreomacular adhesion (Shallow schisis IT periphery caught on widefield - not imaged today).   Notes *Images captured and stored on drive  Diagnosis / Impression:  OD: Interval resolution of cystic changes inferior macula, mild patchy ellipsoid signal centrally, IT schisis with outer retinal holes caught on widefield--not imaged today OS: NFP; no IRF/SRF; Shallow schisis IT periphery caught on widefield -- not imaged today  Clinical management:  See below  Abbreviations: NFP - Normal foveal profile. CME - cystoid macular edema. PED - pigment epithelial detachment. IRF - intraretinal fluid. SRF - subretinal fluid. EZ - ellipsoid zone. ERM - epiretinal membrane. ORA - outer retinal atrophy. ORT - outer retinal tubulation. SRHM - subretinal hyper-reflective material. IRHM - intraretinal hyper-reflective material             ASSESSMENT/PLAN:    ICD-10-CM   1. 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 Diabetes mellitus type 2 without retinopathy (HCC)  E11.9     5. Long term (current) use of oral hypoglycemic drugs  Z79.84     6. Long-term (current) use of injectable non-insulin  antidiabetic drugs  Z79.85     7. Pseudophakia, both eyes  Z96.1     8. Dry eyes  H04.123      1. CME OD - development of IRF and sliver of SRF centrally -- consistent with CME, noted on 07.31.23 visit - suspect delayed [place], s/p CEIOL with Dr. Leslee in March 2023 - started on PF and Ketorolac  QID OD on 07.31.23 - recurrent CME following taper after 08.22.23 visit - referred back 07.10.25 by [name] for recurrent CME -- noted on 06.10.25 and pt was started  on PF and timolol  - s/p STK OD #1 (11.15.23), #2 (08.07.25) - today, OCT shows interval resolution of cystic changes inferior macula - BCVA OD 20/25-improved from 20/30   - recommend reducing PF and Ketorolac  from QID to BID OD, no STK today.  - f/u in 6 wks - DFE/OCT  2,3. Retinoschisis OU - OD: inferior schisis 0400-0730 with pigment clumping and pigment atrophy within schisis cavity, outer retinal hole at 0700 - OS: very shallow schisis IT arcades  - pt reports intermittent photopsias and floaters OU -- likely unrelated to schisis - s/p laser retinopexy OD (01.30.23) -- good barricade laser changes posterior to schisis - no new RT/RD or schisis OU - monitor  4-6. Diabetes mellitus, type 2 without retinopathy  -  last A1c was 6.5 on 02.27.24 - The incidence, risk factors for progression, natural history and treatment options for diabetic retinopathy  were discussed with patient.   - The need for close monitoring of blood glucose, blood pressure, and serum lipids, avoiding cigarette or any type of tobacco, and the need for long term follow up was also discussed with patient. - f/u in 1 year, sooner prn  7. Pseudophakia OU  - s/p CE/IOL OU (Dr. Leslee, March / April 2023)  - IOLs in perfect position, doing well  - mild CME OD stably resolved as above  - monitor  8. Dry eyes OU (OD > OS) - recommend artificial tears and lubricating ointment as needed   Ophthalmic Meds Ordered this visit:  No orders of the defined types were placed in this encounter.    Return in about 6 weeks (around 04/29/2024) for CME OD, DFE, OCT.  There are no Patient Instructions on file for this visit.   Explained the diagnoses, plan, and follow up with the patient and they expressed understanding.  Patient expressed understanding of the importance of proper follow up care.   This document serves as a record of services personally performed by Redell JUDITHANN Hans, MD, PhD. It was created on their behalf by  Wanda GEANNIE Keens, COT an ophthalmic technician. The creation of this record is the provider's dictation and/or activities during the visit.    Electronically signed by:  Wanda GEANNIE Keens, COT  03/24/24 1:12 AM  Redell JUDITHANN Hans, M.D., Ph.D. Diseases & Surgery of the Retina and Vitreous [facility]  I have reviewed the above documentation for accuracy and completeness, and I agree with the above. Redell JUDITHANN Hans, M.D., Ph.D. 03/24/24 1:14 AM   Abbreviations: M myopia (nearsighted); A astigmatism; H hyperopia (farsighted); P presbyopia; Mrx spectacle prescription;  CTL contact lenses; OD right eye; OS left eye; OU both eyes  XT exotropia; ET esotropia; PEK punctate epithelial keratitis; PEE punctate epithelial erosions; DES dry eye syndrome; MGD meibomian gland dysfunction; ATs artificial tears; PFAT's preservative free artificial tears; NSC nuclear sclerotic cataract; PSC posterior subcapsular cataract; ERM epi-retinal membrane; PVD posterior vitreous detachment; RD retinal detachment; DM diabetes mellitus; DR diabetic retinopathy; NPDR non-proliferative diabetic retinopathy; PDR proliferative diabetic retinopathy; CSME clinically significant macular edema; DME diabetic macular edema; dbh dot blot hemorrhages; CWS cotton wool spot; POAG primary open angle glaucoma; C/D cup-to-disc ratio; HVF humphrey visual field; GVF goldmann visual field; OCT optical coherence tomography; IOP intraocular pressure; BRVO Branch retinal vein occlusion; CRVO central retinal vein occlusion; CRAO central retinal artery occlusion; BRAO branch retinal artery occlusion; RT retinal tear; SB scleral buckle; PPV pars plana vitrectomy; VH Vitreous hemorrhage; PRP panretinal laser photocoagulation; IVK intravitreal kenalog ; VMT vitreomacular traction; MH Macular hole;  NVD neovascularization of the disc; NVE neovascularization elsewhere; AREDS age related eye disease study; ARMD age related macular  degeneration; POAG primary open angle glaucoma; EBMD epithelial/anterior basement membrane dystrophy; ACIOL anterior chamber intraocular lens; IOL intraocular lens; PCIOL posterior chamber intraocular lens; Phaco/IOL phacoemulsification with intraocular lens placement; PRK photorefractive keratectomy; LASIK laser assisted in situ keratomileusis; HTN hypertension; DM diabetes mellitus; COPD chronic obstructive pulmonary disease

## 2024-03-11 NOTE — Progress Notes (Unsigned)
 She has had multiple similar episodes over the years.  She had prev cardiac eval, distant past.  Also had GI eval prev.    She was sitting at church, then had sx with belt tightening sensation in the B lateral thorax, wrapped around the back.  Had pain in the back.  It radiated to the jaw.  Not exertional.  Lasted 5 minutes.  No vomiting.  Felt hot at the time.  Sx at rest.  She can exercise w/o chest pain or similar sx.  She took ibuprofen  after the fact and used a heating pad on her back.    Prev MRCP IMPRESSION: 1. Hepatic steatosis. 2. No biliary ductal dilatation. No imaging stigmata of cirrhosis or primary biliary cholangitis. 3. No acute findings in the abdomen. 4. Small hiatal hernia.  She is taking trulicity .  She is monitoring her sugar with CGM.  Sugar usually <200 after meals.  Usually <<150 in the AMs.    She can have months at a time w/o episodes.  I asked her to track her symptoms.    She has no sx now except for some baseline back discomfort that is chronic.    She needed refill on xanax , sent at rx.    Meds, vitals, and allergies reviewed.   ROS: Per HPI unless specifically indicated in ROS section   GEN: nad, alert and oriented HEENT: ncat NECK: supple w/o LA CV: rrr.  PULM: ctab, no inc wob ABD: soft, +bs EXT: no edema SKIN: Well-perfused

## 2024-03-12 ENCOUNTER — Other Ambulatory Visit: Payer: Self-pay

## 2024-03-12 ENCOUNTER — Encounter: Payer: Self-pay | Admitting: Family Medicine

## 2024-03-12 DIAGNOSIS — M549 Dorsalgia, unspecified: Secondary | ICD-10-CM | POA: Insufficient documentation

## 2024-03-12 NOTE — Telephone Encounter (Signed)
 See result note.

## 2024-03-12 NOTE — Assessment & Plan Note (Signed)
 This could have been related to Trulicity .  Discussed that if her lipase is elevated or if she has any other episodes then to stop Trulicity  in the meantime.  See notes on labs.  At this point okay for outpatient follow-up.  Benign abdominal exam.

## 2024-03-13 ENCOUNTER — Ambulatory Visit: Payer: Self-pay | Admitting: Family Medicine

## 2024-03-13 NOTE — Telephone Encounter (Unsigned)
 Copied from CRM [id number]. Topic: General - Other >> Mar 13, 2024  8:40 AM Natasha Franco wrote: Reason for CRM: patient would  like to discuss her blood work because it came back abnormal CB [id number]

## 2024-03-14 ENCOUNTER — Encounter (INDEPENDENT_AMBULATORY_CARE_PROVIDER_SITE_OTHER): Admitting: Ophthalmology

## 2024-03-14 NOTE — Telephone Encounter (Signed)
 Closing encounter. Patient reviewed and saw Dr. Verdene notes

## 2024-03-18 ENCOUNTER — Ambulatory Visit (INDEPENDENT_AMBULATORY_CARE_PROVIDER_SITE_OTHER): Admitting: Ophthalmology

## 2024-03-18 ENCOUNTER — Encounter (INDEPENDENT_AMBULATORY_CARE_PROVIDER_SITE_OTHER): Payer: Self-pay | Admitting: Ophthalmology

## 2024-03-18 DIAGNOSIS — H33321 Round hole, right eye: Secondary | ICD-10-CM

## 2024-03-18 DIAGNOSIS — E119 Type 2 diabetes mellitus without complications: Secondary | ICD-10-CM | POA: Diagnosis not present

## 2024-03-18 DIAGNOSIS — Z7985 Long-term (current) use of injectable non-insulin antidiabetic drugs: Secondary | ICD-10-CM | POA: Diagnosis not present

## 2024-03-18 DIAGNOSIS — H33103 Unspecified retinoschisis, bilateral: Secondary | ICD-10-CM | POA: Diagnosis not present

## 2024-03-18 DIAGNOSIS — H04123 Dry eye syndrome of bilateral lacrimal glands: Secondary | ICD-10-CM

## 2024-03-18 DIAGNOSIS — H35351 Cystoid macular degeneration, right eye: Secondary | ICD-10-CM

## 2024-03-18 DIAGNOSIS — Z7984 Long term (current) use of oral hypoglycemic drugs: Secondary | ICD-10-CM | POA: Diagnosis not present

## 2024-03-18 DIAGNOSIS — Z961 Presence of intraocular lens: Secondary | ICD-10-CM | POA: Diagnosis not present

## 2024-03-18 MED FILL — Evolocumab Subcutaneous Soln Auto-Injector 140 MG/ML: SUBCUTANEOUS | 28 days supply | Qty: 2 | Fill #1 | Status: AC

## 2024-03-19 ENCOUNTER — Other Ambulatory Visit: Payer: Self-pay

## 2024-03-24 ENCOUNTER — Encounter (INDEPENDENT_AMBULATORY_CARE_PROVIDER_SITE_OTHER): Payer: Self-pay | Admitting: Ophthalmology

## 2024-03-26 ENCOUNTER — Other Ambulatory Visit: Payer: Self-pay

## 2024-03-28 ENCOUNTER — Other Ambulatory Visit: Payer: Self-pay

## 2024-04-02 ENCOUNTER — Other Ambulatory Visit: Payer: Self-pay

## 2024-04-02 NOTE — Progress Notes (Signed)
 Natasha Franco                                          MRN: [id number]   04/02/2024   The [facility] Quality Team Specialist reviewed this patient medical record for the purposes of chart review for care gap closure. The following were reviewed: abstraction for care gap closure-kidney health evaluation for diabetes:eGFR  and uACR.    [facility] Quality Team

## 2024-04-03 ENCOUNTER — Other Ambulatory Visit: Payer: Self-pay | Admitting: Family Medicine

## 2024-04-03 ENCOUNTER — Other Ambulatory Visit: Payer: Self-pay

## 2024-04-03 MED ORDER — METFORMIN HCL 500 MG PO TABS: 500.0000 mg | ORAL_TABLET | Freq: Every day | ORAL | Status: DC

## 2024-04-03 NOTE — Telephone Encounter (Signed)
 Copied from CRM #[id number]. Topic: Clinical - Medication Refill >> Apr 03, 2024  9:38 AM Timindy P wrote: Medication: metFORMIN  (GLUCOPHAGE ) 500 MG tablet  Has the patient contacted their pharmacy? Yes (Agent: If no, request that the patient contact the pharmacy for the refill. If patient does not wish to contact the pharmacy document the reason why and proceed with request.) (Agent: If yes, when and what did the pharmacy advise?)  This is the patient's preferred pharmacy:  [facility] - [facility] [address] [place] [address] Phone: [phone] Fax: [phone]  Is this the correct pharmacy for this prescription? Yes If no, delete pharmacy and type the correct one.   Has the prescription been filled recently? No  Is the patient out of the medication? Yes  Has the patient been seen for an appointment in the last year OR does the patient have an upcoming appointment? Yes  Can we respond through MyChart? Yes  Agent: Please be advised that Rx refills may take up to 3 business days. We ask that you follow-up with your pharmacy.

## 2024-04-11 ENCOUNTER — Other Ambulatory Visit: Payer: Self-pay | Admitting: Family Medicine

## 2024-04-11 ENCOUNTER — Other Ambulatory Visit: Payer: Self-pay

## 2024-04-11 ENCOUNTER — Encounter: Payer: Self-pay | Admitting: Family Medicine

## 2024-04-11 MED ORDER — LEVOCETIRIZINE DIHYDROCHLORIDE 5 MG PO TABS
5.0000 mg | ORAL_TABLET | Freq: Every evening | ORAL | 6 refills | Status: AC
  Filled 2024-04-11: qty 30, 30d supply, fill #0
  Filled 2024-05-09: qty 30, 30d supply, fill #1
  Filled 2024-06-09: qty 30, 30d supply, fill #2
  Filled 2024-07-15: qty 30, 30d supply, fill #3

## 2024-04-11 MED FILL — Evolocumab Subcutaneous Soln Auto-Injector 140 MG/ML: SUBCUTANEOUS | 28 days supply | Qty: 2 | Fill #2 | Status: AC

## 2024-04-11 NOTE — Telephone Encounter (Signed)
 Copied from CRM #[id number]. Topic: Clinical - Prescription Issue >> Apr 11, 2024 12:27 PM Natasha Franco wrote: Reason for CRM: Patient states these medications were sent to the wrong pharmacy and need to be sent to   [facility] - [facility] [address] [place] [address] Phone: [phone] Fax: [phone] Hours: Mon-Fri 7:30a-7p; Sat 8a-4:30p; Sun 10a-2p   metFORMIN  (GLUCOPHAGE ) 500 MG tablet empagliflozin  (JARDIANCE ) 25 MG TABS tablet   Also states she used the last refill on the Lantus  as well.

## 2024-04-11 NOTE — Telephone Encounter (Signed)
 Patient arrived in clinic this morning asking why her prescription is being denied at pharmacy.  Ask that clinic team to call pharmacy so she can get her rx refilled for her Jardiance  and Metformin . States she is out and has used the last of the [place]

## 2024-04-11 NOTE — Telephone Encounter (Signed)
 Copied from CRM [id number]. Topic: Clinical - Medication Refill >> Apr 11, 2024  9:17 AM Pinkey ORN wrote: Medication: empagliflozin  (JARDIANCE ) 25 MG TABS tablet,   Has the patient contacted their pharmacy? Yes (Agent: If no, request that the patient contact the pharmacy for the refill. If patient does not wish to contact the pharmacy document the reason why and proceed with request.) (Agent: If yes, when and what did the pharmacy advise?)  This is the patient's preferred pharmacy:  [facility] - [facility] [address] [place] [address] Phone: [phone] Fax: [phone]  Is this the correct pharmacy for this prescription? Yes If no, delete pharmacy and type the correct one.   Has the prescription been filled recently? No  Is the patient out of the medication? Yes  Has the patient been seen for an appointment in the last year OR does the patient have an upcoming appointment? Yes  Can we respond through MyChart? Yes  Agent: Please be advised that Rx refills may take up to 3 business days. We ask that you follow-up with your pharmacy.

## 2024-04-14 ENCOUNTER — Other Ambulatory Visit: Payer: Self-pay

## 2024-04-14 MED ORDER — METFORMIN HCL 500 MG PO TABS: 500.0000 mg | ORAL_TABLET | Freq: Every day | ORAL | 3 refills | Status: DC

## 2024-04-14 MED ORDER — EMPAGLIFLOZIN 25 MG PO TABS
25.0000 mg | ORAL_TABLET | Freq: Every day | ORAL | 3 refills | Status: DC
  Filled 2024-04-14: qty 90, 90d supply, fill #0

## 2024-04-14 MED ORDER — METFORMIN HCL 500 MG PO TABS
500.0000 mg | ORAL_TABLET | Freq: Every day | ORAL | 3 refills | Status: AC
  Filled 2024-04-14: qty 90, 90d supply, fill #0
  Filled 2024-07-14: qty 90, 90d supply, fill #1

## 2024-04-14 MED ORDER — LANTUS SOLOSTAR 100 UNIT/ML ~~LOC~~ SOPN
26.0000 [IU] | PEN_INJECTOR | Freq: Every day | SUBCUTANEOUS | 3 refills | Status: DC
  Filled 2024-04-14: qty 15, 57d supply, fill #0
  Filled 2024-06-09: qty 15, 57d supply, fill #1

## 2024-04-14 MED ORDER — METFORMIN HCL 500 MG PO TABS: 500.0000 mg | ORAL_TABLET | Freq: Every day | ORAL | Status: DC

## 2024-04-14 NOTE — Telephone Encounter (Signed)
 Rxs were sent to [facility] pharmacy today.

## 2024-04-14 NOTE — Telephone Encounter (Signed)
 Rx sent to [facility] today.

## 2024-04-21 NOTE — Progress Notes (Signed)
 [facility] - Clinic Note  05/05/2024     CHIEF COMPLAINT Patient presents for Retina Follow Up   HISTORY OF PRESENT ILLNESS: Natasha Franco is a 73 y.o. female who presents to the clinic today for:   HPI     Retina Follow Up   In right eye.  This started 6 weeks ago.  Duration of 6 weeks.  Since onset it is stable.        Comments   6 week retia follow up CME OD pt is reporting no vision changes noticed she denies any flashes or floaters pt is diabetic her last reading 110 in office A1C 7.2 she is using PF and Ketorolac  bid OD       Last edited by Resa Delon ORN, COT on 05/05/2024  2:01 PM.     Pt states she's been compliant w/ using gtts BID OD. VA seems stable  Referring physician: [name] MD [id number] [place], [place] [address]  HISTORICAL INFORMATION:   Selected notes from the MEDICAL RECORD NUMBER Referred by [name] for retinoschisis OD LEE:  Ocular Hx- HH plaque X2 OS (2018), cataracts, progressive retinoschisis OD PMH-    CURRENT MEDICATIONS: Current Outpatient Medications (Ophthalmic Drugs)  Medication Sig   prednisoLONE  acetate (PRED FORTE ) 1 % ophthalmic suspension Place 1 drop into the right eye 4 (four) times daily.   timolol  (TIMOPTIC ) 0.5 % ophthalmic solution Place 1 drop into the right eye 2 (two) times daily.   No current facility-administered medications for this visit. (Ophthalmic Drugs)   Current Outpatient Medications (Other)  Medication Sig   Accu-Chek Softclix Lancets lancets Use to check blood sugar daily. Dx E11.9   albuterol (VENTOLIN HFA) 108 (90 Base) MCG/ACT inhaler INHALE 1 TO 2 PUFFS BY MOUTH EVERY 4 TO 6 HOURS AS NEEDED FOR COUGH OR WHEEZING   ALPRAZolam  (XANAX ) 0.5 MG tablet Take 1 tablet (0.5 mg total) by mouth 2 (two) times daily as needed.   azelastine  (ASTELIN ) 0.1 % nasal spray Place 2 sprays into both nostrils 2 (two) times daily. Use in each nostril as directed/PRN   Blood  Glucose Monitoring Suppl (ACCU-CHEK GUIDE) w/Device KIT Use to check blood sugar daily. Dx E11.9   colchicine  0.6 MG tablet Take 1 tablet (0.6 mg total) by mouth daily as needed.   Continuous Glucose Receiver (DEXCOM G7 RECEIVER) DEVI 1 each by Does not apply route daily.   Continuous Glucose Sensor (DEXCOM G7 SENSOR) MISC 1 each by Does not apply route at bedtime.   empagliflozin  (JARDIANCE ) 25 MG TABS tablet Take 1 tablet (25 mg total) by mouth daily.   escitalopram  (LEXAPRO ) 20 MG tablet Take 1 tablet (20 mg total) by mouth at bedtime.   estradiol  (ESTRACE  VAGINAL) 0.1 MG/GM vaginal cream Place 1 Applicatorful vaginally 3 (three) times a week.   Evolocumab  (REPATHA  SURECLICK) 140 MG/ML SOAJ Inject 140 mg into the skin every 14 (fourteen) days.   fluticasone  (FLONASE ) 50 MCG/ACT nasal spray Place 2 sprays into both nostrils 2 (two) times daily. PRN   fluticasone -salmeterol (WIXELA INHUB) 250-50 MCG/ACT AEPB Inhale 1 puff into the lungs in the morning and at bedtime. PRN   glucose blood test strip Use as instructed to check blood sugar daily. Dx E11.9   ibuprofen  (ADVIL ) 800 MG tablet Take 1 tablet (800 mg total) by mouth 3 (three) times daily with meals as needed.   insulin  glargine (LANTUS  SOLOSTAR) 100 UNIT/ML Solostar Pen Inject 26 Units into  the skin daily.   Insulin  Pen Needle 30G X 5 MM MISC Use daily with insulin  injection   ipratropium (ATROVENT ) 0.06 % nasal spray Place 2 sprays into both nostrils 3 (three) times daily as needed for rhinitis.   levocetirizine (XYZAL ) 5 MG tablet Take 1 tablet (5 mg total) by mouth every evening.   levocetirizine (XYZAL ) 5 MG tablet Take 1 tablet (5 mg total) by mouth every evening.   metFORMIN  (GLUCOPHAGE ) 500 MG tablet Take 1 tablet (500 mg total) by mouth at bedtime.   montelukast  (SINGULAIR ) 10 MG tablet Take 1 tablet (10 mg total) by mouth at bedtime.   ondansetron  (ZOFRAN ) 4 MG tablet Take 1 tablet (4 mg total) by mouth every 8 (eight) hours as  needed for nausea or vomiting.   pantoprazole  (PROTONIX ) 40 MG tablet Take 1 tablet (40 mg total) by mouth 2 (two) times daily 30 minutes before meals.   topiramate  (TOPAMAX ) 100 MG tablet Take 1 tablet (100 mg total) by mouth at bedtime.   traZODone  (DESYREL ) 50 MG tablet Take 1 tablet (50 mg total) by mouth at bedtime.   ursodiol  (ACTIGALL ) 300 MG capsule Take 2 capsules (600 mg total) by mouth 2 (two) times daily.   No current facility-administered medications for this visit. (Other)   REVIEW OF SYSTEMS: ROS   Positive for: Gastrointestinal, Neurological, Musculoskeletal, Endocrine, Eyes Negative for: Constitutional, Skin, Genitourinary, HENT, Cardiovascular, Respiratory, Psychiatric, Allergic/Imm, Heme/Lymph Last edited by Resa Delon ORN, COT on 05/05/2024  1:59 PM.      ALLERGIES Allergies  Allergen Reactions   Invokana [Canagliflozin]     VAGINAL YEAST INFECTIONS   Metformin  And Related     GI upset at higher doses   Ozempic  (0.25 Or 0.5 Mg-Dose) [Semaglutide (0.25 Or 0.5mg -Dos)]     GI intolerance.  Has tolerated trulicity  0.75mg  per dose.    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   BLEPHAROPLASTY Bilateral    upper and lower   BREAST BIOPSY Left 2014   BREAST SURGERY Bilateral  1984   reduction mammoplasty   CARPAL TUNNEL RELEASE Right    CATARACT EXTRACTION     CESAREAN SECTION     x 2   FACIAL COSMETIC SURGERY     KNEE ARTHROSCOPY Left    REDUCTION MAMMAPLASTY Bilateral    ROTATOR CUFF REPAIR Right    TONSILLECTOMY     TOTAL KNEE ARTHROPLASTY Left 03/25/2019   Procedure: Left Knee Arthroplasty;  Surgeon: Sheril Coy, MD;  Location: [facility];  Service: Orthopedics;  Laterality: Left;   VAGINAL HYSTERECTOMY     FAMILY HISTORY Family History  Problem Relation  Age of Onset   Arthritis Mother    Early death Father    Diabetes Father    Alcohol abuse Father    COPD Father    Heart attack Father    Mental illness Father    Alcohol abuse Sister    Stroke Maternal Grandmother    Stroke Maternal Grandfather    Diabetes Maternal Grandfather    Diabetes Paternal Grandfather    Anxiety disorder Daughter    Hypotension Daughter        POTS   Colon cancer Neg Hx    Liver cancer Neg Hx    Stomach cancer Neg Hx    Esophageal cancer Neg Hx    Rectal cancer Neg Hx    Breast cancer Neg Hx    SOCIAL HISTORY Social History   Tobacco Use   Smoking status: Former    Current packs/day: 0.00    Average packs/day: 1 pack/day for 25.0 years (25.0 ttl pk-yrs)    Types: Cigarettes    Start date: 09/01/1981    Quit date: 09/01/2006    Years since quitting: 17.6   Smokeless tobacco: Never  Vaping Use   Vaping status: Never Used  Substance Use Topics   Alcohol use: Yes    Comment: occ   Drug use: No       OPHTHALMIC EXAM:  Base Eye Exam     Visual Acuity (Snellen - Linear)       Right Left   Dist [place] 20/30 20/25   Dist ph [place] 20/25 NI         Tonometry (Tonopen, 2:09 PM)       Right Left   Pressure 17 16         Pupils       Pupils Dark Light Shape React APD   Right PERRL 3 2 Round Brisk None   Left PERRL 3 2 Round Brisk None         Visual Fields       Left Right    Full Full         Extraocular Movement       Right Left     Full, Ortho Full, Ortho         Neuro/Psych     Oriented x3: Yes   Mood/Affect: Normal         Dilation     Both eyes: 2.5% Phenylephrine  @ 2:03 PM           Slit Lamp and Fundus Exam     Slit Lamp Exam       Right Left   Lids/Lashes Dermatochalasis - upper lid Dermatochalasis - upper lid, mild MGD, Telangiectasia   Conjunctiva/Sclera temporal pinguecula, STK ST quad White and quiet   Cornea well healed cataract wound, trace tear film debris, 1+ Punctate epithelial erosions inferiorly tear film debris, well healed cataract wound, 1+ fine  Punctate epithelial erosions   Anterior Chamber Deep, no cell or flare deep and clear, 0.5+ fine cell and pigment   Iris Round and dilated Round and dilated   Lens PC IOL in good position PC IOL in good position   Anterior Vitreous Vitreous syneresis, Posterior vitreous detachment, pigmented debris settled inferiory, vitreous condensations Vitreous syneresis, no pigment, Posterior vitreous detachment, vitreous condensations         Fundus Exam       Right Left   Disc Pink and Sharp, mild tilt, inferior PPA Pink and Sharp, tilted, inferior PPA  C/D Ratio 0.5 0.5   Macula Flat, Good foveal reflex, trace cystic changes--improved, mild RPE mottling greatest centrally, No heme Flat, Blunted foveal reflex, mild RPE mottling, No heme or edema   Vessels attenuated, Tortuous, mild AV crossing changes attenuated, mild tortuosity   Periphery Attached, inferior schisis from 0400-0730 with pigment clumping and pigment atrophy within schisis cavity, outer retinal hole at 0700 -- good barricade laser changes posterior to schisis, no new RT/RD/lattice or schisis Attached, very shallow schisis IT periphery, mild reticular degeneration           IMAGING AND PROCEDURES  Imaging and Procedures for 05/05/2024           ASSESSMENT/PLAN:    ICD-10-CM   1. 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 Diabetes mellitus type 2 without retinopathy (HCC)  E11.9     5. Long term (current) use of oral hypoglycemic drugs  Z79.84     6. Long-term (current) use of injectable non-insulin  antidiabetic drugs  Z79.85     7. Pseudophakia, both eyes  Z96.1     8. Dry eyes  H04.123      1. CME OD - development of IRF and sliver of SRF centrally -- consistent with CME, noted on 07.31.23 visit - suspect delayed [place], s/p CEIOL with Dr. Leslee in March 2023 - started on PF and Ketorolac  QID OD on 07.31.23 - recurrent CME following taper after 08.22.23 visit - referred back 07.10.25 by [name] for recurrent CME -- noted on 06.10.25 and pt was started on PF and timolol  - s/p STK OD #1 (11.15.23), #2 (08.07.25) - today, OCT shows interval resolution of cystic changes inferior macula - BCVA OD 20/25 stable - recommend reducing PF and Ketorolac  from BID to once daily OD, no STK today.  - f/u in 2-85months - DFE/OCT  2,3. Retinoschisis OU - OD: inferior schisis 0400-0730 with pigment clumping and pigment atrophy within schisis cavity, outer retinal hole at 0700 - OS: very shallow schisis IT arcades  - pt reports intermittent photopsias and floaters OU -- likely unrelated to schisis - s/p laser retinopexy OD (01.30.23) -- good barricade laser changes posterior to schisis - no new RT/RD or schisis OU - monitor  4-6. Diabetes mellitus, type 2 without retinopathy  - A1c was 7.2 (08.01.25), 6.5 (2.27.24) - The incidence, risk factors for progression, natural history and treatment options for diabetic retinopathy  were discussed with patient.   - The need for close monitoring of blood glucose, blood pressure, and serum lipids, avoiding cigarette or any type of tobacco, and the need for long term follow up was also discussed with patient. - f/u in 1 year, sooner prn  7. Pseudophakia OU  - s/p CE/IOL OU (Dr.  Leslee, March / April 2023)  - IOLs in perfect position, doing well  - mild CME OD stably resolved as above  - monitor  8. Dry eyes OU (OD > OS) - recommend artificial tears and lubricating ointment as needed   Ophthalmic Meds Ordered this visit:  No orders of the defined types were placed in this encounter.    No follow-ups on file.  There are no Patient Instructions on file for this visit.   Explained the diagnoses, plan, and follow up with the patient and they expressed understanding.  Patient expressed understanding of the importance of proper follow up care.   This document serves as a record of services personally performed by Redell JUDITHANN Hans, MD, PhD. It was created on their behalf by Avelina Pereyra, COA an ophthalmic technician. The creation of this record is the provider's dictation and/or activities during the visit.   Electronically signed by: Avelina GORMAN Pereyra, COT  05/05/24  3:10 PM   This document serves as a record of services personally performed by Redell JUDITHANN Hans, MD, PhD. It was created on their behalf by Wanda GEANNIE Keens, COT an ophthalmic technician. The creation of this record is the provider's dictation and/or activities during the visit.    Electronically signed by:  Wanda GEANNIE Keens, COT  05/05/24 3:10 PM  Redell JUDITHANN Hans, M.D., Ph.D. Diseases & Surgery of the Retina and Vitreous [facility]    Abbreviations: M myopia (nearsighted); A astigmatism; H hyperopia (farsighted); P presbyopia; Mrx spectacle prescription;  CTL contact lenses; OD right eye; OS left eye; OU both eyes  XT exotropia; ET esotropia; PEK punctate epithelial keratitis; PEE punctate epithelial erosions; DES dry eye syndrome; MGD meibomian gland dysfunction; ATs artificial tears; PFAT's preservative free artificial tears; NSC nuclear sclerotic cataract; PSC posterior subcapsular cataract; ERM epi-retinal membrane; PVD posterior vitreous detachment; RD retinal  detachment; DM diabetes mellitus; DR diabetic retinopathy; NPDR non-proliferative diabetic retinopathy; PDR proliferative diabetic retinopathy; CSME clinically significant macular edema; DME diabetic macular edema; dbh dot blot hemorrhages; CWS cotton wool spot; POAG primary open angle glaucoma; C/D cup-to-disc ratio; HVF humphrey visual field; GVF goldmann visual field; OCT optical coherence tomography; IOP intraocular pressure; BRVO Branch retinal vein occlusion; CRVO central retinal vein occlusion; CRAO central retinal artery occlusion; BRAO branch retinal artery occlusion; RT retinal tear; SB scleral buckle; PPV pars plana vitrectomy; VH Vitreous hemorrhage; PRP panretinal laser photocoagulation; IVK intravitreal kenalog ; VMT vitreomacular traction; MH Macular hole;  NVD neovascularization of the disc; NVE neovascularization elsewhere; AREDS age related eye disease study; ARMD age related macular degeneration; POAG primary open angle glaucoma; EBMD epithelial/anterior basement membrane dystrophy; ACIOL anterior chamber intraocular lens; IOL intraocular lens; PCIOL posterior chamber intraocular lens; Phaco/IOL phacoemulsification with intraocular lens placement; PRK photorefractive keratectomy; LASIK laser assisted in situ keratomileusis; HTN hypertension; DM diabetes mellitus; COPD chronic obstructive pulmonary disease

## 2024-04-28 ENCOUNTER — Encounter (INDEPENDENT_AMBULATORY_CARE_PROVIDER_SITE_OTHER): Admitting: Ophthalmology

## 2024-05-05 ENCOUNTER — Other Ambulatory Visit: Payer: Self-pay

## 2024-05-05 ENCOUNTER — Ambulatory Visit (INDEPENDENT_AMBULATORY_CARE_PROVIDER_SITE_OTHER): Admitting: Ophthalmology

## 2024-05-05 ENCOUNTER — Encounter (INDEPENDENT_AMBULATORY_CARE_PROVIDER_SITE_OTHER): Payer: Self-pay | Admitting: Ophthalmology

## 2024-05-05 DIAGNOSIS — E119 Type 2 diabetes mellitus without complications: Secondary | ICD-10-CM

## 2024-05-05 DIAGNOSIS — H33321 Round hole, right eye: Secondary | ICD-10-CM | POA: Diagnosis not present

## 2024-05-05 DIAGNOSIS — Z7985 Long-term (current) use of injectable non-insulin antidiabetic drugs: Secondary | ICD-10-CM

## 2024-05-05 DIAGNOSIS — Z7984 Long term (current) use of oral hypoglycemic drugs: Secondary | ICD-10-CM

## 2024-05-05 DIAGNOSIS — H35351 Cystoid macular degeneration, right eye: Secondary | ICD-10-CM

## 2024-05-05 DIAGNOSIS — H33103 Unspecified retinoschisis, bilateral: Secondary | ICD-10-CM | POA: Diagnosis not present

## 2024-05-05 DIAGNOSIS — Z961 Presence of intraocular lens: Secondary | ICD-10-CM

## 2024-05-05 DIAGNOSIS — H04123 Dry eye syndrome of bilateral lacrimal glands: Secondary | ICD-10-CM

## 2024-05-05 MED ORDER — PREDNISOLONE ACETATE 1 % OP SUSP
1.0000 [drp] | Freq: Every day | OPHTHALMIC | 3 refills | Status: AC
  Filled 2024-05-05: qty 5, 50d supply, fill #0

## 2024-05-05 MED ORDER — KETOROLAC TROMETHAMINE 0.5 % OP SOLN
1.0000 [drp] | Freq: Every day | OPHTHALMIC | 3 refills | Status: AC
  Filled 2024-05-05: qty 5, 50d supply, fill #0

## 2024-05-09 ENCOUNTER — Encounter (INDEPENDENT_AMBULATORY_CARE_PROVIDER_SITE_OTHER): Payer: Self-pay | Admitting: Ophthalmology

## 2024-05-09 ENCOUNTER — Other Ambulatory Visit: Payer: Self-pay

## 2024-05-09 MED FILL — Evolocumab Subcutaneous Soln Auto-Injector 140 MG/ML: SUBCUTANEOUS | 28 days supply | Qty: 2 | Fill #3 | Status: AC

## 2024-05-26 ENCOUNTER — Ambulatory Visit: Payer: Medicare PPO

## 2024-05-26 VITALS — BP 114/68 | Ht 61.0 in | Wt 190.0 lb

## 2024-05-26 DIAGNOSIS — Z0000 Encounter for general adult medical examination without abnormal findings: Secondary | ICD-10-CM

## 2024-05-26 NOTE — Progress Notes (Signed)
 I connected with  Natasha Franco on 05/26/24 by a audio enabled telemedicine application and verified that I am speaking with the correct person using two identifiers.  Patient Location: Home  Provider Location: Home Office  Persons Participating in Visit: Patient.  I discussed the limitations of evaluation and management by telemedicine. The patient expressed understanding and agreed to proceed.  Vital Signs: Because this visit was a virtual/telehealth visit, some criteria may be missing or patient reported. Any vitals not documented were not able to be obtained and vitals that have been documented are patient reported.   Because this visit was a virtual/telehealth visit,  certain criteria was not obtained, such a blood pressure, CBG if applicable, and timed get up and go. Any medications not marked as taking were not mentioned during the medication reconciliation part of the visit. Any vitals not documented were not able to be obtained due to this being a telehealth visit or patient was unable to self-report a recent blood pressure reading due to a lack of equipment at home via telehealth. Vitals that have been documented are verbally provided by the patient. \\   This visit was performed by a medical professional under my direct supervision. I was immediately available for consultation/collaboration. I have reviewed and agree with the Annual Wellness Visit documentation.  Chief Complaint  Patient presents with   Medicare Wellness     Subjective:   Natasha Franco is a 73 y.o. female who presents for a Medicare Annual Wellness Visit.  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   BLEPHAROPLASTY Bilateral    upper and lower   BREAST BIOPSY Left 2014   BREAST SURGERY Bilateral 1984   reduction mammoplasty   CARPAL TUNNEL RELEASE Right    CATARACT EXTRACTION     CESAREAN SECTION     x 2   FACIAL COSMETIC SURGERY     KNEE ARTHROSCOPY Left    REDUCTION MAMMAPLASTY Bilateral    ROTATOR CUFF REPAIR Right    TONSILLECTOMY     TOTAL KNEE ARTHROPLASTY Left 03/25/2019   Procedure: Left Knee Arthroplasty;  Surgeon: Sheril Coy, MD;  Location: [facility];  Service: Orthopedics;  Laterality: Left;   VAGINAL HYSTERECTOMY     Family History  Problem Relation Age of Onset   Arthritis Mother    Early death Father    Diabetes Father    Alcohol abuse Father    COPD Father    Heart attack Father    Mental illness Father    Alcohol abuse Sister    Stroke Maternal Grandmother    Stroke Maternal Grandfather    Diabetes Maternal Grandfather    Diabetes Paternal Grandfather    Anxiety disorder Daughter  Hypotension Daughter        POTS   Colon cancer Neg Hx    Liver cancer Neg Hx    Stomach cancer Neg Hx    Esophageal cancer Neg Hx    Rectal cancer Neg Hx    Breast cancer Neg Hx    Social History   Occupational History   Occupation: DOSHER  Tobacco Use   Smoking status: Former    Current packs/day: 0.00    Average packs/day: 1 pack/day for 25.0 years (25.0 ttl pk-yrs)    Types: Cigarettes    Start date: 09/01/1981    Quit date: 09/01/2006    Years since quitting: 17.7   Smokeless tobacco: Never  Vaping Use   Vaping status: Never Used  Substance and Sexual  Activity   Alcohol use: Yes    Comment: occ   Drug use: No   Sexual activity: Not Currently   Tobacco Counseling Counseling given: Not Answered  SDOH Screenings   Food Insecurity: No Food Insecurity (05/26/2024)  Housing: Low Risk  (05/26/2024)  Transportation Needs: No Transportation Needs (05/26/2024)  Utilities: Not At Risk (05/26/2024)  Alcohol Screen: Low Risk  (05/24/2023)  Depression (PHQ2-9): Low Risk  (05/26/2024)  Financial Resource Strain: Low Risk  (05/24/2023)  Physical Activity: Insufficiently Active (05/26/2024)  Social Connections: Socially Integrated (05/26/2024)  Stress: Stress Concern Present (05/26/2024)  Tobacco Use: Medium Risk (05/26/2024)  Health Literacy: Adequate Health Literacy (05/26/2024)   See flowsheets for full screening details  Depression Screen PHQ 2 & 9 Depression Scale- Over the past 2 weeks, how often have you been bothered by any of the following problems? Little interest or pleasure in doing things: 0 Feeling down, depressed, or hopeless (PHQ Adolescent also includes...irritable): 0 PHQ-2 Total Score: 0 Trouble falling or staying asleep, or sleeping too much: 0 Feeling tired or having little energy: 0 Poor appetite or overeating (PHQ Adolescent also includes...weight loss): 1 Feeling bad about yourself - or that you are a failure or have let yourself or your family down: 0 Trouble concentrating on things, such as reading the newspaper or watching television (PHQ Adolescent also includes...like school work): 0 Moving or speaking so slowly that other people could have noticed. Or the opposite - being so fidgety or restless that you have been moving around a lot more than usual: 0 Thoughts that you would be better off dead, or of hurting yourself in some way: 0 PHQ-9 Total Score: 1 If you checked off any problems, how difficult have these problems made it for you to do your work, take care of things at home, or get along with other people?:  Not difficult at all  Depression Treatment Depression Interventions/Treatment : EYV7-0 Score <4 Follow-up Not Indicated     Goals Addressed             This Visit's Progress    Patient Stated       To lose weight       Visit info / Clinical Intake: Medicare Wellness Visit Type:: Subsequent Annual Wellness Visit Persons participating in visit:: patient Medicare Wellness Visit Mode:: Telephone If telephone:: video declined Because this visit was a virtual/telehealth visit:: pt reported vitals If Telephone or Video please confirm:: I connected with the patient using audio enabled telemedicine application and verified that I am speaking with the correct person using two identifiers; I discussed the limitations of evaluation and management by telemedicine; The patient expressed understanding and agreed to proceed Patient Location:: home Provider Location:: home  offic e Information given by:: patient Interpreter Needed?: No Pre-visit prep was completed: yes AWV questionnaire completed by patient prior to visit?: no Living arrangements:: lives with spouse/significant other Patient's Overall Health Status Rating: very good Typical amount of pain: none Does pain affect daily life?: no Are you currently prescribed opioids?: no  Dietary Habits and Nutritional Risks How many meals a day?: 3 Eats fruit and vegetables daily?: yes Most meals are obtained by: preparing own meals; eating out In the last 2 weeks, have you had any of the following?: none Diabetic:: (!) yes Any non-healing wounds?: no How often do you check your BS?: continuous glucose monitor Would you like to be referred to a Nutritionist or for Diabetic Management? : no  Functional Status Activities of Daily Living (to include ambulation/medication): Independent Ambulation: Independent Medication Administration: Independent Home Management: Independent Manage your own finances?: yes Primary transportation is:  driving Concerns about vision?: no *vision screening is required for WTM* Concerns about hearing?: no  Fall Screening Falls in the past year?: 0 Number of falls in past year: 0 Was there an injury with Fall?: 0 Fall Risk Category Calculator: 0 Patient Fall Risk Level: Low Fall Risk  Fall Risk Patient at Risk for Falls Due to: No Fall Risks Fall risk Follow up: Falls evaluation completed; Falls prevention discussed  Home and Transportation Safety: All rugs have non-skid backing?: N/A, no rugs All stairs or steps have railings?: (!) no Grab bars in the bathtub or shower?: yes Have non-skid surface in bathtub or shower?: yes Good home lighting?: yes Regular seat belt use?: yes Hospital stays in the last year:: no  Cognitive Assessment Difficulty concentrating, remembering, or making decisions? : no Will 6CIT or Mini Cog be Completed: no 6CIT or Mini Cog Declined: patient alert, oriented, able to answer questions appropriately and recall recent events  Advance Directives (For Healthcare) Does Patient Have a Medical Advance Directive?: Yes Does patient want to make changes to medical advance directive?: No - Patient declined Type of Advance Directive: Healthcare Power of [place]; Living will Copy of Healthcare Power of Attorney in Chart?: No - copy requested Copy of Living Will in Chart?: No - copy requested  Reviewed/Updated  Reviewed/Updated: Reviewed All (Medical, Surgical, Family, Medications, Allergies, Care Teams, Patient Goals)        Objective:    Today's Vitals   05/26/24 0830  BP: 114/68  Weight: 190 lb (86.2 kg)  Height: 5' 1 (1.549 m)   Body mass index is 35.9 kg/m.  Current Medications (verified) Outpatient Encounter Medications as of 05/26/2024  Medication Sig   Accu-Chek Softclix Lancets lancets Use to check blood sugar daily. Dx E11.9   albuterol (VENTOLIN HFA) 108 (90 Base) MCG/ACT inhaler INHALE 1 TO 2 PUFFS BY MOUTH EVERY 4 TO 6 HOURS AS NEEDED  FOR COUGH OR WHEEZING   ALPRAZolam  (XANAX ) 0.5 MG tablet Take 1 tablet (0.5 mg total) by mouth 2 (two) times daily as needed.   azelastine  (ASTELIN ) 0.1 % nasal spray Place 2 sprays into both nostrils 2 (two) times daily. Use in each nostril as directed/PRN   Blood Glucose Monitoring Suppl (ACCU-CHEK GUIDE) w/Device KIT Use to check blood sugar daily. Dx E11.9   colchicine  0.6 MG tablet Take 1 tablet (0.6 mg total) by mouth daily as needed.   Continuous Glucose Receiver (DEXCOM G7 RECEIVER) DEVI 1 each by Does not apply route daily.   Continuous Glucose Sensor (DEXCOM G7 SENSOR) MISC 1 each by Does not apply route at  bedtime.   empagliflozin  (JARDIANCE ) 25 MG TABS tablet Take 1 tablet (25 mg total) by mouth daily.   escitalopram  (LEXAPRO ) 20 MG tablet Take 1 tablet (20 mg total) by mouth at bedtime.   estradiol  (ESTRACE  VAGINAL) 0.1 MG/GM vaginal cream Place 1 Applicatorful vaginally 3 (three) times a week.   Evolocumab  (REPATHA  SURECLICK) 140 MG/ML SOAJ Inject 140 mg into the skin every 14 (fourteen) days.   fluticasone  (FLONASE ) 50 MCG/ACT nasal spray Place 2 sprays into both nostrils 2 (two) times daily. PRN   fluticasone -salmeterol (WIXELA INHUB) 250-50 MCG/ACT AEPB Inhale 1 puff into the lungs in the morning and at bedtime. PRN   glucose blood test strip Use as instructed to check blood sugar daily. Dx E11.9   ibuprofen  (ADVIL ) 800 MG tablet Take 1 tablet (800 mg total) by mouth 3 (three) times daily with meals as needed.   insulin  glargine (LANTUS  SOLOSTAR) 100 UNIT/ML Solostar Pen Inject 26 Units into the skin daily.   Insulin  Pen Needle 30G X 5 MM MISC Use daily with insulin  injection   ipratropium (ATROVENT ) 0.06 % nasal spray Place 2 sprays into both nostrils 3 (three) times daily as needed for rhinitis.   ketorolac  (ACULAR ) 0.5 % ophthalmic solution Place 1 drop into the right eye daily.   levocetirizine (XYZAL ) 5 MG tablet Take 1 tablet (5 mg total) by mouth every evening.    levocetirizine (XYZAL ) 5 MG tablet Take 1 tablet (5 mg total) by mouth every evening.   metFORMIN  (GLUCOPHAGE ) 500 MG tablet Take 1 tablet (500 mg total) by mouth at bedtime.   montelukast  (SINGULAIR ) 10 MG tablet Take 1 tablet (10 mg total) by mouth at bedtime.   ondansetron  (ZOFRAN ) 4 MG tablet Take 1 tablet (4 mg total) by mouth every 8 (eight) hours as needed for nausea or vomiting.   pantoprazole  (PROTONIX ) 40 MG tablet Take 1 tablet (40 mg total) by mouth 2 (two) times daily 30 minutes before meals.   prednisoLONE  acetate (PRED FORTE ) 1 % ophthalmic suspension Place 1 drop into the right eye daily.   timolol  (TIMOPTIC ) 0.5 % ophthalmic solution Place 1 drop into the right eye 2 (two) times daily.   topiramate  (TOPAMAX ) 100 MG tablet Take 1 tablet (100 mg total) by mouth at bedtime.   traZODone  (DESYREL ) 50 MG tablet Take 1 tablet (50 mg total) by mouth at bedtime.   ursodiol  (ACTIGALL ) 300 MG capsule Take 2 capsules (600 mg total) by mouth 2 (two) times daily.   No facility-administered encounter medications on file as of 05/26/2024.   Hearing/Vision screen Hearing Screening - Comments:: No difficulties Vision Screening - Comments:: Patient wears readers Immunizations and Health Maintenance Health Maintenance  Topic Date Due   Hepatitis C Screening  Never done   Zoster Vaccines- Shingrix (2 of 2) 10/25/2020   Medicare Annual Wellness (AWV)  05/23/2024   Influenza Vaccine  10/07/2024 (Originally 02/08/2024)   HEMOGLOBIN A1C  08/10/2024   Diabetic kidney evaluation - Urine ACR  11/07/2024   FOOT EXAM  11/07/2024   OPHTHALMOLOGY EXAM  12/17/2024   Diabetic kidney evaluation - eGFR measurement  03/11/2025   Mammogram  06/05/2025   Colonoscopy  08/04/2029   DTaP/Tdap/Td (4 - Td or Tdap) 04/18/2032   Pneumococcal Vaccine: 50+ Years  Completed   DEXA SCAN  Completed   Meningococcal B Vaccine  Aged Out   Lung Cancer Screening  Discontinued   COVID-19 Vaccine  Discontinued         Assessment/Plan:  This is a routine wellness examination for Lynanne.  Patient Care Team: Cleatus Arlyss RAMAN, MD as PCP - General (Family Medicine) Loni Soyla LABOR, MD as Consulting Physician (Cardiology) Elisabeth Craig RAMAN, MD as Consulting Physician (Plastic Surgery) [name], MD as Consulting Physician (Ophthalmology)  I have personally reviewed and noted the following in the patient's chart:   Medical and social history Use of alcohol, tobacco or illicit drugs  Current medications and supplements including opioid prescriptions. Functional ability and status Nutritional status Physical activity Advanced directives List of other physicians Hospitalizations, surgeries, and ER visits in previous 12 months Vitals Screenings to include cognitive, depression, and falls Referrals and appointments  No orders of the defined types were placed in this encounter.  In addition, I have reviewed and discussed with patient certain preventive protocols, quality metrics, and best practice recommendations. A written personalized care plan for preventive services as well as general preventive health recommendations were provided to patient.   Lyle MARLA Right, [place]   05/26/2024   No follow-ups on file.  After Visit Summary: (MyChart) Due to this being a telephonic visit, the after visit summary with patients personalized plan was offered to patient via MyChart   Nurse Notes: nothing to report

## 2024-05-26 NOTE — Patient Instructions (Signed)
 [name],  Thank you for taking the time for your Medicare Wellness Visit. I appreciate your continued commitment to your health goals. Please review the care plan we discussed, and feel free to reach out if I can assist you further.  Please note that Annual Wellness Visits do not include a physical exam. Some assessments may be limited, especially if the visit was conducted virtually. If needed, we may recommend an in-person follow-up with your provider.  Ongoing Care Seeing your primary care provider every 3 to 6 months helps us  monitor your health and provide consistent, personalized care.   Referrals If a referral was made during today's visit and you haven't received any updates within two weeks, please contact the referred provider directly to check on the status.  Recommended Screenings:  Health Maintenance  Topic Date Due   Hepatitis C Screening  Never done   Zoster (Shingles) Vaccine (2 of 2) 10/25/2020   Medicare Annual Wellness Visit  05/23/2024   Flu Shot  10/07/2024*   Hemoglobin A1C  08/10/2024   Yearly kidney health urinalysis for diabetes  11/07/2024   Complete foot exam   11/07/2024   Eye exam for diabetics  12/17/2024   Yearly kidney function blood test for diabetes  03/11/2025   Breast Cancer Screening  06/05/2025   Colon Cancer Screening  08/04/2029   DTaP/Tdap/Td vaccine (4 - Td or Tdap) 04/18/2032   Pneumococcal Vaccine for age over 2  Completed   DEXA scan (bone density measurement)  Completed   Meningitis B Vaccine  Aged Out   Screening for Lung Cancer  Discontinued   COVID-19 Vaccine  Discontinued  *Topic was postponed. The date shown is not the original due date.       05/26/2024    8:33 AM  Advanced Directives  Does Patient Have a Medical Advance Directive? Yes  Type of Estate Agent of [place];Living will  Does patient want to make changes to medical advance directive? No - Patient declined  Copy of Healthcare Power of  Attorney in Chart? No - copy requested    Vision: Annual vision screenings are recommended for early detection of glaucoma, cataracts, and diabetic retinopathy. These exams can also reveal signs of chronic conditions such as diabetes and high blood pressure.  Dental: Annual dental screenings help detect early signs of oral cancer, gum disease, and other conditions linked to overall health, including heart disease and diabetes.  Please see the attached documents for additional preventive care recommendations.

## 2024-05-29 ENCOUNTER — Other Ambulatory Visit: Payer: Self-pay

## 2024-05-29 ENCOUNTER — Other Ambulatory Visit: Payer: Self-pay | Admitting: Family Medicine

## 2024-05-29 ENCOUNTER — Encounter: Payer: Self-pay | Admitting: Family Medicine

## 2024-05-29 ENCOUNTER — Encounter: Payer: Self-pay | Admitting: Gastroenterology

## 2024-05-30 ENCOUNTER — Other Ambulatory Visit: Payer: Self-pay

## 2024-06-01 MED FILL — Ursodiol Cap 300 MG: ORAL | 90 days supply | Qty: 360 | Fill #0 | Status: AC

## 2024-06-02 ENCOUNTER — Other Ambulatory Visit: Payer: Self-pay

## 2024-06-02 MED FILL — Evolocumab Subcutaneous Soln Auto-Injector 140 MG/ML: SUBCUTANEOUS | 28 days supply | Qty: 2 | Fill #4 | Status: AC

## 2024-06-04 ENCOUNTER — Encounter: Payer: Self-pay | Admitting: Family Medicine

## 2024-06-04 ENCOUNTER — Other Ambulatory Visit: Payer: Self-pay

## 2024-06-04 NOTE — Telephone Encounter (Signed)
 Rx sent 02/18/24, #2 mL/6 refills to [facility].  Spoke with [facility] pharmacy asking about Repatha  rx. Told they have filled rx and is ready for pt to pick up and that there are 2 refills left on current rx.  Spoke with pt relaying info from pharmacy. Pt verbalizes understanding and expresses her thanks for checking on rx.

## 2024-06-09 ENCOUNTER — Other Ambulatory Visit

## 2024-06-09 ENCOUNTER — Other Ambulatory Visit: Payer: Self-pay | Admitting: Family Medicine

## 2024-06-09 ENCOUNTER — Other Ambulatory Visit: Payer: Self-pay

## 2024-06-09 DIAGNOSIS — E119 Type 2 diabetes mellitus without complications: Secondary | ICD-10-CM | POA: Diagnosis not present

## 2024-06-09 LAB — CBC WITH DIFFERENTIAL/PLATELET
Basophils Absolute: 0 K/uL (ref 0.0–0.1)
Basophils Relative: 0.6 % (ref 0.0–3.0)
Eosinophils Absolute: 0.2 K/uL (ref 0.0–0.7)
Eosinophils Relative: 2.9 % (ref 0.0–5.0)
HCT: 42 % (ref 36.0–46.0)
Hemoglobin: 13.9 g/dL (ref 12.0–15.0)
Lymphocytes Relative: 28.5 % (ref 12.0–46.0)
Lymphs Abs: 2 K/uL (ref 0.7–4.0)
MCHC: 33.2 g/dL (ref 30.0–36.0)
MCV: 89.4 fl (ref 78.0–100.0)
Monocytes Absolute: 0.6 K/uL (ref 0.1–1.0)
Monocytes Relative: 9 % (ref 3.0–12.0)
Neutro Abs: 4.2 K/uL (ref 1.4–7.7)
Neutrophils Relative %: 59 % (ref 43.0–77.0)
Platelets: 287 K/uL (ref 150.0–400.0)
RBC: 4.69 Mil/uL (ref 3.87–5.11)
RDW: 14.2 % (ref 11.5–15.5)
WBC: 7.1 K/uL (ref 4.0–10.5)

## 2024-06-09 LAB — LIPID PANEL
Cholesterol: 122 mg/dL (ref 0–200)
HDL: 44.8 mg/dL (ref >39.00)
LDL Cholesterol: 59 mg/dL (ref 0–99)
NonHDL: 77.26
Total CHOL/HDL Ratio: 3
Triglycerides: 91 mg/dL (ref 0.0–149.0)
VLDL: 18.2 mg/dL (ref 0.0–40.0)

## 2024-06-09 LAB — COMPREHENSIVE METABOLIC PANEL WITH GFR
ALT: 24 U/L (ref 0–35)
AST: 20 U/L (ref 0–37)
Albumin: 4 g/dL (ref 3.5–5.2)
Alkaline Phosphatase: 73 U/L (ref 39–117)
BUN: 18 mg/dL (ref 6–23)
CO2: 25 meq/L (ref 19–32)
Calcium: 8.9 mg/dL (ref 8.4–10.5)
Chloride: 107 meq/L (ref 96–112)
Creatinine, Ser: 0.68 mg/dL (ref 0.40–1.20)
GFR: 87.3 mL/min (ref >60.00)
Glucose, Bld: 156 mg/dL — ABNORMAL HIGH (ref 70–99)
Potassium: 4.3 meq/L (ref 3.5–5.1)
Sodium: 140 meq/L (ref 135–145)
Total Bilirubin: 0.6 mg/dL (ref 0.2–1.2)
Total Protein: 6.6 g/dL (ref 6.0–8.3)

## 2024-06-09 LAB — TSH: TSH: 1.54 u[IU]/mL (ref 0.35–5.50)

## 2024-06-09 LAB — LIPASE: Lipase: 33 U/L (ref 11.0–59.0)

## 2024-06-09 LAB — URIC ACID: Uric Acid, Serum: 4.4 mg/dL (ref 2.4–7.0)

## 2024-06-10 ENCOUNTER — Ambulatory Visit: Payer: Self-pay | Admitting: Family Medicine

## 2024-06-10 ENCOUNTER — Other Ambulatory Visit: Payer: Self-pay | Admitting: Family Medicine

## 2024-06-10 ENCOUNTER — Other Ambulatory Visit: Payer: Self-pay

## 2024-06-10 MED FILL — Montelukast Sodium Tab 10 MG (Base Equiv): ORAL | 90 days supply | Qty: 90 | Fill #0 | Status: AC

## 2024-06-10 MED FILL — Pantoprazole Sodium EC Tab 40 MG (Base Equiv): ORAL | 90 days supply | Qty: 180 | Fill #0 | Status: AC

## 2024-06-12 ENCOUNTER — Encounter: Admitting: Family Medicine

## 2024-06-16 ENCOUNTER — Ambulatory Visit: Payer: Self-pay | Admitting: Family Medicine

## 2024-06-16 ENCOUNTER — Ambulatory Visit: Admitting: Family Medicine

## 2024-06-16 ENCOUNTER — Other Ambulatory Visit: Payer: Self-pay

## 2024-06-16 ENCOUNTER — Encounter: Payer: Self-pay | Admitting: Family Medicine

## 2024-06-16 VITALS — BP 122/60 | HR 78 | Temp 97.9°F | Ht 61.25 in | Wt 194.0 lb

## 2024-06-16 DIAGNOSIS — Z23 Encounter for immunization: Secondary | ICD-10-CM

## 2024-06-16 DIAGNOSIS — E119 Type 2 diabetes mellitus without complications: Secondary | ICD-10-CM

## 2024-06-16 LAB — POCT GLYCOSYLATED HEMOGLOBIN (HGB A1C): Hemoglobin A1C: 7.2 % — AB (ref 4.0–5.6)

## 2024-06-16 MED ORDER — TOPIRAMATE 100 MG PO TABS
100.0000 mg | ORAL_TABLET | Freq: Every day | ORAL | 3 refills | Status: AC
  Filled 2024-06-16: qty 90, 90d supply, fill #0

## 2024-06-16 MED ORDER — EMPAGLIFLOZIN 25 MG PO TABS: 25.0000 mg | ORAL_TABLET | ORAL | Status: DC

## 2024-06-16 MED ORDER — LANTUS SOLOSTAR 100 UNIT/ML ~~LOC~~ SOPN: 34.0000 [IU] | PEN_INJECTOR | Freq: Every day | SUBCUTANEOUS | Status: DC

## 2024-06-16 MED ORDER — ESCITALOPRAM OXALATE 20 MG PO TABS
20.0000 mg | ORAL_TABLET | Freq: Every day | ORAL | 3 refills | Status: AC
  Filled 2024-06-16: qty 90, 90d supply, fill #0

## 2024-06-16 MED ORDER — TRAZODONE HCL 50 MG PO TABS
50.0000 mg | ORAL_TABLET | Freq: Every day | ORAL | 3 refills | Status: AC
  Filled 2024-06-16: qty 90, 90d supply, fill #0

## 2024-06-16 NOTE — Progress Notes (Unsigned)
 Elevated Cholesterol: Using medications without problems: yes Muscle aches: not with repatha .   Diet compliance: d/w pt.  Exercise: d/w pt.    No CP.    Mood d/w pt. Still on lexapro  with xanax  prn.  Safe at home.  No SI/HI.    No recent colchicine  use.   Diabetes:  Using medications without difficulties: see below re: jardiance .   Hypoglycemic episodes:no Hyperglycemic episodes:no Feet problems:no Blood Sugars averaging: usually ~160 eye exam within last year: yes Labs d/w pt.  A1c 7.2.    H/o Barrett's on PPI.  GI f/u pending.   Insomnia.  Taking trazodone  at baseline.  That helped.    Still taking ursodiol  at baseline.  LFTs normal.    D/w pt about vaginal estrogen. H/o bladder tack and hysterectomy.  H/o yeast infections.  D/w pt about trial of jardiance  every other day.    Husband designated patient were incapacitated. Tetanus shot 2023 First Shingrix shot previously done. Pneumonia vaccine up-to-date. COVID shot previously done. Flu shot yearly. Colonoscopy 2021 Pap not indicated. DEXA 2021 Mammogram pending 2025.   Migraines clearly improved on topamax .  No ADE on med.   D/w pt about trying OTC voltaren gel for hand/finger OA.   PMH and SH reviewed  Meds, vitals, and allergies reviewed.   ROS: Per HPI unless specifically indicated in ROS section   GEN: nad, alert and oriented HEENT: mucous membranes moist R>L pupil at baseline.  NECK: supple w/o LA CV: rrr. PULM: ctab, no inc wob ABD: soft, +bs EXT: no edema SKIN: no acute rash She has PIP and DIP OA changes B.

## 2024-06-16 NOTE — Patient Instructions (Addendum)
 Try taking jardiance  every other and see if you have less trouble with yeast infections.   Recheck A1c at a visit in about 4 months.  Update me as needed in the meantime.  Call about a mammogram when possible.   You could try OTC voltaren gel for hand/finger pain.  Take care.  Glad to see you.

## 2024-06-18 DIAGNOSIS — G4700 Insomnia, unspecified: Secondary | ICD-10-CM | POA: Insufficient documentation

## 2024-06-18 DIAGNOSIS — G43909 Migraine, unspecified, not intractable, without status migrainosus: Secondary | ICD-10-CM | POA: Insufficient documentation

## 2024-06-18 DIAGNOSIS — E785 Hyperlipidemia, unspecified: Secondary | ICD-10-CM | POA: Insufficient documentation

## 2024-06-18 NOTE — Assessment & Plan Note (Signed)
Husband designated patient were incapacitated. 

## 2024-06-18 NOTE — Assessment & Plan Note (Signed)
 Continue trazodone as is.

## 2024-06-18 NOTE — Assessment & Plan Note (Signed)
 History of, clearly improved with Topamax .  Would continue as is.

## 2024-06-18 NOTE — Assessment & Plan Note (Signed)
 Still on lexapro  with xanax  prn.  Safe at home.  No SI/HI.   That combination medication helped.  Would continue as is.

## 2024-06-18 NOTE — Assessment & Plan Note (Signed)
 Labs d/w pt.  A1c 7.2.   She could try taking jardiance  every other and see if she has less trouble with yeast infections.   Recheck A1c at a visit in about 4 months.  Update me as needed in the meantime.  Discussed that she may need to adjust her insulin  dose upward gradually with alternating Jardiance .  She can update me as needed.

## 2024-06-18 NOTE — Assessment & Plan Note (Signed)
Continue Repatha.  Continue work on diet and exercise.

## 2024-06-18 NOTE — Assessment & Plan Note (Signed)
 Statin intolerant with history of LFT elevation.

## 2024-06-18 NOTE — Assessment & Plan Note (Signed)
 R>L pupil at baseline.  I asked her to check with the eye clinic about this.

## 2024-06-18 NOTE — Assessment & Plan Note (Signed)
 Still taking ursodiol  at baseline.  LFTs normal.   Continue as is.

## 2024-06-18 NOTE — Assessment & Plan Note (Signed)
 Husband designated patient were incapacitated. Tetanus shot 2023 First Shingrix shot previously done. Pneumonia vaccine up-to-date. COVID shot previously done. Flu shot yearly. Colonoscopy 2021 Pap not indicated. DEXA 2021 Mammogram pending 2025.

## 2024-06-18 NOTE — Assessment & Plan Note (Signed)
 Continue PPI.  Swallowing well.

## 2024-06-18 NOTE — Assessment & Plan Note (Signed)
 D/w pt about trying OTC voltaren gel for hand/finger OA.

## 2024-07-07 ENCOUNTER — Other Ambulatory Visit: Payer: Self-pay | Admitting: Family Medicine

## 2024-07-07 ENCOUNTER — Other Ambulatory Visit: Payer: Self-pay

## 2024-07-07 ENCOUNTER — Other Ambulatory Visit (HOSPITAL_COMMUNITY): Payer: Self-pay

## 2024-07-07 ENCOUNTER — Ambulatory Visit
Admission: RE | Admit: 2024-07-07 | Discharge: 2024-07-07 | Disposition: A | Source: Ambulatory Visit | Attending: Family Medicine | Admitting: Family Medicine

## 2024-07-07 DIAGNOSIS — Z1231 Encounter for screening mammogram for malignant neoplasm of breast: Secondary | ICD-10-CM

## 2024-07-07 MED ORDER — EMBECTA AUTOSHIELD DUO 30G X 5 MM MISC
1 refills | Status: AC
  Filled 2024-07-07: qty 100, 90d supply, fill #0

## 2024-07-07 MED FILL — Evolocumab Subcutaneous Soln Auto-Injector 140 MG/ML: SUBCUTANEOUS | 28 days supply | Qty: 2 | Fill #5 | Status: AC

## 2024-07-08 ENCOUNTER — Other Ambulatory Visit: Payer: Self-pay

## 2024-07-13 ENCOUNTER — Ambulatory Visit: Payer: Self-pay | Admitting: Family Medicine

## 2024-07-14 ENCOUNTER — Encounter

## 2024-07-14 ENCOUNTER — Other Ambulatory Visit: Payer: Self-pay

## 2024-07-14 DIAGNOSIS — K2270 Barrett's esophagus without dysplasia: Secondary | ICD-10-CM

## 2024-07-14 DIAGNOSIS — Z79899 Other long term (current) drug therapy: Secondary | ICD-10-CM

## 2024-07-14 DIAGNOSIS — K219 Gastro-esophageal reflux disease without esophagitis: Secondary | ICD-10-CM

## 2024-07-14 NOTE — Progress Notes (Signed)
 PCP MD at time of PV: Cleatus, MD  __________________________________________________________________________________________________________________________________________  No egg allergy known to patient  No soy allergy known to patient No issues known to pt with past sedation with any surgeries or procedures Patient denies ever being told they had issues or difficulty with intubation  No FH of Malignant Hyperthermia Pt is not on diet pills Pt is not on  home 02  Pt is not on blood thinners  No A fib or A flutter Have any cardiac testing pending-- no  LOA: independent  No Chew or Snuff tobacco __________________________________________________________________________________________________________________________________________  PV completed with patient. Prep instructions reviewed and provided during apt. Rx sent to preferred pharmacy.  __________________________________________________________________________________________________________________________________________  Patient's chart reviewed by Norleen Schillings CNRA prior to previsit and patient appropriate for the [facility].  Previsit completed and red dot placed by patient's name on their procedure day (on provider's schedule).

## 2024-07-15 ENCOUNTER — Encounter: Payer: Self-pay | Admitting: Gastroenterology

## 2024-07-15 MED FILL — Ibuprofen Tab 800 MG: ORAL | 30 days supply | Qty: 90 | Fill #1 | Status: AC

## 2024-07-17 ENCOUNTER — Other Ambulatory Visit: Payer: Self-pay | Admitting: Family Medicine

## 2024-07-17 ENCOUNTER — Other Ambulatory Visit: Payer: Self-pay

## 2024-07-18 ENCOUNTER — Other Ambulatory Visit: Payer: Self-pay

## 2024-07-18 MED ORDER — EMPAGLIFLOZIN 25 MG PO TABS
25.0000 mg | ORAL_TABLET | ORAL | 0 refills | Status: AC
  Filled 2024-07-18: qty 45, 90d supply, fill #0

## 2024-07-18 MED FILL — Insulin Glargine Soln Pen-Injector 100 Unit/ML: SUBCUTANEOUS | 57 days supply | Qty: 15 | Fill #0 | Status: AC

## 2024-07-21 ENCOUNTER — Other Ambulatory Visit: Payer: Self-pay

## 2024-07-23 ENCOUNTER — Encounter: Payer: Self-pay | Admitting: Gastroenterology

## 2024-07-23 ENCOUNTER — Ambulatory Visit: Admitting: Gastroenterology

## 2024-07-23 VITALS — BP 119/76 | HR 77 | Temp 97.3°F | Resp 17 | Ht 61.0 in | Wt 190.0 lb

## 2024-07-23 DIAGNOSIS — K449 Diaphragmatic hernia without obstruction or gangrene: Secondary | ICD-10-CM | POA: Diagnosis not present

## 2024-07-23 DIAGNOSIS — K219 Gastro-esophageal reflux disease without esophagitis: Secondary | ICD-10-CM

## 2024-07-23 DIAGNOSIS — K2270 Barrett's esophagus without dysplasia: Secondary | ICD-10-CM

## 2024-07-23 MED ORDER — SODIUM CHLORIDE 0.9 % IV SOLN: 500.0000 mL | Freq: Once | INTRAVENOUS | Status: DC

## 2024-07-23 NOTE — Patient Instructions (Signed)

## 2024-07-23 NOTE — Progress Notes (Signed)
 VS by EJ  Pt's states no medical or surgical changes since previsit or office visit.

## 2024-07-23 NOTE — Progress Notes (Signed)
 [facility] History and Physical   Primary Care Physician:  Cleatus Arlyss RAMAN, MD   Reason for Procedure:   Barrett's esophagus  Plan:    EGD with biopsies     HPI: Natasha Franco is a 74 y.o. female  here for EGD to survey Barrett's. Short segment of BE dx 07/2019. On protonix  40mg  BID to control reflux symptoms, has been on this for a while. Routine surveillance EGD today to reassess BE.   Otherwise feels well without any cardiopulmonary symptoms.   I have discussed risks / benefits of anesthesia and endoscopic procedure with Natasha Franco and they wish to proceed with the exams as outlined today.   The patient was provided an opportunity to ask questions and all were answered. The patient agreed with the plan.    Past Medical History:  Diagnosis Date   Allergy    Anxiety    Arthritis    Barrett's esophagus    Cataract 07/12/2020   Bilateral cateract removal w/ IOL 03&04/23   Chicken pox    Complication of anesthesia    Diabetes mellitus without complication (HCC)    Diverticulosis    GERD (gastroesophageal reflux disease)    Hay fever    Hepatitis    Hyperlipidemia    Per pt   Incontinent of urine    Migraines    Primary biliary cirrhosis (HCC)    suspected (AMA (+) but biopsy negative 2014)    Past Surgical History:  Procedure Laterality Date   BLADDER SUSPENSION     with mesh erosion in vaginal vault.   BLEPHAROPLASTY Bilateral    upper and lower   BREAST BIOPSY Left 2014   BREAST SURGERY Bilateral 1984   reduction mammoplasty   CARPAL TUNNEL RELEASE Right    CATARACT EXTRACTION     CESAREAN SECTION     x 2   EYE SURGERY  2023   Cataracts   FACIAL COSMETIC SURGERY     JOINT REPLACEMENT     KNEE ARTHROSCOPY Left    REDUCTION MAMMAPLASTY Bilateral    ROTATOR CUFF REPAIR Right    TONSILLECTOMY     TOTAL KNEE ARTHROPLASTY Left 03/25/2019   Procedure: Left Knee Arthroplasty;  Surgeon: Sheril Coy, MD;  Location: [facility];  Service:  Orthopedics;  Laterality: Left;   TUBAL LIGATION     VAGINAL HYSTERECTOMY      Prior to Admission medications  Medication Sig Start Date End Date Taking? Authorizing Provider  azelastine  (ASTELIN ) 0.1 % nasal spray Place 2 sprays into both nostrils 2 (two) times daily. Use in each nostril as directed/PRN   Yes [provider]  Continuous Glucose Receiver (DEXCOM G7 RECEIVER) DEVI 1 each by Does not apply route daily. 02/11/24  Yes Cleatus Arlyss RAMAN, MD  Continuous Glucose Sensor (DEXCOM G7 SENSOR) MISC 1 each by Does not apply route at bedtime. 02/11/24  Yes Cleatus Arlyss RAMAN, MD  empagliflozin  (JARDIANCE ) 25 MG TABS tablet Take 1 tablet (25 mg total) by mouth every other day. 07/18/24  Yes Cleatus Arlyss RAMAN, MD  escitalopram  (LEXAPRO ) 20 MG tablet Take 1 tablet (20 mg total) by mouth at bedtime. 06/16/24  Yes Cleatus Arlyss RAMAN, MD  fluticasone  (FLONASE ) 50 MCG/ACT nasal spray Place 2 sprays into both nostrils 2 (two) times daily. PRN   Yes [provider]  ibuprofen  (ADVIL ) 800 MG tablet Take 1 tablet (800 mg total) by mouth 3 (three) times daily with meals as needed. 02/18/24  Yes Cleatus,  Arlyss RAMAN, MD  insulin  glargine (LANTUS  SOLOSTAR) 100 UNIT/ML Solostar Pen Inject 26 Units into the skin daily. 07/18/24  Yes Cleatus Arlyss RAMAN, MD  Insulin  Pen Needle [facility] AUTOSHIELD DUO) 30G X 5 MM MISC Use daily with insulin  injection 07/07/24  Yes Cleatus Arlyss RAMAN, MD  ipratropium (ATROVENT ) 0.06 % nasal spray Place 2 sprays into both nostrils 3 (three) times daily as needed for rhinitis. 02/08/24  Yes Cleatus Arlyss RAMAN, MD  ketorolac  (ACULAR ) 0.5 % ophthalmic solution Place 1 drop into the right eye daily. 05/05/24 05/05/25 Yes [name], MD  levocetirizine (XYZAL ) 5 MG tablet Take 1 tablet (5 mg total) by mouth every evening. 04/11/24  Yes   metFORMIN  (GLUCOPHAGE ) 500 MG tablet Take 1 tablet (500 mg total) by mouth at bedtime. 04/14/24  Yes Cleatus Arlyss RAMAN, MD  montelukast  (SINGULAIR ) 10 MG tablet  Take 1 tablet (10 mg total) by mouth at bedtime. 06/10/24  Yes Cleatus Arlyss RAMAN, MD  pantoprazole  (PROTONIX ) 40 MG tablet Take 1 tablet (40 mg total) by mouth 2 (two) times daily 30 minutes before meals. 06/10/24  Yes Cleatus Arlyss RAMAN, MD  prednisoLONE  acetate (PRED FORTE ) 1 % ophthalmic suspension Place 1 drop into the right eye daily. 05/05/24  Yes [name], MD  timolol  (TIMOPTIC ) 0.5 % ophthalmic solution Place 1 drop into the right eye 2 (two) times daily. 12/18/23  Yes   topiramate  (TOPAMAX ) 100 MG tablet Take 1 tablet (100 mg total) by mouth at bedtime. 06/16/24  Yes Cleatus Arlyss RAMAN, MD  traZODone  (DESYREL ) 50 MG tablet Take 1 tablet (50 mg total) by mouth at bedtime. 06/16/24  Yes Cleatus Arlyss RAMAN, MD  ursodiol  (ACTIGALL ) 300 MG capsule Take 2 capsules (600 mg total) by mouth 2 (two) times daily. 06/01/24  Yes Cleatus Arlyss RAMAN, MD  Accu-Chek Softclix Lancets lancets Use to check blood sugar daily. Dx E11.9 03/29/23   Cleatus Arlyss RAMAN, MD  albuterol (VENTOLIN HFA) 108 (90 Base) MCG/ACT inhaler INHALE 1 TO 2 PUFFS BY MOUTH EVERY 4 TO 6 HOURS AS NEEDED FOR COUGH OR WHEEZING    [provider]  ALPRAZolam  (XANAX ) 0.5 MG tablet Take 1 tablet (0.5 mg total) by mouth 2 (two) times daily as needed. 03/11/24   Cleatus Arlyss RAMAN, MD  estradiol  (ESTRACE  VAGINAL) 0.1 MG/GM vaginal cream Place 1 Applicatorful vaginally 3 (three) times a week. Patient not taking: No sig reported 03/30/23   Cleatus Arlyss RAMAN, MD  Evolocumab  (REPATHA  SURECLICK) 140 MG/ML SOAJ Inject 140 mg into the skin every 14 (fourteen) days. 02/18/24   Cleatus Arlyss RAMAN, MD  fluticasone -salmeterol (WIXELA INHUB) 250-50 MCG/ACT AEPB Inhale 1 puff into the lungs in the morning and at bedtime. PRN Patient taking differently: Inhale 1 puff into the lungs 2 (two) times daily as needed. PRN    [provider]  glucose blood test strip Use as instructed to check blood sugar daily. Dx E11.9 03/29/23   Cleatus Arlyss RAMAN, MD  ondansetron   (ZOFRAN ) 4 MG tablet Take 1 tablet (4 mg total) by mouth every 8 (eight) hours as needed for nausea or vomiting. 03/30/23   Cleatus Arlyss RAMAN, MD    Current Outpatient Medications  Medication Sig Dispense Refill   azelastine  (ASTELIN ) 0.1 % nasal spray Place 2 sprays into both nostrils 2 (two) times daily. Use in each nostril as directed/PRN     Continuous Glucose Receiver (DEXCOM G7 RECEIVER) DEVI 1 each by Does not apply route daily. 1 each 0  Continuous Glucose Sensor (DEXCOM G7 SENSOR) MISC 1 each by Does not apply route at bedtime. 6 each 4   empagliflozin  (JARDIANCE ) 25 MG TABS tablet Take 1 tablet (25 mg total) by mouth every other day. 90 tablet 0   escitalopram  (LEXAPRO ) 20 MG tablet Take 1 tablet (20 mg total) by mouth at bedtime. 90 tablet 3   fluticasone  (FLONASE ) 50 MCG/ACT nasal spray Place 2 sprays into both nostrils 2 (two) times daily. PRN     ibuprofen  (ADVIL ) 800 MG tablet Take 1 tablet (800 mg total) by mouth 3 (three) times daily with meals as needed. 90 tablet 1   insulin  glargine (LANTUS  SOLOSTAR) 100 UNIT/ML Solostar Pen Inject 26 Units into the skin daily. 15 mL 3   Insulin  Pen Needle (EMBECTA AUTOSHIELD DUO) 30G X 5 MM MISC Use daily with insulin  injection 100 each 1   ipratropium (ATROVENT ) 0.06 % nasal spray Place 2 sprays into both nostrils 3 (three) times daily as needed for rhinitis.     ketorolac  (ACULAR ) 0.5 % ophthalmic solution Place 1 drop into the right eye daily. 5 mL 3   levocetirizine (XYZAL ) 5 MG tablet Take 1 tablet (5 mg total) by mouth every evening. 30 tablet 6   metFORMIN  (GLUCOPHAGE ) 500 MG tablet Take 1 tablet (500 mg total) by mouth at bedtime. 90 tablet 3   montelukast  (SINGULAIR ) 10 MG tablet Take 1 tablet (10 mg total) by mouth at bedtime. 90 tablet 1   pantoprazole  (PROTONIX ) 40 MG tablet Take 1 tablet (40 mg total) by mouth 2 (two) times daily 30 minutes before meals. 180 tablet 0   prednisoLONE  acetate (PRED FORTE ) 1 % ophthalmic suspension  Place 1 drop into the right eye daily. 15 mL 3   timolol  (TIMOPTIC ) 0.5 % ophthalmic solution Place 1 drop into the right eye 2 (two) times daily. 10 mL 4   topiramate  (TOPAMAX ) 100 MG tablet Take 1 tablet (100 mg total) by mouth at bedtime. 90 tablet 3   traZODone  (DESYREL ) 50 MG tablet Take 1 tablet (50 mg total) by mouth at bedtime. 90 tablet 3   ursodiol  (ACTIGALL ) 300 MG capsule Take 2 capsules (600 mg total) by mouth 2 (two) times daily. 360 capsule 1   Accu-Chek Softclix Lancets lancets Use to check blood sugar daily. Dx E11.9 100 each 12   albuterol (VENTOLIN HFA) 108 (90 Base) MCG/ACT inhaler INHALE 1 TO 2 PUFFS BY MOUTH EVERY 4 TO 6 HOURS AS NEEDED FOR COUGH OR WHEEZING     ALPRAZolam  (XANAX ) 0.5 MG tablet Take 1 tablet (0.5 mg total) by mouth 2 (two) times daily as needed. 60 tablet 1   estradiol  (ESTRACE  VAGINAL) 0.1 MG/GM vaginal cream Place 1 Applicatorful vaginally 3 (three) times a week. (Patient not taking: No sig reported) 42.5 g 1   Evolocumab  (REPATHA  SURECLICK) 140 MG/ML SOAJ Inject 140 mg into the skin every 14 (fourteen) days. 2 mL 6   fluticasone -salmeterol (WIXELA INHUB) 250-50 MCG/ACT AEPB Inhale 1 puff into the lungs in the morning and at bedtime. PRN (Patient taking differently: Inhale 1 puff into the lungs 2 (two) times daily as needed. PRN)     glucose blood test strip Use as instructed to check blood sugar daily. Dx E11.9 100 each 12   ondansetron  (ZOFRAN ) 4 MG tablet Take 1 tablet (4 mg total) by mouth every 8 (eight) hours as needed for nausea or vomiting. 20 tablet 0   Current Facility-Administered Medications  Medication Dose Route Frequency Provider  Last Rate Last Admin   0.9 %  sodium chloride  infusion  500 mL Intravenous Once Leigh Elspeth SQUIBB, MD        Allergies as of 07/23/2024 - Review Complete 07/23/2024  Allergen Reaction Noted   Bupropion hcl Rash 01/17/2010   Invokana [canagliflozin] Other (See Comments) 08/01/2018   Metformin  and related Other  (See Comments) 09/05/2022   Ozempic  (0.25 or 0.5 mg-dose) [semaglutide (0.25 or 0.5mg -dos)] Other (See Comments) 12/26/2022   Statins Other (See Comments) 09/01/2016   Trulicity  [dulaglutide ] Other (See Comments) 03/12/2024   Versed  [midazolam ] Other (See Comments) 03/12/2019   Zetia [ezetimibe] Cough 08/01/2018    Family History  Problem Relation Age of Onset   Arthritis Mother    Early death Father    Diabetes Father    Alcohol abuse Father    COPD Father    Heart attack Father    Mental illness Father    Alcohol abuse Sister    Stroke Maternal Grandmother    Stroke Maternal Grandfather    Diabetes Maternal Grandfather    Diabetes Paternal Grandfather    Anxiety disorder Daughter    Hypotension Daughter        POTS   Colon cancer Neg Hx    Liver cancer Neg Hx    Stomach cancer Neg Hx    Esophageal cancer Neg Hx    Rectal cancer Neg Hx    Breast cancer Neg Hx     Social History   Socioeconomic History   Marital status: Married    Spouse name: Dale   Number of children: 2   Years of education: Not on file   Highest education level: Associate degree: occupational, scientist, product/process development, or vocational program  Occupational History   Occupation: DOSHER  Tobacco Use   Smoking status: Former    Current packs/day: 0.00    Average packs/day: 1 pack/day for 25.0 years (25.0 ttl pk-yrs)    Types: Cigarettes    Start date: 09/01/1981    Quit date: 09/01/2006    Years since quitting: 17.9   Smokeless tobacco: Never  Vaping Use   Vaping status: Never Used  Substance and Sexual Activity   Alcohol use: Yes    Comment: occ   Drug use: No   Sexual activity: Not Currently  Other Topics Concern   Not on file  Social History Narrative   Married 1979.   2 daughters.   4 grandkids.   Retired [employer]   From [employer] .   Social Drivers of Health   Tobacco Use: Medium Risk (07/23/2024)   Patient History    Smoking Tobacco Use: Former    Smokeless Tobacco Use: Never     Passive Exposure: Not on file  Financial Resource Strain: Low Risk (05/24/2023)   Overall Financial Resource Strain (CARDIA)    Difficulty of Paying Living Expenses: Not hard at all  Food Insecurity: No Food Insecurity (05/26/2024)   Epic    Worried About Programme Researcher, Broadcasting/film/video in the Last Year: Never true    Ran Out of Food in the Last Year: Never true  Transportation Needs: No Transportation Needs (05/26/2024)   Epic    Lack of Transportation (Medical): No    Lack of Transportation (Non-Medical): No  Physical Activity: Insufficiently Active (05/26/2024)   Exercise Vital Sign    Days of Exercise per Week: 4 days    Minutes of Exercise per Session: 20 min  Stress: Stress Concern Present (05/26/2024)   [employer] of Occupational Health -  Occupational Stress Questionnaire    Feeling of Stress: To some extent  Social Connections: Socially Integrated (05/26/2024)   Social Connection and Isolation Panel    Frequency of Communication with Friends and Family: More than three times a week    Frequency of Social Gatherings with Friends and Family: Once a week    Attends Religious Services: More than 4 times per year    Active Member of Clubs or Organizations: Yes    Attends Banker Meetings: More than 4 times per year    Marital Status: Married  Catering Manager Violence: Not At Risk (05/26/2024)   Epic    Fear of Current or Ex-Partner: No    Emotionally Abused: No    Physically Abused: No    Sexually Abused: No  Depression (PHQ2-9): Medium Risk (06/16/2024)   Depression (PHQ2-9)    PHQ-2 Score: 5  Alcohol Screen: Low Risk (05/24/2023)   Alcohol Screen    Last Alcohol Screening Score (AUDIT): 4  Housing: Low Risk (05/26/2024)   Epic    Unable to Pay for Housing in the Last Year: No    Number of Times Moved in the Last Year: 0    Homeless in the Last Year: No  Utilities: Not At Risk (05/26/2024)   Epic    Threatened with loss of utilities: No  Health Literacy:  Adequate Health Literacy (05/26/2024)   B1300 Health Literacy    Frequency of need for help with medical instructions: Never    Review of Systems: All other review of systems negative except as mentioned in the HPI.  Physical Exam: Vital signs BP (!) 128/56   Pulse 66   Temp (!) 97.3 F (36.3 C) (Skin)   Ht 5' 1 (1.549 m)   Wt 190 lb (86.2 kg)   SpO2 94%   BMI 35.90 kg/m   General:   Alert,  Well-developed, pleasant and cooperative in NAD Lungs:  Clear throughout to auscultation.   Heart:  Regular rate and rhythm Abdomen:  Soft, nontender and nondistended.   Neuro/Psych:  Alert and cooperative. Normal mood and affect. A and O x 3  Marcey Naval, MD [facility]

## 2024-07-23 NOTE — Progress Notes (Signed)
 Called to room to assist during endoscopic procedure.  Patient ID and intended procedure confirmed with present staff. Received instructions for my participation in the procedure from the performing physician.

## 2024-07-23 NOTE — Progress Notes (Signed)
 Report given to PACU, vss

## 2024-07-23 NOTE — Op Note (Signed)
 [facility] Patient Name: Natasha Franco Procedure Date: 07/23/2024 9:55 AM MRN: [id number] Endoscopist: Elspeth P. Leigh , MD, [id number] Age: 74 Referring MD:  Date of Birth: 1951-01-19 Gender: Female Account #: [id number] Procedure:                Upper GI endoscopy Indications:              Surveillance of Barrett's esophagus - short                            segment, last exam 07/2019, GERD controlled on                            protonix . Medicines:                Monitored Anesthesia Care Procedure:                Pre-Anesthesia Assessment:                           - Prior to the procedure, a History and Physical                            was performed, and patient medications and                            allergies were reviewed. The patient's tolerance of                            previous anesthesia was also reviewed. The risks                            and benefits of the procedure and the sedation                            options and risks were discussed with the patient.                            All questions were answered, and informed consent                            was obtained. Prior Anticoagulants: The patient has                            taken no anticoagulant or antiplatelet agents. ASA                            Grade Assessment: III - A patient with severe                            systemic disease. After reviewing the risks and                            benefits, the patient was deemed in satisfactory  condition to undergo the procedure.                           After obtaining informed consent, the endoscope was                            passed under direct vision. Throughout the                            procedure, the patient's blood pressure, pulse, and                            oxygen saturations were monitored continuously. The                            GIF [id number] #[id number] was introduced through the                             mouth, and advanced to the second part of duodenum.                            The upper GI endoscopy was accomplished without                            difficulty. The patient tolerated the procedure                            well. Scope In: Scope Out: Findings:                 Esophagogastric landmarks were identified: the                            Z-line was found at 33 cm, the gastroesophageal                            junction was found at 34 cm and the upper extent of                            the gastric folds was found at 36 cm from the                            incisors.                           A 2 cm hiatal hernia was present.                           The Z-line was irregular and was found 33 cm from                            the incisors. No interval changes noted since the                            last  exam - suspected stable 1cm segment of                            Barrett's. Biopsies were taken with a cold forceps                            for histology.                           The exam of the esophagus was otherwise normal.                           The entire examined stomach was normal.                           The examined duodenum was normal. Complications:            No immediate complications. Estimated blood loss:                            Minimal. Estimated Blood Loss:     Estimated blood loss was minimal. Impression:               - Esophagogastric landmarks identified.                           - 2 cm hiatal hernia.                           - Z-line irregular, 33 cm from the incisors.                            Biopsied.                           - Normal stomach.                           - Normal examined duodenum.                           Overall, stable short segment Barrett's without                            interval changes, biopsies obtained. Recommendation:           - Patient has a contact number  available for                            emergencies. The signs and symptoms of potential                            delayed complications were discussed with the                            patient. Return to normal activities tomorrow.  Written discharge instructions were provided to the                            patient.                           - Resume previous diet.                           - Continue present medications.                           - Await pathology results. Elspeth P. Rayan Ines, MD 07/23/2024 10:16:44 AM This report has been signed electronically.

## 2024-07-24 ENCOUNTER — Telehealth: Payer: Self-pay | Admitting: *Deleted

## 2024-07-24 NOTE — Telephone Encounter (Signed)
" °  Follow up Call-     07/23/2024    9:12 AM  Call back number  Post procedure Call Back phone  # [phone]  Permission to leave phone message Yes     Patient questions:  Do you have a fever, pain , or abdominal swelling? No. Pain Score  0 *  Have you tolerated food without any problems? Yes.    Have you been able to return to your normal activities? Yes.    Do you have any questions about your discharge instructions: Diet   No. Medications  No. Follow up visit  No.  Do you have questions or concerns about your Care? No.  Actions: * If pain score is 4 or above: No action needed, pain <4.   "

## 2024-07-24 NOTE — Progress Notes (Shared)
 " [facility] - Clinic Note  08/04/2024     CHIEF COMPLAINT Patient presents for No chief complaint on file.   HISTORY OF PRESENT ILLNESS: Natasha Franco is a 74 y.o. female who presents to the clinic today for:     Pt states she's been compliant w/ using gtts BID OD. [facility] seems stable  Referring physician: [name] MD [address] [place], [place] [address]  HISTORICAL INFORMATION:   Selected notes from the MEDICAL RECORD NUMBER Referred by [name] for retinoschisis OD LEE:  Ocular Hx- HH plaque X2 OS (2018), cataracts, progressive retinoschisis OD PMH-    CURRENT MEDICATIONS: Current Outpatient Medications (Ophthalmic Drugs)  Medication Sig   ketorolac  (ACULAR ) 0.5 % ophthalmic solution Place 1 drop into the right eye daily.   prednisoLONE  acetate (PRED FORTE ) 1 % ophthalmic suspension Place 1 drop into the right eye daily.   timolol  (TIMOPTIC ) 0.5 % ophthalmic solution Place 1 drop into the right eye 2 (two) times daily.   No current facility-administered medications for this visit. (Ophthalmic Drugs)   Current Outpatient Medications (Other)  Medication Sig   Accu-Chek Softclix Lancets lancets Use to check blood sugar daily. Dx E11.9   albuterol (VENTOLIN HFA) 108 (90 Base) MCG/ACT inhaler INHALE 1 TO 2 PUFFS BY MOUTH EVERY 4 TO 6 HOURS AS NEEDED FOR COUGH OR WHEEZING   ALPRAZolam  (XANAX ) 0.5 MG tablet Take 1 tablet (0.5 mg total) by mouth 2 (two) times daily as needed.   azelastine  (ASTELIN ) 0.1 % nasal spray Place 2 sprays into both nostrils 2 (two) times daily. Use in each nostril as directed/PRN   Continuous Glucose Receiver (DEXCOM G7 RECEIVER) DEVI 1 each by Does not apply route daily.   Continuous Glucose Sensor (DEXCOM G7 SENSOR) MISC 1 each by Does not apply route at bedtime.   empagliflozin  (JARDIANCE ) 25 MG TABS tablet Take 1 tablet (25 mg total) by mouth every other day.   escitalopram  (LEXAPRO ) 20 MG tablet Take 1 tablet (20  mg total) by mouth at bedtime.   estradiol  (ESTRACE  VAGINAL) 0.1 MG/GM vaginal cream Place 1 Applicatorful vaginally 3 (three) times a week. (Patient not taking: No sig reported)   Evolocumab  (REPATHA  SURECLICK) 140 MG/ML SOAJ Inject 140 mg into the skin every 14 (fourteen) days.   fluticasone  (FLONASE ) 50 MCG/ACT nasal spray Place 2 sprays into both nostrils 2 (two) times daily. PRN   fluticasone -salmeterol (WIXELA INHUB) 250-50 MCG/ACT AEPB Inhale 1 puff into the lungs in the morning and at bedtime. PRN (Patient taking differently: Inhale 1 puff into the lungs 2 (two) times daily as needed. PRN)   glucose blood test strip Use as instructed to check blood sugar daily. Dx E11.9   ibuprofen  (ADVIL ) 800 MG tablet Take 1 tablet (800 mg total) by mouth 3 (three) times daily with meals as needed.   insulin  glargine (LANTUS  SOLOSTAR) 100 UNIT/ML Solostar Pen Inject 26 Units into the skin daily.   Insulin  Pen Needle (EMBECTA AUTOSHIELD DUO) 30G X 5 MM MISC Use daily with insulin  injection   ipratropium (ATROVENT ) 0.06 % nasal spray Place 2 sprays into both nostrils 3 (three) times daily as needed for rhinitis.   levocetirizine (XYZAL ) 5 MG tablet Take 1 tablet (5 mg total) by mouth every evening.   metFORMIN  (GLUCOPHAGE ) 500 MG tablet Take 1 tablet (500 mg total) by mouth at bedtime.   montelukast  (SINGULAIR ) 10 MG tablet Take 1 tablet (10 mg total) by mouth at bedtime.  ondansetron  (ZOFRAN ) 4 MG tablet Take 1 tablet (4 mg total) by mouth every 8 (eight) hours as needed for nausea or vomiting.   pantoprazole  (PROTONIX ) 40 MG tablet Take 1 tablet (40 mg total) by mouth 2 (two) times daily 30 minutes before meals.   topiramate  (TOPAMAX ) 100 MG tablet Take 1 tablet (100 mg total) by mouth at bedtime.   traZODone  (DESYREL ) 50 MG tablet Take 1 tablet (50 mg total) by mouth at bedtime.   ursodiol  (ACTIGALL ) 300 MG capsule Take 2 capsules (600 mg total) by mouth 2 (two) times daily.   No current  facility-administered medications for this visit. (Other)   REVIEW OF SYSTEMS:    ALLERGIES Allergies  Allergen Reactions   Bupropion Hcl Rash   Invokana [Canagliflozin] Other (See Comments)    VAGINAL YEAST INFECTIONS   Metformin  And Related Other (See Comments)    GI upset at higher doses   Ozempic  (0.25 Or 0.5 Mg-Dose) [Semaglutide (0.25 Or 0.5mg -Dos)] Other (See Comments)    GI intolerance.  Has tolerated trulicity  0.75mg  per dose.    Statins Other (See Comments)    LFT elevation   Trulicity  [Dulaglutide ] Other (See Comments)    History of elevated lipase   Versed  [Midazolam ] Other (See Comments)    Talkative after med use   Zetia [Ezetimibe] Cough   PAST MEDICAL HISTORY Past Medical History:  Diagnosis Date   Allergy    Anxiety    Arthritis    Barrett's esophagus    Cataract 07/12/2020   Bilateral cateract removal w/ IOL 03&04/23   Chicken pox    Complication of anesthesia    Diabetes mellitus without complication (HCC)    Diverticulosis    GERD (gastroesophageal reflux disease)    Hay fever    Hepatitis    Hyperlipidemia    Per pt   Incontinent of urine    Migraines    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   BLEPHAROPLASTY Bilateral    upper and lower   BREAST BIOPSY Left 2014   BREAST SURGERY Bilateral 1984   reduction mammoplasty   CARPAL TUNNEL RELEASE Right    CATARACT EXTRACTION     CESAREAN SECTION     x 2   EYE SURGERY  2023   Cataracts   FACIAL COSMETIC SURGERY     JOINT REPLACEMENT     KNEE ARTHROSCOPY Left    REDUCTION MAMMAPLASTY Bilateral    ROTATOR CUFF REPAIR Right    TONSILLECTOMY     TOTAL KNEE ARTHROPLASTY Left 03/25/2019   Procedure: Left Knee Arthroplasty;  Surgeon: Sheril Coy, MD;  Location: [facility];  Service: Orthopedics;  Laterality: Left;   TUBAL LIGATION      VAGINAL HYSTERECTOMY     FAMILY HISTORY Family History  Problem Relation Age of Onset   Arthritis Mother    Early death Father    Diabetes Father    Alcohol abuse Father    COPD Father    Heart attack Father    Mental illness Father    Alcohol abuse Sister    Stroke Maternal Grandmother    Stroke Maternal Grandfather    Diabetes Maternal Grandfather    Diabetes Paternal Grandfather    Anxiety disorder Daughter    Hypotension Daughter        POTS  Colon cancer Neg Hx    Liver cancer Neg Hx    Stomach cancer Neg Hx    Esophageal cancer Neg Hx    Rectal cancer Neg Hx    Breast cancer Neg Hx    SOCIAL HISTORY Social History   Tobacco Use   Smoking status: Former    Current packs/day: 0.00    Average packs/day: 1 pack/day for 25.0 years (25.0 ttl pk-yrs)    Types: Cigarettes    Start date: 09/01/1981    Quit date: 09/01/2006    Years since quitting: 17.9   Smokeless tobacco: Never  Vaping Use   Vaping status: Never Used  Substance Use Topics   Alcohol use: Yes    Comment: occ   Drug use: No       OPHTHALMIC EXAM:  Not recorded    IMAGING AND PROCEDURES  Imaging and Procedures for 08/04/2024          ASSESSMENT/PLAN:    ICD-10-CM   1. 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 Diabetes mellitus type 2 without retinopathy (HCC)  E11.9     5. Long term (current) use of oral hypoglycemic drugs  Z79.84     6. Long-term (current) use of injectable non-insulin  antidiabetic drugs  Z79.85     7. Pseudophakia, both eyes  Z96.1     8. Dry eyes  H04.123       1. CME OD - development of IRF and sliver of SRF centrally -- consistent with CME, noted on 07.31.23 visit - suspect delayed [place]-Gass, s/p CEIOL with Dr. Leslee in March 2023 - started on PF and Ketorolac  QID OD on 07.31.23 - recurrent CME following taper after 08.22.23 visit - referred back 07.10.25 by  [name] for recurrent CME -- noted on 06.10.25 and pt was started on PF and timolol  - s/p STK OD #1 (11.15.23), #2 (08.07.25) - today, OCT shows stable resolution of cystic changes inferior macula -- on PF/Ket BID - BCVA OD 20/25 stable - recommend reducing PF and Ketorolac  from BID to once daily OD, no STK today.  - f/u in 2-18months - DFE/OCT  2,3. Retinoschisis OU - OD: inferior schisis 0400-0730 with pigment clumping and pigment atrophy within schisis cavity, outer retinal hole at 0700 - OS: very shallow schisis IT arcades  - pt reports intermittent photopsias and floaters OU -- likely unrelated to schisis - s/p laser retinopexy OD (01.30.23) -- good barricade laser changes posterior to schisis - no new RT/RD or schisis OU - monitor   4-6. Diabetes mellitus, type 2 without retinopathy  - A1c was 7.2 (08.01.25), 6.5 (2.27.24) - The incidence, risk factors for progression, natural history and treatment options for diabetic retinopathy  were discussed with patient.   - The need for close monitoring of blood glucose, blood pressure, and serum lipids, avoiding cigarette or any type of tobacco, and the need for long term follow up was also discussed with patient. - f/u in 1 year, sooner prn  7. Pseudophakia OU  - s/p CE/IOL OU (Dr. Leslee, March / April 2023)  - IOLs in perfect position, doing well  - mild CME OD stably resolved as above  - monitor   8. Dry eyes OU (OD > OS) - recommend artificial tears and lubricating ointment as needed  Ophthalmic Meds Ordered this visit:  No  orders of the defined types were placed in this encounter.    No follow-ups on file.  There are no Patient Instructions on file for this visit.   Explained the diagnoses, plan, and follow up with the patient and they expressed understanding.  Patient expressed understanding of the importance of proper follow up care.   This document serves as a record of services personally performed by Redell JUDITHANN Hans, MD, PhD. It was created on their behalf by Paulina Jamse Gay an ophthalmic technician. The creation of this record is the provider's dictation and/or activities during the visit.   Electronically signed by: Paulina JONETTA Gay  07/24/24  7:31 AM   Redell JUDITHANN Hans, M.D., Ph.D. Diseases & Surgery of the Retina and Vitreous [facility]   Abbreviations: M myopia (nearsighted); A astigmatism; H hyperopia (farsighted); P presbyopia; Mrx spectacle prescription;  CTL contact lenses; OD right eye; OS left eye; OU both eyes  XT exotropia; ET esotropia; PEK punctate epithelial keratitis; PEE punctate epithelial erosions; DES dry eye syndrome; MGD meibomian gland dysfunction; ATs artificial tears; PFAT's preservative free artificial tears; NSC nuclear sclerotic cataract; PSC posterior subcapsular cataract; ERM epi-retinal membrane; PVD posterior vitreous detachment; RD retinal detachment; DM diabetes mellitus; DR diabetic retinopathy; NPDR non-proliferative diabetic retinopathy; PDR proliferative diabetic retinopathy; CSME clinically significant macular edema; DME diabetic macular edema; dbh dot blot hemorrhages; CWS cotton wool spot; POAG primary open angle glaucoma; C/D cup-to-disc ratio; HVF humphrey visual field; GVF goldmann visual field; OCT optical coherence tomography; IOP intraocular pressure; BRVO Branch retinal vein occlusion; CRVO central retinal vein occlusion; CRAO central retinal artery occlusion; BRAO branch retinal artery occlusion; RT retinal tear; SB scleral buckle; PPV pars plana vitrectomy; VH Vitreous hemorrhage; PRP panretinal laser photocoagulation; IVK intravitreal kenalog ; VMT vitreomacular traction; MH Macular hole;  NVD neovascularization of the disc; NVE neovascularization elsewhere; AREDS age related eye disease study; ARMD age related macular degeneration; POAG primary open angle glaucoma; EBMD epithelial/anterior basement membrane dystrophy; ACIOL anterior  chamber intraocular lens; IOL intraocular lens; PCIOL posterior chamber intraocular lens; Phaco/IOL phacoemulsification with intraocular lens placement; PRK photorefractive keratectomy; LASIK laser assisted in situ keratomileusis; HTN hypertension; DM diabetes mellitus; COPD chronic obstructive pulmonary disease "

## 2024-07-25 ENCOUNTER — Ambulatory Visit: Payer: Self-pay | Admitting: Gastroenterology

## 2024-07-25 LAB — SURGICAL PATHOLOGY

## 2024-07-30 ENCOUNTER — Telehealth: Payer: Self-pay

## 2024-07-30 DIAGNOSIS — K760 Fatty (change of) liver, not elsewhere classified: Secondary | ICD-10-CM

## 2024-07-30 DIAGNOSIS — R748 Abnormal levels of other serum enzymes: Secondary | ICD-10-CM

## 2024-07-30 NOTE — Telephone Encounter (Signed)
 Order placed. MyChart to patient to go to the lab

## 2024-07-30 NOTE — Telephone Encounter (Signed)
-----   Message from [facility] Cetronia H sent at 02/04/2024 12:49 PM EDT ----- Regarding: LFTs Patient will but due for LFTs in late Jan 2026 - one day next week.  Place order

## 2024-08-04 ENCOUNTER — Encounter (INDEPENDENT_AMBULATORY_CARE_PROVIDER_SITE_OTHER): Admitting: Ophthalmology

## 2024-08-04 DIAGNOSIS — H33103 Unspecified retinoschisis, bilateral: Secondary | ICD-10-CM

## 2024-08-04 DIAGNOSIS — Z7985 Long-term (current) use of injectable non-insulin antidiabetic drugs: Secondary | ICD-10-CM

## 2024-08-04 DIAGNOSIS — Z961 Presence of intraocular lens: Secondary | ICD-10-CM

## 2024-08-04 DIAGNOSIS — H35351 Cystoid macular degeneration, right eye: Secondary | ICD-10-CM

## 2024-08-04 DIAGNOSIS — H04123 Dry eye syndrome of bilateral lacrimal glands: Secondary | ICD-10-CM

## 2024-08-04 DIAGNOSIS — H33321 Round hole, right eye: Secondary | ICD-10-CM

## 2024-08-04 DIAGNOSIS — E119 Type 2 diabetes mellitus without complications: Secondary | ICD-10-CM

## 2024-08-04 DIAGNOSIS — Z7984 Long term (current) use of oral hypoglycemic drugs: Secondary | ICD-10-CM

## 2024-08-04 MED FILL — Evolocumab Subcutaneous Soln Auto-Injector 140 MG/ML: SUBCUTANEOUS | 28 days supply | Qty: 2 | Fill #6 | Status: AC

## 2024-08-05 ENCOUNTER — Other Ambulatory Visit: Payer: Self-pay

## 2024-08-05 NOTE — Telephone Encounter (Signed)
 Called and spoke to patient.  She would like to go to [place] for her labs since she lives in [place]. Order modified

## 2024-08-05 NOTE — Addendum Note (Signed)
 Addended by: Nickolette Espinola M on: 08/05/2024 09:43 AM   Modules accepted: Orders

## 2024-08-06 ENCOUNTER — Other Ambulatory Visit: Payer: Self-pay

## 2024-08-06 ENCOUNTER — Other Ambulatory Visit (HOSPITAL_COMMUNITY): Payer: Self-pay

## 2024-08-08 ENCOUNTER — Ambulatory Visit (INDEPENDENT_AMBULATORY_CARE_PROVIDER_SITE_OTHER): Admitting: Ophthalmology

## 2024-08-08 ENCOUNTER — Encounter (INDEPENDENT_AMBULATORY_CARE_PROVIDER_SITE_OTHER): Payer: Self-pay | Admitting: Ophthalmology

## 2024-08-08 ENCOUNTER — Other Ambulatory Visit
Admission: RE | Admit: 2024-08-08 | Discharge: 2024-08-08 | Disposition: A | Attending: Gastroenterology | Admitting: Gastroenterology

## 2024-08-08 ENCOUNTER — Other Ambulatory Visit: Payer: Self-pay

## 2024-08-08 DIAGNOSIS — H04123 Dry eye syndrome of bilateral lacrimal glands: Secondary | ICD-10-CM | POA: Diagnosis not present

## 2024-08-08 DIAGNOSIS — Z7984 Long term (current) use of oral hypoglycemic drugs: Secondary | ICD-10-CM

## 2024-08-08 DIAGNOSIS — H33103 Unspecified retinoschisis, bilateral: Secondary | ICD-10-CM | POA: Diagnosis not present

## 2024-08-08 DIAGNOSIS — H35351 Cystoid macular degeneration, right eye: Secondary | ICD-10-CM | POA: Diagnosis not present

## 2024-08-08 DIAGNOSIS — K760 Fatty (change of) liver, not elsewhere classified: Secondary | ICD-10-CM | POA: Diagnosis present

## 2024-08-08 DIAGNOSIS — E119 Type 2 diabetes mellitus without complications: Secondary | ICD-10-CM | POA: Diagnosis not present

## 2024-08-08 DIAGNOSIS — Z961 Presence of intraocular lens: Secondary | ICD-10-CM

## 2024-08-08 DIAGNOSIS — H40051 Ocular hypertension, right eye: Secondary | ICD-10-CM | POA: Diagnosis not present

## 2024-08-08 DIAGNOSIS — Z7985 Long-term (current) use of injectable non-insulin antidiabetic drugs: Secondary | ICD-10-CM

## 2024-08-08 DIAGNOSIS — R748 Abnormal levels of other serum enzymes: Secondary | ICD-10-CM | POA: Diagnosis present

## 2024-08-08 DIAGNOSIS — H33321 Round hole, right eye: Secondary | ICD-10-CM | POA: Diagnosis not present

## 2024-08-08 LAB — HEPATIC FUNCTION PANEL
ALT: 38 U/L (ref 0–44)
AST: 29 U/L (ref 15–41)
Albumin: 4.2 g/dL (ref 3.5–5.0)
Alkaline Phosphatase: 112 U/L (ref 38–126)
Bilirubin, Direct: 0.3 mg/dL — ABNORMAL HIGH (ref 0.0–0.2)
Indirect Bilirubin: 0.3 mg/dL (ref 0.3–0.9)
Total Bilirubin: 0.6 mg/dL (ref 0.0–1.2)
Total Protein: 7.3 g/dL (ref 6.5–8.1)

## 2024-08-08 MED ORDER — BRIMONIDINE TARTRATE 0.2 % OP SOLN
1.0000 [drp] | Freq: Two times a day (BID) | OPHTHALMIC | 3 refills | Status: AC
  Filled 2024-08-08: qty 5, 50d supply, fill #0

## 2024-08-08 NOTE — Addendum Note (Signed)
 Addended by: ALEX SLOUGH C on: 08/08/2024 10:21 AM   Modules accepted: Orders

## 2024-08-11 ENCOUNTER — Other Ambulatory Visit (HOSPITAL_COMMUNITY): Payer: Self-pay

## 2024-08-11 ENCOUNTER — Ambulatory Visit: Payer: Self-pay | Admitting: Gastroenterology

## 2024-08-11 ENCOUNTER — Other Ambulatory Visit: Payer: Self-pay

## 2024-08-11 DIAGNOSIS — K743 Primary biliary cirrhosis: Secondary | ICD-10-CM

## 2024-08-11 DIAGNOSIS — K760 Fatty (change of) liver, not elsewhere classified: Secondary | ICD-10-CM

## 2024-08-12 NOTE — Telephone Encounter (Signed)
 Lab orders placed in EPIC to be completed around 02/2025.

## 2024-10-17 ENCOUNTER — Ambulatory Visit: Admitting: Family Medicine

## 2024-11-26 ENCOUNTER — Encounter (INDEPENDENT_AMBULATORY_CARE_PROVIDER_SITE_OTHER): Admitting: Ophthalmology
